# Patient Record
Sex: Male | Born: 1965 | Race: White | Hispanic: No | Marital: Married | Smoking: Former smoker
Health system: Southern US, Community
[De-identification: ages and names within clinical notes are randomized; demographics above are authoritative.]

## PROBLEM LIST (undated history)

## (undated) DIAGNOSIS — E291 Testicular hypofunction: Secondary | ICD-10-CM

## (undated) DIAGNOSIS — Q638 Other specified congenital malformations of kidney: Secondary | ICD-10-CM

## (undated) DIAGNOSIS — N529 Male erectile dysfunction, unspecified: Secondary | ICD-10-CM

## (undated) DIAGNOSIS — N400 Enlarged prostate without lower urinary tract symptoms: Secondary | ICD-10-CM

## (undated) DIAGNOSIS — Q632 Ectopic kidney: Secondary | ICD-10-CM

## (undated) DIAGNOSIS — E669 Obesity, unspecified: Secondary | ICD-10-CM

## (undated) DIAGNOSIS — K219 Gastro-esophageal reflux disease without esophagitis: Secondary | ICD-10-CM

## (undated) DIAGNOSIS — Z940 Kidney transplant status: Secondary | ICD-10-CM

## (undated) DIAGNOSIS — G4730 Sleep apnea, unspecified: Secondary | ICD-10-CM

## (undated) DIAGNOSIS — IMO0001 Reserved for inherently not codable concepts without codable children: Secondary | ICD-10-CM

## (undated) DIAGNOSIS — D472 Monoclonal gammopathy: Secondary | ICD-10-CM

## (undated) DIAGNOSIS — I10 Essential (primary) hypertension: Secondary | ICD-10-CM

## (undated) DIAGNOSIS — F524 Premature ejaculation: Secondary | ICD-10-CM

## (undated) DIAGNOSIS — N3289 Other specified disorders of bladder: Secondary | ICD-10-CM

## (undated) DIAGNOSIS — K439 Ventral hernia without obstruction or gangrene: Secondary | ICD-10-CM

## (undated) HISTORY — DX: Male erectile dysfunction, unspecified: N52.9

## (undated) HISTORY — DX: Testicular hypofunction: E29.1

## (undated) HISTORY — DX: Benign prostatic hyperplasia without lower urinary tract symptoms: N40.0

## (undated) HISTORY — DX: Other specified congenital malformations of kidney: Q63.8

## (undated) HISTORY — DX: Sleep apnea, unspecified: G47.30

## (undated) HISTORY — DX: Monoclonal gammopathy: D47.2

## (undated) HISTORY — PX: COLONOSCOPY: SHX174

## (undated) HISTORY — DX: Kidney transplant status: Z94.0

## (undated) HISTORY — DX: Obesity, unspecified: E66.9

## (undated) HISTORY — DX: Premature ejaculation: F52.4

## (undated) HISTORY — DX: Ectopic kidney: Q63.2

## (undated) HISTORY — PX: OTHER SURGICAL HISTORY: SHX169

## (undated) HISTORY — DX: Reserved for inherently not codable concepts without codable children: IMO0001

## (undated) HISTORY — DX: Other specified disorders of bladder: N32.89

## (undated) HISTORY — DX: Ventral hernia without obstruction or gangrene: K43.9

## (undated) HISTORY — DX: Essential (primary) hypertension: I10

## (undated) HISTORY — PX: BACK SURGERY: SHX140

## (undated) HISTORY — PX: SPINE SURGERY: SHX786

---

## 2005-12-04 ENCOUNTER — Ambulatory Visit: Payer: Self-pay | Admitting: Unknown Physician Specialty

## 2005-12-18 ENCOUNTER — Ambulatory Visit: Payer: Self-pay | Admitting: Unknown Physician Specialty

## 2006-01-01 ENCOUNTER — Ambulatory Visit: Payer: Self-pay | Admitting: Unknown Physician Specialty

## 2006-01-10 ENCOUNTER — Inpatient Hospital Stay: Payer: Self-pay | Admitting: Unknown Physician Specialty

## 2006-10-02 ENCOUNTER — Ambulatory Visit: Payer: Self-pay | Admitting: Physician Assistant

## 2007-01-13 ENCOUNTER — Emergency Department: Payer: Self-pay | Admitting: Emergency Medicine

## 2007-01-19 ENCOUNTER — Ambulatory Visit: Payer: Self-pay

## 2007-01-23 ENCOUNTER — Ambulatory Visit (HOSPITAL_COMMUNITY): Admission: RE | Admit: 2007-01-23 | Discharge: 2007-01-23 | Payer: Self-pay | Admitting: Neurological Surgery

## 2007-02-10 ENCOUNTER — Inpatient Hospital Stay (HOSPITAL_COMMUNITY): Admission: RE | Admit: 2007-02-10 | Discharge: 2007-02-11 | Payer: Self-pay | Admitting: Neurological Surgery

## 2007-06-24 ENCOUNTER — Ambulatory Visit: Payer: Self-pay | Admitting: Urology

## 2009-04-05 ENCOUNTER — Ambulatory Visit: Payer: Self-pay | Admitting: Unknown Physician Specialty

## 2010-06-22 ENCOUNTER — Ambulatory Visit: Payer: Self-pay | Admitting: Oncology

## 2010-06-26 ENCOUNTER — Ambulatory Visit: Payer: Self-pay | Admitting: Nephrology

## 2010-07-06 ENCOUNTER — Ambulatory Visit: Payer: Self-pay | Admitting: Oncology

## 2010-07-23 ENCOUNTER — Ambulatory Visit: Payer: Self-pay | Admitting: Oncology

## 2010-07-27 ENCOUNTER — Ambulatory Visit: Payer: Self-pay | Admitting: Nephrology

## 2010-08-23 ENCOUNTER — Ambulatory Visit: Payer: Self-pay | Admitting: Oncology

## 2010-08-24 ENCOUNTER — Ambulatory Visit: Payer: Self-pay | Admitting: Oncology

## 2010-09-05 DIAGNOSIS — I10 Essential (primary) hypertension: Secondary | ICD-10-CM | POA: Insufficient documentation

## 2010-09-22 ENCOUNTER — Ambulatory Visit: Payer: Self-pay | Admitting: Oncology

## 2010-10-23 ENCOUNTER — Ambulatory Visit: Payer: Self-pay | Admitting: Oncology

## 2010-12-29 ENCOUNTER — Ambulatory Visit: Payer: Self-pay | Admitting: Family Medicine

## 2011-02-12 DIAGNOSIS — G4733 Obstructive sleep apnea (adult) (pediatric): Secondary | ICD-10-CM | POA: Insufficient documentation

## 2011-02-12 DIAGNOSIS — E66811 Obesity, class 1: Secondary | ICD-10-CM | POA: Insufficient documentation

## 2011-02-12 DIAGNOSIS — E669 Obesity, unspecified: Secondary | ICD-10-CM | POA: Insufficient documentation

## 2011-02-12 DIAGNOSIS — E662 Morbid (severe) obesity with alveolar hypoventilation: Secondary | ICD-10-CM | POA: Insufficient documentation

## 2011-02-27 ENCOUNTER — Ambulatory Visit: Payer: Self-pay | Admitting: Family Medicine

## 2011-03-04 ENCOUNTER — Ambulatory Visit: Payer: Self-pay | Admitting: Oncology

## 2011-03-24 ENCOUNTER — Ambulatory Visit: Payer: Self-pay | Admitting: Family Medicine

## 2011-03-24 ENCOUNTER — Ambulatory Visit: Payer: Self-pay | Admitting: Oncology

## 2011-04-09 ENCOUNTER — Ambulatory Visit: Payer: Self-pay | Admitting: Vascular Surgery

## 2011-04-12 ENCOUNTER — Ambulatory Visit: Payer: Self-pay | Admitting: Vascular Surgery

## 2011-05-21 ENCOUNTER — Ambulatory Visit: Payer: Self-pay | Admitting: Vascular Surgery

## 2011-08-23 ENCOUNTER — Other Ambulatory Visit: Payer: Self-pay | Admitting: Nephrology

## 2011-12-25 DIAGNOSIS — Z7682 Awaiting organ transplant status: Secondary | ICD-10-CM | POA: Diagnosis not present

## 2011-12-25 DIAGNOSIS — Z01818 Encounter for other preprocedural examination: Secondary | ICD-10-CM | POA: Diagnosis not present

## 2011-12-25 DIAGNOSIS — N186 End stage renal disease: Secondary | ICD-10-CM | POA: Diagnosis not present

## 2011-12-30 ENCOUNTER — Ambulatory Visit: Payer: Self-pay | Admitting: Urology

## 2011-12-30 DIAGNOSIS — R109 Unspecified abdominal pain: Secondary | ICD-10-CM | POA: Diagnosis not present

## 2012-01-07 ENCOUNTER — Other Ambulatory Visit: Payer: Self-pay | Admitting: Nephrology

## 2012-01-07 DIAGNOSIS — R109 Unspecified abdominal pain: Secondary | ICD-10-CM | POA: Diagnosis not present

## 2012-01-07 DIAGNOSIS — Z0189 Encounter for other specified special examinations: Secondary | ICD-10-CM | POA: Diagnosis not present

## 2012-01-07 DIAGNOSIS — I120 Hypertensive chronic kidney disease with stage 5 chronic kidney disease or end stage renal disease: Secondary | ICD-10-CM | POA: Diagnosis not present

## 2012-01-07 DIAGNOSIS — N186 End stage renal disease: Secondary | ICD-10-CM | POA: Diagnosis not present

## 2012-01-07 DIAGNOSIS — K5730 Diverticulosis of large intestine without perforation or abscess without bleeding: Secondary | ICD-10-CM | POA: Diagnosis not present

## 2012-01-07 DIAGNOSIS — R161 Splenomegaly, not elsewhere classified: Secondary | ICD-10-CM | POA: Diagnosis not present

## 2012-01-11 LAB — BODY FLUID CULTURE

## 2012-01-13 ENCOUNTER — Other Ambulatory Visit: Payer: Self-pay

## 2012-01-17 ENCOUNTER — Ambulatory Visit: Payer: Self-pay

## 2012-01-20 DIAGNOSIS — N032 Chronic nephritic syndrome with diffuse membranous glomerulonephritis: Secondary | ICD-10-CM | POA: Diagnosis not present

## 2012-01-20 DIAGNOSIS — N186 End stage renal disease: Secondary | ICD-10-CM | POA: Diagnosis not present

## 2012-01-20 DIAGNOSIS — N189 Chronic kidney disease, unspecified: Secondary | ICD-10-CM | POA: Diagnosis not present

## 2012-01-20 DIAGNOSIS — Z01818 Encounter for other preprocedural examination: Secondary | ICD-10-CM | POA: Diagnosis not present

## 2012-01-20 DIAGNOSIS — I10 Essential (primary) hypertension: Secondary | ICD-10-CM | POA: Diagnosis not present

## 2012-01-20 DIAGNOSIS — Z01812 Encounter for preprocedural laboratory examination: Secondary | ICD-10-CM | POA: Diagnosis not present

## 2012-01-20 DIAGNOSIS — K219 Gastro-esophageal reflux disease without esophagitis: Secondary | ICD-10-CM | POA: Diagnosis not present

## 2012-01-20 DIAGNOSIS — I120 Hypertensive chronic kidney disease with stage 5 chronic kidney disease or end stage renal disease: Secondary | ICD-10-CM | POA: Diagnosis not present

## 2012-01-20 DIAGNOSIS — Z01810 Encounter for preprocedural cardiovascular examination: Secondary | ICD-10-CM | POA: Diagnosis not present

## 2012-01-20 DIAGNOSIS — Z992 Dependence on renal dialysis: Secondary | ICD-10-CM | POA: Diagnosis not present

## 2012-01-23 ENCOUNTER — Other Ambulatory Visit: Payer: Self-pay

## 2012-01-23 DIAGNOSIS — I120 Hypertensive chronic kidney disease with stage 5 chronic kidney disease or end stage renal disease: Secondary | ICD-10-CM | POA: Diagnosis not present

## 2012-01-23 DIAGNOSIS — Z992 Dependence on renal dialysis: Secondary | ICD-10-CM | POA: Diagnosis not present

## 2012-01-23 DIAGNOSIS — Z01818 Encounter for other preprocedural examination: Secondary | ICD-10-CM | POA: Diagnosis not present

## 2012-01-23 DIAGNOSIS — Z01810 Encounter for preprocedural cardiovascular examination: Secondary | ICD-10-CM | POA: Diagnosis not present

## 2012-01-23 DIAGNOSIS — Z01812 Encounter for preprocedural laboratory examination: Secondary | ICD-10-CM | POA: Diagnosis not present

## 2012-01-23 DIAGNOSIS — N186 End stage renal disease: Secondary | ICD-10-CM | POA: Diagnosis not present

## 2012-01-23 LAB — BODY FLUID CELL COUNT WITH DIFFERENTIAL: Nucleated Cell Count: 27 /mm3

## 2012-01-24 DIAGNOSIS — N186 End stage renal disease: Secondary | ICD-10-CM | POA: Diagnosis not present

## 2012-01-24 HISTORY — PX: KIDNEY TRANSPLANT: SHX239

## 2012-01-27 LAB — BODY FLUID CULTURE

## 2012-01-28 DIAGNOSIS — I120 Hypertensive chronic kidney disease with stage 5 chronic kidney disease or end stage renal disease: Secondary | ICD-10-CM | POA: Diagnosis present

## 2012-01-28 DIAGNOSIS — N186 End stage renal disease: Secondary | ICD-10-CM | POA: Diagnosis present

## 2012-01-28 DIAGNOSIS — N042 Nephrotic syndrome with diffuse membranous glomerulonephritis: Secondary | ICD-10-CM | POA: Diagnosis present

## 2012-01-28 DIAGNOSIS — Z992 Dependence on renal dialysis: Secondary | ICD-10-CM | POA: Diagnosis not present

## 2012-01-28 DIAGNOSIS — E669 Obesity, unspecified: Secondary | ICD-10-CM | POA: Diagnosis present

## 2012-01-28 DIAGNOSIS — E785 Hyperlipidemia, unspecified: Secondary | ICD-10-CM | POA: Diagnosis present

## 2012-01-28 DIAGNOSIS — G4733 Obstructive sleep apnea (adult) (pediatric): Secondary | ICD-10-CM | POA: Diagnosis present

## 2012-01-28 DIAGNOSIS — Z940 Kidney transplant status: Secondary | ICD-10-CM | POA: Insufficient documentation

## 2012-02-03 ENCOUNTER — Other Ambulatory Visit: Payer: Self-pay

## 2012-02-03 DIAGNOSIS — Z940 Kidney transplant status: Secondary | ICD-10-CM | POA: Diagnosis not present

## 2012-02-03 LAB — CBC WITH DIFFERENTIAL/PLATELET
Basophil %: 0 %
Eosinophil %: 0.3 %
HCT: 28.5 % — ABNORMAL LOW (ref 40.0–52.0)
Lymphocyte #: 0 10*3/uL — ABNORMAL LOW (ref 1.0–3.6)
MCV: 94 fL (ref 80–100)
Monocyte %: 2 %
Platelet: 195 10*3/uL (ref 150–440)
WBC: 5.4 10*3/uL (ref 3.8–10.6)

## 2012-02-03 LAB — PHOSPHORUS: Phosphorus: 1.6 mg/dL — ABNORMAL LOW (ref 2.5–4.9)

## 2012-02-03 LAB — BASIC METABOLIC PANEL
Anion Gap: 5 — ABNORMAL LOW (ref 7–16)
Chloride: 110 mmol/L — ABNORMAL HIGH (ref 98–107)
Co2: 23 mmol/L (ref 21–32)
Creatinine: 1.41 mg/dL — ABNORMAL HIGH (ref 0.60–1.30)
Osmolality: 277 (ref 275–301)

## 2012-02-03 LAB — MAGNESIUM: Magnesium: 1.7 mg/dL — ABNORMAL LOW

## 2012-02-05 DIAGNOSIS — Z940 Kidney transplant status: Secondary | ICD-10-CM | POA: Diagnosis not present

## 2012-02-05 LAB — BASIC METABOLIC PANEL
Creatinine: 1.25 mg/dL (ref 0.60–1.30)
EGFR (Non-African Amer.): >60
Glucose: 86 mg/dL (ref 65–99)
Osmolality: 277 (ref 275–301)

## 2012-02-05 LAB — CBC WITH DIFFERENTIAL/PLATELET
Basophil %: 0 %
Eosinophil #: 0 10*3/uL (ref 0.0–0.7)
Eosinophil %: 0.4 %
HCT: 29.1 % — ABNORMAL LOW (ref 40.0–52.0)
HGB: 9.8 g/dL — ABNORMAL LOW (ref 13.0–18.0)
MCV: 95 fL (ref 80–100)
Monocyte #: 0.2 10*3/uL (ref 0.0–0.7)
Monocyte %: 3.5 %
Platelet: 203 10*3/uL (ref 150–440)
RBC: 3.05 10*6/uL — ABNORMAL LOW (ref 4.40–5.90)
WBC: 5 10*3/uL (ref 3.8–10.6)

## 2012-02-05 LAB — MAGNESIUM: Magnesium: 1.4 mg/dL — ABNORMAL LOW

## 2012-02-06 DIAGNOSIS — T8640 Unspecified complication of liver transplant: Secondary | ICD-10-CM | POA: Diagnosis not present

## 2012-02-06 DIAGNOSIS — Z79899 Other long term (current) drug therapy: Secondary | ICD-10-CM | POA: Diagnosis not present

## 2012-02-06 DIAGNOSIS — Z298 Encounter for other specified prophylactic measures: Secondary | ICD-10-CM | POA: Diagnosis not present

## 2012-02-06 DIAGNOSIS — Z5181 Encounter for therapeutic drug level monitoring: Secondary | ICD-10-CM | POA: Diagnosis not present

## 2012-02-06 DIAGNOSIS — E875 Hyperkalemia: Secondary | ICD-10-CM | POA: Diagnosis not present

## 2012-02-06 DIAGNOSIS — Z940 Kidney transplant status: Secondary | ICD-10-CM | POA: Diagnosis not present

## 2012-02-06 DIAGNOSIS — Z48298 Encounter for aftercare following other organ transplant: Secondary | ICD-10-CM | POA: Diagnosis not present

## 2012-02-06 DIAGNOSIS — I10 Essential (primary) hypertension: Secondary | ICD-10-CM | POA: Diagnosis not present

## 2012-02-07 DIAGNOSIS — Z940 Kidney transplant status: Secondary | ICD-10-CM | POA: Diagnosis not present

## 2012-02-07 LAB — CBC WITH DIFFERENTIAL/PLATELET
Basophil %: 0 %
Eosinophil %: 0.4 %
HCT: 29 % — ABNORMAL LOW (ref 40.0–52.0)
Lymphocyte #: 0.1 10*3/uL — ABNORMAL LOW (ref 1.0–3.6)
MCH: 31.9 pg (ref 26.0–34.0)
MCV: 96 fL (ref 80–100)
Monocyte #: 0.2 10*3/uL (ref 0.0–0.7)
Neutrophil #: 3.9 10*3/uL (ref 1.4–6.5)
Platelet: 217 10*3/uL (ref 150–440)

## 2012-02-07 LAB — BASIC METABOLIC PANEL
Anion Gap: 7 (ref 7–16)
Calcium, Total: 8.8 mg/dL (ref 8.5–10.1)
Co2: 25 mmol/L (ref 21–32)
EGFR (African American): >60
Osmolality: 282 (ref 275–301)

## 2012-02-07 LAB — PHOSPHORUS: Phosphorus: 1.8 mg/dL — ABNORMAL LOW (ref 2.5–4.9)

## 2012-02-07 LAB — MAGNESIUM: Magnesium: 1.4 mg/dL — ABNORMAL LOW

## 2012-02-10 DIAGNOSIS — Z5181 Encounter for therapeutic drug level monitoring: Secondary | ICD-10-CM | POA: Diagnosis not present

## 2012-02-10 DIAGNOSIS — Z79899 Other long term (current) drug therapy: Secondary | ICD-10-CM | POA: Diagnosis not present

## 2012-02-10 DIAGNOSIS — Z940 Kidney transplant status: Secondary | ICD-10-CM | POA: Diagnosis not present

## 2012-02-10 LAB — CBC WITH DIFFERENTIAL/PLATELET
Basophil %: 0.1 %
Eosinophil %: 0.6 %
HGB: 9.6 g/dL — ABNORMAL LOW (ref 13.0–18.0)
Lymphocyte #: 0.1 10*3/uL — ABNORMAL LOW (ref 1.0–3.6)
MCH: 32.3 pg (ref 26.0–34.0)
MCV: 94 fL (ref 80–100)
Monocyte #: 0.2 10*3/uL (ref 0.0–0.7)
Neutrophil #: 4.4 10*3/uL (ref 1.4–6.5)
RBC: 2.99 10*6/uL — ABNORMAL LOW (ref 4.40–5.90)

## 2012-02-10 LAB — BASIC METABOLIC PANEL
BUN: 21 mg/dL — ABNORMAL HIGH (ref 7–18)
Chloride: 106 mmol/L (ref 98–107)
EGFR (Non-African Amer.): 46 — ABNORMAL LOW
Glucose: 101 mg/dL — ABNORMAL HIGH (ref 65–99)
Osmolality: 279 (ref 275–301)
Potassium: 5 mmol/L (ref 3.5–5.1)
Sodium: 138 mmol/L (ref 136–145)

## 2012-02-12 DIAGNOSIS — Z940 Kidney transplant status: Secondary | ICD-10-CM | POA: Diagnosis not present

## 2012-02-12 LAB — PHOSPHORUS: Phosphorus: 2.1 mg/dL — ABNORMAL LOW (ref 2.5–4.9)

## 2012-02-12 LAB — BASIC METABOLIC PANEL
BUN: 19 mg/dL — ABNORMAL HIGH (ref 7–18)
EGFR (African American): 43 — ABNORMAL LOW
EGFR (Non-African Amer.): 35 — ABNORMAL LOW
Glucose: 90 mg/dL (ref 65–99)
Osmolality: 276 (ref 275–301)
Potassium: 5.6 mmol/L — ABNORMAL HIGH (ref 3.5–5.1)
Sodium: 137 mmol/L (ref 136–145)

## 2012-02-12 LAB — CBC WITH DIFFERENTIAL/PLATELET
Basophil #: 0 10*3/uL (ref 0.0–0.1)
Eosinophil %: 1.3 %
HCT: 27.2 % — ABNORMAL LOW (ref 40.0–52.0)
Lymphocyte %: 2.3 %
MCH: 31.8 pg (ref 26.0–34.0)
MCV: 93 fL (ref 80–100)
Monocyte %: 6.8 %
Neutrophil #: 2.9 10*3/uL (ref 1.4–6.5)
Neutrophil %: 88.9 %
Platelet: 222 10*3/uL (ref 150–440)
RBC: 2.91 10*6/uL — ABNORMAL LOW (ref 4.40–5.90)
RDW: 13.6 % (ref 11.5–14.5)

## 2012-02-12 LAB — MAGNESIUM: Magnesium: 1.5 mg/dL — ABNORMAL LOW

## 2012-02-14 DIAGNOSIS — Z940 Kidney transplant status: Secondary | ICD-10-CM | POA: Diagnosis not present

## 2012-02-14 LAB — BASIC METABOLIC PANEL
Anion Gap: 11 (ref 7–16)
BUN: 25 mg/dL — ABNORMAL HIGH (ref 7–18)
Creatinine: 2.17 mg/dL — ABNORMAL HIGH (ref 0.60–1.30)
EGFR (African American): 42 — ABNORMAL LOW
EGFR (Non-African Amer.): 35 — ABNORMAL LOW
Glucose: 102 mg/dL — ABNORMAL HIGH (ref 65–99)
Sodium: 140 mmol/L (ref 136–145)

## 2012-02-14 LAB — CBC WITH DIFFERENTIAL/PLATELET
Basophil #: 0 10*3/uL (ref 0.0–0.1)
Basophil %: 1.1 %
Eosinophil #: 0.1 10*3/uL (ref 0.0–0.7)
Eosinophil %: 2.1 %
HCT: 27.5 % — ABNORMAL LOW (ref 40.0–52.0)
HGB: 9.3 g/dL — ABNORMAL LOW (ref 13.0–18.0)
Lymphocyte %: 4.7 %
MCH: 31.8 pg (ref 26.0–34.0)
MCHC: 33.9 g/dL (ref 32.0–36.0)
Neutrophil #: 2.3 10*3/uL (ref 1.4–6.5)
Neutrophil %: 84.4 %
RBC: 2.93 10*6/uL — ABNORMAL LOW (ref 4.40–5.90)

## 2012-02-17 DIAGNOSIS — Z940 Kidney transplant status: Secondary | ICD-10-CM | POA: Diagnosis not present

## 2012-02-17 LAB — CBC WITH DIFFERENTIAL/PLATELET
Basophil #: 0 10*3/uL (ref 0.0–0.1)
Eosinophil #: 0.1 10*3/uL (ref 0.0–0.7)
Lymphocyte #: 0.1 10*3/uL — ABNORMAL LOW (ref 1.0–3.6)
Lymphocyte %: 4.2 %
MCHC: 34.1 g/dL (ref 32.0–36.0)
MCV: 93 fL (ref 80–100)
Monocyte %: 5.4 %
Neutrophil %: 87 %
Platelet: 310 10*3/uL (ref 150–440)
RBC: 3.25 10*6/uL — ABNORMAL LOW (ref 4.40–5.90)
RDW: 13.2 % (ref 11.5–14.5)
WBC: 2.8 10*3/uL — ABNORMAL LOW (ref 3.8–10.6)

## 2012-02-17 LAB — BASIC METABOLIC PANEL
Calcium, Total: 9.6 mg/dL (ref 8.5–10.1)
Chloride: 108 mmol/L — ABNORMAL HIGH (ref 98–107)
Co2: 24 mmol/L (ref 21–32)
Creatinine: 1.8 mg/dL — ABNORMAL HIGH (ref 0.60–1.30)
EGFR (African American): 53 — ABNORMAL LOW
Potassium: 6 mmol/L — ABNORMAL HIGH (ref 3.5–5.1)
Sodium: 138 mmol/L (ref 136–145)

## 2012-02-17 LAB — PHOSPHORUS: Phosphorus: 2.1 mg/dL — ABNORMAL LOW (ref 2.5–4.9)

## 2012-02-18 DIAGNOSIS — R109 Unspecified abdominal pain: Secondary | ICD-10-CM | POA: Diagnosis not present

## 2012-02-18 DIAGNOSIS — E291 Testicular hypofunction: Secondary | ICD-10-CM | POA: Diagnosis not present

## 2012-02-18 DIAGNOSIS — G8918 Other acute postprocedural pain: Secondary | ICD-10-CM | POA: Diagnosis not present

## 2012-02-19 DIAGNOSIS — Z940 Kidney transplant status: Secondary | ICD-10-CM | POA: Diagnosis not present

## 2012-02-19 LAB — COMPREHENSIVE METABOLIC PANEL
Albumin: 3.6 g/dL (ref 3.4–5.0)
Alkaline Phosphatase: 79 U/L (ref 50–136)
Bilirubin,Total: 0.3 mg/dL (ref 0.2–1.0)
Calcium, Total: 9 mg/dL (ref 8.5–10.1)
Chloride: 105 mmol/L (ref 98–107)
Co2: 24 mmol/L (ref 21–32)
EGFR (Non-African Amer.): 39 — ABNORMAL LOW
Glucose: 84 mg/dL (ref 65–99)
Osmolality: 281 (ref 275–301)
Potassium: 5.2 mmol/L — ABNORMAL HIGH (ref 3.5–5.1)
SGOT(AST): 14 U/L — ABNORMAL LOW (ref 15–37)
Total Protein: 7 g/dL (ref 6.4–8.2)

## 2012-02-19 LAB — CBC WITH DIFFERENTIAL/PLATELET
Basophil #: 0 10*3/uL (ref 0.0–0.1)
Basophil %: 0.3 %
Eosinophil #: 0.1 10*3/uL (ref 0.0–0.7)
HCT: 28.4 % — ABNORMAL LOW (ref 40.0–52.0)
HGB: 9.4 g/dL — ABNORMAL LOW (ref 13.0–18.0)
Lymphocyte #: 0.1 10*3/uL — ABNORMAL LOW (ref 1.0–3.6)
Lymphocyte %: 3.7 %
MCV: 93 fL (ref 80–100)
Monocyte #: 0.2 10*3/uL (ref 0.0–0.7)
Monocyte %: 7 %
Neutrophil #: 2.2 10*3/uL (ref 1.4–6.5)
Neutrophil %: 84.4 %
WBC: 2.6 10*3/uL — ABNORMAL LOW (ref 3.8–10.6)

## 2012-02-20 DIAGNOSIS — Z298 Encounter for other specified prophylactic measures: Secondary | ICD-10-CM | POA: Diagnosis not present

## 2012-02-20 DIAGNOSIS — Z48298 Encounter for aftercare following other organ transplant: Secondary | ICD-10-CM | POA: Diagnosis not present

## 2012-02-20 DIAGNOSIS — Z940 Kidney transplant status: Secondary | ICD-10-CM | POA: Diagnosis not present

## 2012-02-20 DIAGNOSIS — Z79899 Other long term (current) drug therapy: Secondary | ICD-10-CM | POA: Diagnosis not present

## 2012-02-20 DIAGNOSIS — Z5181 Encounter for therapeutic drug level monitoring: Secondary | ICD-10-CM | POA: Diagnosis not present

## 2012-02-21 ENCOUNTER — Other Ambulatory Visit: Payer: Self-pay

## 2012-02-21 DIAGNOSIS — Z940 Kidney transplant status: Secondary | ICD-10-CM | POA: Diagnosis not present

## 2012-02-21 LAB — CBC WITH DIFFERENTIAL/PLATELET
Eosinophil #: 0.1 10*3/uL (ref 0.0–0.7)
Eosinophil %: 3.4 %
HCT: 30 % — ABNORMAL LOW (ref 40.0–52.0)
Lymphocyte #: 0.1 10*3/uL — ABNORMAL LOW (ref 1.0–3.6)
Lymphocyte %: 3.9 %
MCV: 93 fL (ref 80–100)
Monocyte %: 5.6 %
Platelet: 321 10*3/uL (ref 150–440)
RBC: 3.25 10*6/uL — ABNORMAL LOW (ref 4.40–5.90)
RDW: 12.7 % (ref 11.5–14.5)

## 2012-02-21 LAB — BASIC METABOLIC PANEL
Anion Gap: 10 (ref 7–16)
BUN: 18 mg/dL (ref 7–18)
Calcium, Total: 9 mg/dL (ref 8.5–10.1)
Chloride: 105 mmol/L (ref 98–107)
Co2: 25 mmol/L (ref 21–32)
Creatinine: 2 mg/dL — ABNORMAL HIGH (ref 0.60–1.30)
EGFR (African American): 47 — ABNORMAL LOW
Glucose: 87 mg/dL (ref 65–99)
Osmolality: 281 (ref 275–301)

## 2012-02-21 LAB — PHOSPHORUS: Phosphorus: 1.7 mg/dL — ABNORMAL LOW (ref 2.5–4.9)

## 2012-02-21 LAB — MAGNESIUM: Magnesium: 1.6 mg/dL — ABNORMAL LOW

## 2012-02-24 DIAGNOSIS — Z940 Kidney transplant status: Secondary | ICD-10-CM | POA: Diagnosis not present

## 2012-02-24 LAB — CBC WITH DIFFERENTIAL/PLATELET
Basophil #: 0 10*3/uL (ref 0.0–0.1)
Basophil %: 0.6 %
Eosinophil #: 0.1 10*3/uL (ref 0.0–0.7)
Eosinophil %: 2.3 %
HCT: 31.4 % — ABNORMAL LOW (ref 40.0–52.0)
HGB: 10.5 g/dL — ABNORMAL LOW (ref 13.0–18.0)
Lymphocyte #: 0.1 10*3/uL — ABNORMAL LOW (ref 1.0–3.6)
Lymphocyte %: 2.5 %
MCHC: 33.4 g/dL (ref 32.0–36.0)
MCV: 94 fL (ref 80–100)
Monocyte %: 4.9 %
Neutrophil #: 4.4 10*3/uL (ref 1.4–6.5)
RDW: 13.9 % (ref 11.5–14.5)
WBC: 4.9 10*3/uL (ref 3.8–10.6)

## 2012-02-24 LAB — BASIC METABOLIC PANEL
Chloride: 108 mmol/L — ABNORMAL HIGH (ref 98–107)
Co2: 23 mmol/L (ref 21–32)
Creatinine: 1.57 mg/dL — ABNORMAL HIGH (ref 0.60–1.30)
EGFR (African American): >60
Osmolality: 276 (ref 275–301)
Sodium: 138 mmol/L (ref 136–145)

## 2012-02-24 LAB — PHOSPHORUS: Phosphorus: 1.6 mg/dL — ABNORMAL LOW (ref 2.5–4.9)

## 2012-02-24 LAB — MAGNESIUM: Magnesium: 1.7 mg/dL — ABNORMAL LOW

## 2012-02-26 DIAGNOSIS — Z940 Kidney transplant status: Secondary | ICD-10-CM | POA: Diagnosis not present

## 2012-02-26 LAB — BASIC METABOLIC PANEL
BUN: 17 mg/dL (ref 7–18)
Calcium, Total: 9.2 mg/dL (ref 8.5–10.1)
Chloride: 109 mmol/L — ABNORMAL HIGH (ref 98–107)
Glucose: 94 mg/dL (ref 65–99)
Osmolality: 281 (ref 275–301)
Potassium: 5.4 mmol/L — ABNORMAL HIGH (ref 3.5–5.1)
Sodium: 140 mmol/L (ref 136–145)

## 2012-02-26 LAB — CBC WITH DIFFERENTIAL/PLATELET
Basophil %: 0.4 %
Eosinophil #: 0.1 10*3/uL (ref 0.0–0.7)
HCT: 31.8 % — ABNORMAL LOW (ref 40.0–52.0)
Lymphocyte #: 0.2 10*3/uL — ABNORMAL LOW (ref 1.0–3.6)
MCH: 31.5 pg (ref 26.0–34.0)
MCHC: 33.7 g/dL (ref 32.0–36.0)
MCV: 93 fL (ref 80–100)
Monocyte #: 0.2 10*3/uL (ref 0.0–0.7)
Monocyte %: 6.6 %
Neutrophil #: 2.9 10*3/uL (ref 1.4–6.5)
Neutrophil %: 84.3 %
Platelet: 276 10*3/uL (ref 150–440)
RDW: 14.1 % (ref 11.5–14.5)

## 2012-02-26 LAB — MAGNESIUM: Magnesium: 1.5 mg/dL — ABNORMAL LOW

## 2012-02-26 LAB — PHOSPHORUS: Phosphorus: 1.4 mg/dL — ABNORMAL LOW (ref 2.5–4.9)

## 2012-02-28 LAB — CBC WITH DIFFERENTIAL/PLATELET
Basophil #: 0 10*3/uL (ref 0.0–0.1)
Basophil %: 0.3 %
Eosinophil #: 0.1 10*3/uL (ref 0.0–0.7)
HCT: 33.2 % — ABNORMAL LOW (ref 40.0–52.0)
HGB: 11.3 g/dL — ABNORMAL LOW (ref 13.0–18.0)
Lymphocyte #: 0.2 10*3/uL — ABNORMAL LOW (ref 1.0–3.6)
Lymphocyte %: 4.4 %
MCHC: 33.9 g/dL (ref 32.0–36.0)
MCV: 93 fL (ref 80–100)
Monocyte #: 0.2 10*3/uL (ref 0.0–0.7)
Monocyte %: 4.9 %
Neutrophil #: 3.6 10*3/uL (ref 1.4–6.5)
Neutrophil %: 88.7 %
RBC: 3.58 10*6/uL — ABNORMAL LOW (ref 4.40–5.90)
WBC: 4.1 10*3/uL (ref 3.8–10.6)

## 2012-02-28 LAB — BASIC METABOLIC PANEL
Anion Gap: 8 (ref 7–16)
BUN: 19 mg/dL — ABNORMAL HIGH (ref 7–18)
Calcium, Total: 9.2 mg/dL (ref 8.5–10.1)
Chloride: 108 mmol/L — ABNORMAL HIGH (ref 98–107)
Co2: 25 mmol/L (ref 21–32)
Osmolality: 283 (ref 275–301)
Potassium: 5.9 mmol/L — ABNORMAL HIGH (ref 3.5–5.1)

## 2012-02-28 LAB — PHOSPHORUS: Phosphorus: 1.6 mg/dL — ABNORMAL LOW (ref 2.5–4.9)

## 2012-03-02 DIAGNOSIS — Z940 Kidney transplant status: Secondary | ICD-10-CM | POA: Diagnosis not present

## 2012-03-02 LAB — BASIC METABOLIC PANEL
Anion Gap: 10 (ref 7–16)
Calcium, Total: 9 mg/dL (ref 8.5–10.1)
Chloride: 107 mmol/L (ref 98–107)
Co2: 23 mmol/L (ref 21–32)
Creatinine: 1.69 mg/dL — ABNORMAL HIGH (ref 0.60–1.30)
Glucose: 100 mg/dL — ABNORMAL HIGH (ref 65–99)
Osmolality: 282 (ref 275–301)
Sodium: 140 mmol/L (ref 136–145)

## 2012-03-02 LAB — CBC WITH DIFFERENTIAL/PLATELET
Basophil #: 0 10*3/uL (ref 0.0–0.1)
Basophil %: 0.2 %
Eosinophil #: 0.1 10*3/uL (ref 0.0–0.7)
Eosinophil %: 1.7 %
HCT: 33 % — ABNORMAL LOW (ref 40.0–52.0)
Lymphocyte #: 0.2 10*3/uL — ABNORMAL LOW (ref 1.0–3.6)
Lymphocyte %: 4.6 %
MCHC: 33.5 g/dL (ref 32.0–36.0)
MCV: 94 fL (ref 80–100)
Neutrophil #: 3.5 10*3/uL (ref 1.4–6.5)
RDW: 14 % (ref 11.5–14.5)
WBC: 4 10*3/uL (ref 3.8–10.6)

## 2012-03-02 LAB — MAGNESIUM: Magnesium: 1.8 mg/dL

## 2012-03-04 DIAGNOSIS — Z940 Kidney transplant status: Secondary | ICD-10-CM | POA: Diagnosis not present

## 2012-03-04 LAB — CBC WITH DIFFERENTIAL/PLATELET
Eosinophil %: 1.7 %
HCT: 35.7 % — ABNORMAL LOW (ref 40.0–52.0)
Monocyte %: 6.4 %
Neutrophil #: 4.3 10*3/uL (ref 1.4–6.5)
Neutrophil %: 87.9 %
Platelet: 240 10*3/uL (ref 150–440)
RBC: 3.81 10*6/uL — ABNORMAL LOW (ref 4.40–5.90)
WBC: 4.9 10*3/uL (ref 3.8–10.6)

## 2012-03-04 LAB — BASIC METABOLIC PANEL
Anion Gap: 6 — ABNORMAL LOW (ref 7–16)
BUN: 20 mg/dL — ABNORMAL HIGH (ref 7–18)
Calcium, Total: 9.3 mg/dL (ref 8.5–10.1)
Co2: 24 mmol/L (ref 21–32)
Creatinine: 1.73 mg/dL — ABNORMAL HIGH (ref 0.60–1.30)
EGFR (African American): 55 — ABNORMAL LOW
EGFR (Non-African Amer.): 45 — ABNORMAL LOW
Glucose: 95 mg/dL (ref 65–99)
Osmolality: 276 (ref 275–301)

## 2012-03-04 LAB — MAGNESIUM: Magnesium: 1.8 mg/dL

## 2012-03-06 DIAGNOSIS — Z4902 Encounter for fitting and adjustment of peritoneal dialysis catheter: Secondary | ICD-10-CM | POA: Diagnosis not present

## 2012-03-06 DIAGNOSIS — Z940 Kidney transplant status: Secondary | ICD-10-CM | POA: Diagnosis not present

## 2012-03-06 DIAGNOSIS — T190XXA Foreign body in urethra, initial encounter: Secondary | ICD-10-CM | POA: Diagnosis not present

## 2012-03-06 DIAGNOSIS — Z48298 Encounter for aftercare following other organ transplant: Secondary | ICD-10-CM | POA: Diagnosis not present

## 2012-03-06 DIAGNOSIS — Z466 Encounter for fitting and adjustment of urinary device: Secondary | ICD-10-CM | POA: Diagnosis not present

## 2012-03-09 DIAGNOSIS — Z940 Kidney transplant status: Secondary | ICD-10-CM | POA: Diagnosis not present

## 2012-03-09 LAB — BASIC METABOLIC PANEL
Calcium, Total: 9 mg/dL (ref 8.5–10.1)
Chloride: 105 mmol/L (ref 98–107)
Co2: 23 mmol/L (ref 21–32)
EGFR (African American): >60
EGFR (Non-African Amer.): 52 — ABNORMAL LOW
Osmolality: 283 (ref 275–301)
Potassium: 5.3 mmol/L — ABNORMAL HIGH (ref 3.5–5.1)
Sodium: 139 mmol/L (ref 136–145)

## 2012-03-09 LAB — CBC WITH DIFFERENTIAL/PLATELET
Basophil #: 0 10*3/uL (ref 0.0–0.1)
Eosinophil #: 0.1 10*3/uL (ref 0.0–0.7)
Eosinophil %: 3.7 %
HCT: 35.3 % — ABNORMAL LOW (ref 40.0–52.0)
HGB: 11.8 g/dL — ABNORMAL LOW (ref 13.0–18.0)
MCH: 31 pg (ref 26.0–34.0)
MCHC: 33.4 g/dL (ref 32.0–36.0)
Monocyte #: 0.2 10*3/uL (ref 0.0–0.7)
Neutrophil %: 83.2 %
Platelet: 219 10*3/uL (ref 150–440)
RDW: 13.3 % (ref 11.5–14.5)

## 2012-03-09 LAB — PHOSPHORUS: Phosphorus: 1.7 mg/dL — ABNORMAL LOW (ref 2.5–4.9)

## 2012-03-09 LAB — MAGNESIUM: Magnesium: 1.6 mg/dL — ABNORMAL LOW

## 2012-03-11 DIAGNOSIS — Z5181 Encounter for therapeutic drug level monitoring: Secondary | ICD-10-CM | POA: Diagnosis not present

## 2012-03-11 DIAGNOSIS — N042 Nephrotic syndrome with diffuse membranous glomerulonephritis: Secondary | ICD-10-CM | POA: Diagnosis not present

## 2012-03-11 DIAGNOSIS — I10 Essential (primary) hypertension: Secondary | ICD-10-CM | POA: Diagnosis not present

## 2012-03-11 DIAGNOSIS — Z48298 Encounter for aftercare following other organ transplant: Secondary | ICD-10-CM | POA: Diagnosis not present

## 2012-03-11 DIAGNOSIS — E875 Hyperkalemia: Secondary | ICD-10-CM | POA: Diagnosis not present

## 2012-03-11 DIAGNOSIS — N039 Chronic nephritic syndrome with unspecified morphologic changes: Secondary | ICD-10-CM | POA: Diagnosis not present

## 2012-03-11 DIAGNOSIS — Z940 Kidney transplant status: Secondary | ICD-10-CM | POA: Diagnosis not present

## 2012-03-11 DIAGNOSIS — D509 Iron deficiency anemia, unspecified: Secondary | ICD-10-CM | POA: Diagnosis not present

## 2012-03-11 DIAGNOSIS — Z79899 Other long term (current) drug therapy: Secondary | ICD-10-CM | POA: Diagnosis not present

## 2012-03-11 DIAGNOSIS — E785 Hyperlipidemia, unspecified: Secondary | ICD-10-CM | POA: Diagnosis not present

## 2012-03-16 DIAGNOSIS — N186 End stage renal disease: Secondary | ICD-10-CM | POA: Diagnosis not present

## 2012-03-16 DIAGNOSIS — Z7682 Awaiting organ transplant status: Secondary | ICD-10-CM | POA: Diagnosis not present

## 2012-03-16 DIAGNOSIS — Z01818 Encounter for other preprocedural examination: Secondary | ICD-10-CM | POA: Diagnosis not present

## 2012-03-19 DIAGNOSIS — Z940 Kidney transplant status: Secondary | ICD-10-CM | POA: Diagnosis not present

## 2012-03-19 DIAGNOSIS — R109 Unspecified abdominal pain: Secondary | ICD-10-CM | POA: Diagnosis not present

## 2012-03-19 DIAGNOSIS — E291 Testicular hypofunction: Secondary | ICD-10-CM | POA: Diagnosis not present

## 2012-03-19 DIAGNOSIS — G8918 Other acute postprocedural pain: Secondary | ICD-10-CM | POA: Diagnosis not present

## 2012-03-23 ENCOUNTER — Other Ambulatory Visit: Payer: Self-pay

## 2012-03-23 DIAGNOSIS — Z48298 Encounter for aftercare following other organ transplant: Secondary | ICD-10-CM | POA: Diagnosis not present

## 2012-03-23 DIAGNOSIS — Z940 Kidney transplant status: Secondary | ICD-10-CM | POA: Diagnosis not present

## 2012-03-30 DIAGNOSIS — Z940 Kidney transplant status: Secondary | ICD-10-CM | POA: Diagnosis not present

## 2012-04-01 DIAGNOSIS — M542 Cervicalgia: Secondary | ICD-10-CM | POA: Diagnosis not present

## 2012-04-02 DIAGNOSIS — Z48298 Encounter for aftercare following other organ transplant: Secondary | ICD-10-CM | POA: Diagnosis not present

## 2012-04-02 DIAGNOSIS — Z940 Kidney transplant status: Secondary | ICD-10-CM | POA: Diagnosis not present

## 2012-04-06 DIAGNOSIS — Z940 Kidney transplant status: Secondary | ICD-10-CM | POA: Diagnosis not present

## 2012-04-08 DIAGNOSIS — N186 End stage renal disease: Secondary | ICD-10-CM | POA: Diagnosis not present

## 2012-04-08 DIAGNOSIS — D472 Monoclonal gammopathy: Secondary | ICD-10-CM | POA: Diagnosis not present

## 2012-04-08 DIAGNOSIS — Z7982 Long term (current) use of aspirin: Secondary | ICD-10-CM | POA: Diagnosis not present

## 2012-04-08 DIAGNOSIS — G4733 Obstructive sleep apnea (adult) (pediatric): Secondary | ICD-10-CM | POA: Diagnosis not present

## 2012-04-08 DIAGNOSIS — Z79899 Other long term (current) drug therapy: Secondary | ICD-10-CM | POA: Diagnosis not present

## 2012-04-08 DIAGNOSIS — I120 Hypertensive chronic kidney disease with stage 5 chronic kidney disease or end stage renal disease: Secondary | ICD-10-CM | POA: Diagnosis not present

## 2012-04-14 DIAGNOSIS — I10 Essential (primary) hypertension: Secondary | ICD-10-CM | POA: Diagnosis not present

## 2012-04-14 DIAGNOSIS — N182 Chronic kidney disease, stage 2 (mild): Secondary | ICD-10-CM | POA: Diagnosis not present

## 2012-04-14 DIAGNOSIS — Z940 Kidney transplant status: Secondary | ICD-10-CM | POA: Diagnosis not present

## 2012-04-16 DIAGNOSIS — E291 Testicular hypofunction: Secondary | ICD-10-CM | POA: Diagnosis not present

## 2012-04-16 DIAGNOSIS — R109 Unspecified abdominal pain: Secondary | ICD-10-CM | POA: Diagnosis not present

## 2012-04-16 DIAGNOSIS — N4889 Other specified disorders of penis: Secondary | ICD-10-CM | POA: Diagnosis not present

## 2012-04-16 DIAGNOSIS — N329 Bladder disorder, unspecified: Secondary | ICD-10-CM | POA: Diagnosis not present

## 2012-04-21 DIAGNOSIS — Z940 Kidney transplant status: Secondary | ICD-10-CM | POA: Diagnosis not present

## 2012-04-21 DIAGNOSIS — Z79899 Other long term (current) drug therapy: Secondary | ICD-10-CM | POA: Diagnosis not present

## 2012-04-28 DIAGNOSIS — Z940 Kidney transplant status: Secondary | ICD-10-CM | POA: Diagnosis not present

## 2012-04-28 DIAGNOSIS — Z79899 Other long term (current) drug therapy: Secondary | ICD-10-CM | POA: Diagnosis not present

## 2012-05-06 DIAGNOSIS — Z79899 Other long term (current) drug therapy: Secondary | ICD-10-CM | POA: Diagnosis not present

## 2012-05-06 DIAGNOSIS — Z5181 Encounter for therapeutic drug level monitoring: Secondary | ICD-10-CM | POA: Diagnosis not present

## 2012-05-06 DIAGNOSIS — Z940 Kidney transplant status: Secondary | ICD-10-CM | POA: Diagnosis not present

## 2012-05-12 DIAGNOSIS — Z940 Kidney transplant status: Secondary | ICD-10-CM | POA: Diagnosis not present

## 2012-05-12 DIAGNOSIS — Z79899 Other long term (current) drug therapy: Secondary | ICD-10-CM | POA: Diagnosis not present

## 2012-05-19 DIAGNOSIS — I10 Essential (primary) hypertension: Secondary | ICD-10-CM | POA: Diagnosis not present

## 2012-05-19 DIAGNOSIS — N182 Chronic kidney disease, stage 2 (mild): Secondary | ICD-10-CM | POA: Diagnosis not present

## 2012-05-19 DIAGNOSIS — Z79899 Other long term (current) drug therapy: Secondary | ICD-10-CM | POA: Diagnosis not present

## 2012-05-19 DIAGNOSIS — Z940 Kidney transplant status: Secondary | ICD-10-CM | POA: Diagnosis not present

## 2012-05-26 DIAGNOSIS — Z5181 Encounter for therapeutic drug level monitoring: Secondary | ICD-10-CM | POA: Diagnosis not present

## 2012-05-26 DIAGNOSIS — Z940 Kidney transplant status: Secondary | ICD-10-CM | POA: Diagnosis not present

## 2012-05-26 DIAGNOSIS — Z79899 Other long term (current) drug therapy: Secondary | ICD-10-CM | POA: Diagnosis not present

## 2012-06-11 DIAGNOSIS — Z940 Kidney transplant status: Secondary | ICD-10-CM | POA: Diagnosis not present

## 2012-06-11 DIAGNOSIS — Z79899 Other long term (current) drug therapy: Secondary | ICD-10-CM | POA: Diagnosis not present

## 2012-06-16 DIAGNOSIS — E291 Testicular hypofunction: Secondary | ICD-10-CM | POA: Diagnosis not present

## 2012-06-16 DIAGNOSIS — Z125 Encounter for screening for malignant neoplasm of prostate: Secondary | ICD-10-CM | POA: Diagnosis not present

## 2012-06-16 DIAGNOSIS — N329 Bladder disorder, unspecified: Secondary | ICD-10-CM | POA: Diagnosis not present

## 2012-06-16 DIAGNOSIS — N4889 Other specified disorders of penis: Secondary | ICD-10-CM | POA: Diagnosis not present

## 2012-06-30 DIAGNOSIS — Z940 Kidney transplant status: Secondary | ICD-10-CM | POA: Diagnosis not present

## 2012-06-30 DIAGNOSIS — Z79899 Other long term (current) drug therapy: Secondary | ICD-10-CM | POA: Diagnosis not present

## 2012-07-15 DIAGNOSIS — N4889 Other specified disorders of penis: Secondary | ICD-10-CM | POA: Diagnosis not present

## 2012-07-15 DIAGNOSIS — N329 Bladder disorder, unspecified: Secondary | ICD-10-CM | POA: Diagnosis not present

## 2012-07-15 DIAGNOSIS — Z125 Encounter for screening for malignant neoplasm of prostate: Secondary | ICD-10-CM | POA: Diagnosis not present

## 2012-07-15 DIAGNOSIS — E291 Testicular hypofunction: Secondary | ICD-10-CM | POA: Diagnosis not present

## 2012-07-21 DIAGNOSIS — Z940 Kidney transplant status: Secondary | ICD-10-CM | POA: Diagnosis not present

## 2012-07-21 DIAGNOSIS — Z79899 Other long term (current) drug therapy: Secondary | ICD-10-CM | POA: Diagnosis not present

## 2012-08-04 DIAGNOSIS — Z79899 Other long term (current) drug therapy: Secondary | ICD-10-CM | POA: Diagnosis not present

## 2012-08-04 DIAGNOSIS — Z940 Kidney transplant status: Secondary | ICD-10-CM | POA: Diagnosis not present

## 2012-08-10 DIAGNOSIS — E291 Testicular hypofunction: Secondary | ICD-10-CM | POA: Diagnosis not present

## 2012-08-26 DIAGNOSIS — M6740 Ganglion, unspecified site: Secondary | ICD-10-CM | POA: Diagnosis not present

## 2012-08-31 DIAGNOSIS — E291 Testicular hypofunction: Secondary | ICD-10-CM | POA: Diagnosis not present

## 2012-09-03 ENCOUNTER — Ambulatory Visit: Payer: Self-pay | Admitting: Orthopedic Surgery

## 2012-09-09 DIAGNOSIS — M23319 Other meniscus derangements, anterior horn of medial meniscus, unspecified knee: Secondary | ICD-10-CM | POA: Diagnosis not present

## 2012-09-09 DIAGNOSIS — D492 Neoplasm of unspecified behavior of bone, soft tissue, and skin: Secondary | ICD-10-CM | POA: Diagnosis not present

## 2012-09-14 ENCOUNTER — Ambulatory Visit: Payer: Self-pay | Admitting: Orthopedic Surgery

## 2012-09-14 DIAGNOSIS — R229 Localized swelling, mass and lump, unspecified: Secondary | ICD-10-CM | POA: Diagnosis not present

## 2012-09-16 DIAGNOSIS — M25569 Pain in unspecified knee: Secondary | ICD-10-CM | POA: Diagnosis not present

## 2012-09-17 DIAGNOSIS — E291 Testicular hypofunction: Secondary | ICD-10-CM | POA: Diagnosis not present

## 2012-09-17 DIAGNOSIS — N329 Bladder disorder, unspecified: Secondary | ICD-10-CM | POA: Diagnosis not present

## 2012-09-17 DIAGNOSIS — Z125 Encounter for screening for malignant neoplasm of prostate: Secondary | ICD-10-CM | POA: Diagnosis not present

## 2012-09-17 DIAGNOSIS — N4889 Other specified disorders of penis: Secondary | ICD-10-CM | POA: Diagnosis not present

## 2012-10-05 DIAGNOSIS — E291 Testicular hypofunction: Secondary | ICD-10-CM | POA: Diagnosis not present

## 2012-10-12 DIAGNOSIS — Z940 Kidney transplant status: Secondary | ICD-10-CM | POA: Diagnosis not present

## 2012-10-12 DIAGNOSIS — Z79899 Other long term (current) drug therapy: Secondary | ICD-10-CM | POA: Diagnosis not present

## 2012-10-29 DIAGNOSIS — E291 Testicular hypofunction: Secondary | ICD-10-CM | POA: Diagnosis not present

## 2012-11-30 DIAGNOSIS — E291 Testicular hypofunction: Secondary | ICD-10-CM | POA: Diagnosis not present

## 2012-11-30 DIAGNOSIS — Z125 Encounter for screening for malignant neoplasm of prostate: Secondary | ICD-10-CM | POA: Diagnosis not present

## 2012-11-30 DIAGNOSIS — N329 Bladder disorder, unspecified: Secondary | ICD-10-CM | POA: Diagnosis not present

## 2012-11-30 DIAGNOSIS — N4889 Other specified disorders of penis: Secondary | ICD-10-CM | POA: Diagnosis not present

## 2012-12-18 DIAGNOSIS — E291 Testicular hypofunction: Secondary | ICD-10-CM | POA: Diagnosis not present

## 2012-12-18 DIAGNOSIS — N329 Bladder disorder, unspecified: Secondary | ICD-10-CM | POA: Diagnosis not present

## 2012-12-18 DIAGNOSIS — Z125 Encounter for screening for malignant neoplasm of prostate: Secondary | ICD-10-CM | POA: Diagnosis not present

## 2012-12-18 DIAGNOSIS — N4889 Other specified disorders of penis: Secondary | ICD-10-CM | POA: Diagnosis not present

## 2012-12-29 DIAGNOSIS — Z79899 Other long term (current) drug therapy: Secondary | ICD-10-CM | POA: Diagnosis not present

## 2012-12-29 DIAGNOSIS — Z940 Kidney transplant status: Secondary | ICD-10-CM | POA: Diagnosis not present

## 2013-01-21 DIAGNOSIS — Z940 Kidney transplant status: Secondary | ICD-10-CM | POA: Diagnosis not present

## 2013-01-21 DIAGNOSIS — Z79899 Other long term (current) drug therapy: Secondary | ICD-10-CM | POA: Diagnosis not present

## 2013-03-05 DIAGNOSIS — Z79899 Other long term (current) drug therapy: Secondary | ICD-10-CM | POA: Diagnosis not present

## 2013-03-05 DIAGNOSIS — Z940 Kidney transplant status: Secondary | ICD-10-CM | POA: Diagnosis not present

## 2013-04-19 DIAGNOSIS — Z79899 Other long term (current) drug therapy: Secondary | ICD-10-CM | POA: Diagnosis not present

## 2013-04-19 DIAGNOSIS — Z940 Kidney transplant status: Secondary | ICD-10-CM | POA: Diagnosis not present

## 2013-04-21 DIAGNOSIS — N051 Unspecified nephritic syndrome with focal and segmental glomerular lesions: Secondary | ICD-10-CM | POA: Insufficient documentation

## 2013-06-23 DIAGNOSIS — Z79899 Other long term (current) drug therapy: Secondary | ICD-10-CM | POA: Diagnosis not present

## 2013-06-23 DIAGNOSIS — Z940 Kidney transplant status: Secondary | ICD-10-CM | POA: Diagnosis not present

## 2013-06-28 DIAGNOSIS — D472 Monoclonal gammopathy: Secondary | ICD-10-CM | POA: Diagnosis not present

## 2013-06-28 DIAGNOSIS — Z940 Kidney transplant status: Secondary | ICD-10-CM | POA: Diagnosis not present

## 2013-07-13 DIAGNOSIS — Z79899 Other long term (current) drug therapy: Secondary | ICD-10-CM | POA: Diagnosis not present

## 2013-07-13 DIAGNOSIS — Z940 Kidney transplant status: Secondary | ICD-10-CM | POA: Diagnosis not present

## 2013-10-18 ENCOUNTER — Ambulatory Visit: Payer: Self-pay | Admitting: Unknown Physician Specialty

## 2013-10-18 DIAGNOSIS — Z800 Family history of malignant neoplasm of digestive organs: Secondary | ICD-10-CM | POA: Diagnosis not present

## 2013-10-18 DIAGNOSIS — Z86010 Personal history of colonic polyps: Secondary | ICD-10-CM | POA: Diagnosis not present

## 2013-10-20 LAB — PATHOLOGY REPORT

## 2013-10-28 DIAGNOSIS — Z79899 Other long term (current) drug therapy: Secondary | ICD-10-CM | POA: Diagnosis not present

## 2013-10-28 DIAGNOSIS — Z940 Kidney transplant status: Secondary | ICD-10-CM | POA: Diagnosis not present

## 2013-11-05 ENCOUNTER — Encounter: Payer: Self-pay | Admitting: *Deleted

## 2013-12-02 ENCOUNTER — Encounter: Payer: Self-pay | Admitting: General Surgery

## 2013-12-02 ENCOUNTER — Ambulatory Visit (INDEPENDENT_AMBULATORY_CARE_PROVIDER_SITE_OTHER): Payer: BC Managed Care – PPO | Admitting: General Surgery

## 2013-12-02 VITALS — BP 150/84 | HR 74 | Resp 14 | Ht 70.0 in | Wt 261.0 lb

## 2013-12-02 DIAGNOSIS — K439 Ventral hernia without obstruction or gangrene: Secondary | ICD-10-CM

## 2013-12-02 DIAGNOSIS — Z940 Kidney transplant status: Secondary | ICD-10-CM

## 2013-12-02 NOTE — Patient Instructions (Addendum)
Ventral Hernia A ventral hernia (also called an incisional hernia) is a hernia that occurs at the site of a previous surgical cut (incision) in the abdomen. The abdominal wall spans from your lower chest down to your pelvis. If the abdominal wall is weakened from a surgical incision, a hernia can occur. A hernia is a bulge of bowel or muscle tissue pushing out on the weakened part of the abdominal wall. Ventral hernias can get bigger from straining or lifting. Obese and older people are at higher risk for a ventral hernia. People who develop infections after surgery or require repeat incisions at the same site on the abdomen are also at increased risk. CAUSES  A ventral hernia occurs because of weakness in the abdominal wall at an incision site.  SYMPTOMS  Common symptoms include:  A visible bulge or lump on the abdominal wall.  Pain or tenderness around the lump.  Increased discomfort if you cough or make a sudden movement. If the hernia has blocked part of the intestine, a serious complication can occur (incarcerated or strangulated hernia). This can become a problem that requires emergency surgery because the blood flow to the blocked intestine may be cut off. Symptoms may include:  Feeling sick to your stomach (nauseous).  Throwing up (vomiting).  Stomach swelling (distention) or bloating.  Fever.  Rapid heartbeat. DIAGNOSIS  Your caregiver will take a medical history and perform a physical exam. Various tests may be ordered, such as:  Blood tests.  Urine tests.  Ultrasonography.  X-rays.  Computed tomography (CT). TREATMENT  Watchful waiting may be all that is needed for a smaller hernia that does not cause symptoms. Your caregiver may recommend the use of a supportive belt (truss) that helps to keep the abdominal wall intact. For larger hernias or those that cause pain, surgery to repair the hernia is usually recommended. If a hernia becomes strangulated, emergency surgery  needs to be done right away. HOME CARE INSTRUCTIONS  Avoid putting pressure or strain on the abdominal area.  Avoid heavy lifting.  Use good body positioning for physical tasks. Ask your caregiver about proper body positioning.  Use a supportive belt as directed by your caregiver.  Maintain a healthy weight.  Eat foods that are high in fiber, such as whole grains, fruits, and vegetables. Fiber helps prevent difficult bowel movements (constipation).  Drink enough fluids to keep your urine clear or pale yellow.  Follow up with your caregiver as directed. SEEK MEDICAL CARE IF:   Your hernia seems to be getting larger or more painful. SEEK IMMEDIATE MEDICAL CARE IF:   You have abdominal pain that is sudden and sharp.  Your pain becomes severe.  You have repeated vomiting.  You are sweating a lot.  You notice a rapid heartbeat.  You develop a fever. MAKE SURE YOU:   Understand these instructions.  Will watch your condition.  Will get help right away if you are not doing well or get worse. Document Released: 11/25/2012 Document Reviewed: 11/25/2012 [facility] Patient Information 2014 [place], [place].    The patient has been scheduled for ventral hernia repair at [facility] on 12/29/2013. He is aware of date and instructions. He has been scheduled for Pre Admit testing on 12/21/13 at 8:15 am at [facility].

## 2013-12-02 NOTE — Progress Notes (Signed)
Patient ID: Chad Mccann, male   DOB: [birth year], 47 y.o.   MRN: [id number]  Chief Complaint  Patient presents with  . Other    ventral hernia    HPI Chad Mccann is a 47 y.o. male here today for a evaluation of ventral hernia. Patient states he developed this when his dialysis port was removed after his kidney transplant surgery about a year ago. He states that it has since been enlarging and is uncomfortable to sleep on. HPI  Past Medical History  Diagnosis Date  . Other specified congenital anomaly of kidney   . Other testicular hypofunction   . Hypertension   . Sleep apnea   . Other testicular hypofunction   . Premature ejaculation   . Monoclonal paraproteinemia   . Obesity, unspecified   . Other specified disorders of bladder   . Benign prostatic hypertrophy     Past Surgical History  Procedure Laterality Date  . Colonoscopy    . Kidney transplant  01/2012  . Spine surgery      cervical fusion  . Dialysis port placement      Family History  Problem Relation Age of Onset  . Colon cancer Father 12    Social History History  Substance Use Topics  . Smoking status: Never Smoker   . Smokeless tobacco: Never Used  . Alcohol Use: No    Allergies  Allergen Reactions  . Prednisone     hallucinations  . Shellfish Allergy Nausea And Vomiting    Current Outpatient Prescriptions  Medication Sig Dispense Refill  . amLODipine-benazepril (LOTREL) 5-10 MG per capsule Take 1 capsule by mouth daily.      Marland Kitchen aspirin 81 MG tablet Take 81 mg by mouth daily.      Marland Kitchen atorvastatin (LIPITOR) 10 MG tablet Take 10 mg by mouth daily.      . clonazePAM (KLONOPIN) 0.5 MG tablet Take 0.5 mg by mouth 2 (two) times daily as needed for anxiety.      . metoprolol succinate (TOPROL-XL) 50 MG 24 hr tablet Take 50 mg by mouth 2 (two) times daily. Take with or immediately following a meal.      . mycophenolate (CELLCEPT) 500 MG tablet Take 750 mg by mouth 2 (two) times daily.      Marland Kitchen  omega-3 acid ethyl esters (LOVAZA) 1 G capsule Take 1 g by mouth 2 (two) times daily.      Marland Kitchen omeprazole (PRILOSEC) 20 MG capsule Take 20 mg by mouth daily.      . sildenafil (VIAGRA) 100 MG tablet Take 100 mg by mouth daily as needed for erectile dysfunction.      . tacrolimus (PROGRAF) 1 MG capsule Take 4 mg by mouth 2 (two) times daily.      . tadalafil (CIALIS) 20 MG tablet Take 20 mg by mouth daily as needed for erectile dysfunction.      Marland Kitchen testosterone cypionate (DEPOTESTOTERONE CYPIONATE) 200 MG/ML injection Inject 250 mg into the muscle every 28 (twenty-eight) days.      . traMADol (ULTRAM) 50 MG tablet Take 50 mg by mouth every 6 (six) hours as needed.       No current facility-administered medications for this visit.    Review of Systems Review of Systems  Constitutional: Negative.   Respiratory: Negative.   Cardiovascular: Negative.     Blood pressure 150/84, pulse 74, resp. rate 14, height 5\' 10"  (1.778 m), weight 261 lb (118.389 kg).  Physical Exam Physical Exam  Constitutional: He is oriented to person, place, and time. He appears well-developed and well-nourished.  Eyes: No scleral icterus.  Cardiovascular: Normal rate, regular rhythm and normal heart sounds.   Pulmonary/Chest: Breath sounds normal.  Abdominal: Soft. Normal appearance and bowel sounds are normal. There is no hepatomegaly. There is no tenderness. A hernia is present. Hernia confirmed positive in the ventral area.  Lymphadenopathy:    He has no cervical adenopathy.  Neurological: He is alert and oriented to person, place, and time.  Skin: Skin is warm and dry.    Data Reviewed 12/29/2010 CT of the abdomen and pelvis.  Assessment    Ventral hernia at site of previous peritoneal dialysis catheter.    Plan    It's difficult to ascertain the fascial defect size as the protruding omentum/preperitoneal fat is nonreducible. This is likely a small defect, and will likely be managed without the use of  prosthetic mesh. The possibility of mesh placement was reviewed in the event that the defect is larger than clinically suggested. The risks associated with mesh placement in the face of immunosuppression were reviewed.  A copy of the note will be forwarded to his nephrologist at [facility], Valentina Gu, MD who by patient report was aware he was undergoing evaluation for repair of his hernia.      The patient has been scheduled for ventral hernia repair at [facility] on 12/29/2013. He is aware of date and instructions. He has been scheduled for Pre Admit testing on 12/21/13 at 8:15 am at [facility].  Earline Mayotte 12/05/2013, 9:15 AM

## 2013-12-05 ENCOUNTER — Other Ambulatory Visit: Payer: Self-pay | Admitting: General Surgery

## 2013-12-05 DIAGNOSIS — K439 Ventral hernia without obstruction or gangrene: Secondary | ICD-10-CM

## 2013-12-05 DIAGNOSIS — Z940 Kidney transplant status: Secondary | ICD-10-CM

## 2013-12-09 ENCOUNTER — Telehealth: Payer: Self-pay | Admitting: *Deleted

## 2013-12-09 NOTE — Telephone Encounter (Signed)
Message copied by Levada Schilling on Thu Dec 09, 2013  1:33 PM ------      Message from: [place], [place] W      Created: Thu Dec 09, 2013  7:40 AM       Contact the patient and find out where his peritoneal catheter was placed and removed. Not done w/ Dr. Gilda Crease locally. Thanks.       ----- Message -----         From: Sinda Du, LPN         Sent: 12/08/2013  10:26 AM           To: Earline Mayotte, MD            Dr. Lynford Citizen did not put in his peritoneal catheter. He was last seen in his office in May 2012 for his radial fistula for dialysis, he was scheduled for surgery for revision of this but it was cancelled and he has not been seen since. I placed his last records on your desk.      ----- Message -----         From: Earline Mayotte, MD         Sent: 12/02/2013   9:01 PM           To: Sinda Du, LPN            The patient reported that Dr. Gilda Crease removed his peritoneal dialysis catheter. I cannot find any record this [facility]. Please see that's what the procedure was completed. Obtain records if possible.             ------

## 2013-12-22 ENCOUNTER — Telehealth: Payer: Self-pay | Admitting: *Deleted

## 2013-12-22 NOTE — Telephone Encounter (Signed)
Per Yetta Flock in pre-admit called and said patient missed his pre-admission appointment yesterday, 12-21-13 at 8:15 am.   Message left for patient on cell and work numbers to call the office.  He is currently scheduled for surgery on 12-29-13. We need to make sure he is still planning on having this done. Also, patient will need to call [phone] to get appointment re-scheduled.

## 2013-12-22 NOTE — Telephone Encounter (Signed)
Patient's wife called back to report that husband was unaware of the appointment. [place] have just [place]. Patient's wife was given the number to call and get pre-admission appointment rescheduled. She verbalizes understanding.

## 2013-12-27 ENCOUNTER — Ambulatory Visit: Payer: Self-pay | Admitting: General Surgery

## 2013-12-27 ENCOUNTER — Encounter: Payer: Self-pay | Admitting: General Surgery

## 2013-12-27 DIAGNOSIS — I10 Essential (primary) hypertension: Secondary | ICD-10-CM | POA: Diagnosis not present

## 2013-12-27 DIAGNOSIS — Z91013 Allergy to seafood: Secondary | ICD-10-CM | POA: Diagnosis not present

## 2013-12-27 DIAGNOSIS — Z79899 Other long term (current) drug therapy: Secondary | ICD-10-CM | POA: Diagnosis not present

## 2013-12-27 DIAGNOSIS — D472 Monoclonal gammopathy: Secondary | ICD-10-CM | POA: Diagnosis not present

## 2013-12-27 DIAGNOSIS — Z940 Kidney transplant status: Secondary | ICD-10-CM | POA: Diagnosis not present

## 2013-12-27 DIAGNOSIS — Z9889 Other specified postprocedural states: Secondary | ICD-10-CM | POA: Diagnosis not present

## 2013-12-27 DIAGNOSIS — Z800 Family history of malignant neoplasm of digestive organs: Secondary | ICD-10-CM | POA: Diagnosis not present

## 2013-12-27 DIAGNOSIS — Z01812 Encounter for preprocedural laboratory examination: Secondary | ICD-10-CM | POA: Diagnosis not present

## 2013-12-27 DIAGNOSIS — N329 Bladder disorder, unspecified: Secondary | ICD-10-CM | POA: Diagnosis not present

## 2013-12-27 DIAGNOSIS — G4730 Sleep apnea, unspecified: Secondary | ICD-10-CM | POA: Diagnosis not present

## 2013-12-27 DIAGNOSIS — F524 Premature ejaculation: Secondary | ICD-10-CM | POA: Diagnosis not present

## 2013-12-27 DIAGNOSIS — N400 Enlarged prostate without lower urinary tract symptoms: Secondary | ICD-10-CM | POA: Diagnosis not present

## 2013-12-27 DIAGNOSIS — E291 Testicular hypofunction: Secondary | ICD-10-CM | POA: Diagnosis not present

## 2013-12-27 DIAGNOSIS — E669 Obesity, unspecified: Secondary | ICD-10-CM | POA: Diagnosis not present

## 2013-12-27 DIAGNOSIS — K439 Ventral hernia without obstruction or gangrene: Secondary | ICD-10-CM | POA: Diagnosis not present

## 2013-12-27 DIAGNOSIS — Z888 Allergy status to other drugs, medicaments and biological substances status: Secondary | ICD-10-CM | POA: Diagnosis not present

## 2013-12-27 LAB — BASIC METABOLIC PANEL
Anion Gap: 4 — ABNORMAL LOW (ref 7–16)
BUN: 13 mg/dL (ref 7–18)
CHLORIDE: 103 mmol/L (ref 98–107)
Calcium, Total: 9.4 mg/dL (ref 8.5–10.1)
Co2: 28 mmol/L (ref 21–32)
Creatinine: 1.41 mg/dL — ABNORMAL HIGH (ref 0.60–1.30)
EGFR (Non-African Amer.): 58 — ABNORMAL LOW
GLUCOSE: 93 mg/dL (ref 65–99)
Osmolality: 270 (ref 275–301)
POTASSIUM: 4.5 mmol/L (ref 3.5–5.1)
Sodium: 135 mmol/L — ABNORMAL LOW (ref 136–145)

## 2013-12-27 LAB — CBC WITH DIFFERENTIAL/PLATELET
BASOS ABS: 0 10*3/uL (ref 0.0–0.1)
BASOS PCT: 0.5 %
Eosinophil #: 0.1 10*3/uL (ref 0.0–0.7)
Eosinophil %: 2.2 %
HCT: 50 % (ref 40.0–52.0)
HGB: 16.9 g/dL (ref 13.0–18.0)
LYMPHS ABS: 0.5 10*3/uL — AB (ref 1.0–3.6)
LYMPHS PCT: 10.3 %
MCH: 30 pg (ref 26.0–34.0)
MCHC: 33.9 g/dL (ref 32.0–36.0)
MCV: 89 fL (ref 80–100)
MONO ABS: 0.5 x10 3/mm (ref 0.2–1.0)
Monocyte %: 10.3 %
Neutrophil #: 4 10*3/uL (ref 1.4–6.5)
Neutrophil %: 76.7 %
PLATELETS: 208 10*3/uL (ref 150–440)
RBC: 5.64 10*6/uL (ref 4.40–5.90)
RDW: 14.2 % (ref 11.5–14.5)
WBC: 5.2 10*3/uL (ref 3.8–10.6)

## 2013-12-27 LAB — PROTIME-INR
INR: 1
PROTHROMBIN TIME: 12.7 s (ref 11.5–14.7)

## 2013-12-27 LAB — APTT: ACTIVATED PTT: 24 s (ref 23.6–35.9)

## 2013-12-29 ENCOUNTER — Ambulatory Visit: Payer: Self-pay | Admitting: General Surgery

## 2013-12-29 DIAGNOSIS — K439 Ventral hernia without obstruction or gangrene: Secondary | ICD-10-CM

## 2013-12-29 HISTORY — PX: HERNIA REPAIR: SHX51

## 2013-12-30 ENCOUNTER — Encounter: Payer: Self-pay | Admitting: General Surgery

## 2014-01-06 ENCOUNTER — Ambulatory Visit (INDEPENDENT_AMBULATORY_CARE_PROVIDER_SITE_OTHER): Payer: BC Managed Care – PPO | Admitting: General Surgery

## 2014-01-06 ENCOUNTER — Encounter: Payer: Self-pay | Admitting: General Surgery

## 2014-01-06 VITALS — BP 146/78 | HR 76 | Resp 14 | Ht 70.0 in | Wt 259.0 lb

## 2014-01-06 DIAGNOSIS — E291 Testicular hypofunction: Secondary | ICD-10-CM | POA: Diagnosis not present

## 2014-01-06 DIAGNOSIS — K439 Ventral hernia without obstruction or gangrene: Secondary | ICD-10-CM

## 2014-01-06 NOTE — Patient Instructions (Addendum)
Proper lifting techniques reviewed. Patient to return as needed.  

## 2014-01-06 NOTE — Progress Notes (Signed)
Patient ID: Chad Mccann, male   DOB: 04-12-1966, 48 y.o.   MRN: [id number]  Chief Complaint  Patient presents with  . Routine Post Op    ventral hernia    HPI Chad Mccann is a 48 y.o. male here today for his post op ventral hernia repair done on 12/29/13. Patient states he is doing well. A primary repair of a less than 1 cm hernia at the site of his previous peritoneal dialysis catheter was completed. The patient reports no difficulty with bowel or bladder function. He has been careful with lifting. He has been able to return to work earlier this week. HPI  Past Medical History  Diagnosis Date  . Other specified congenital anomaly of kidney   . Other testicular hypofunction   . Hypertension   . Sleep apnea   . Other testicular hypofunction   . Premature ejaculation   . Monoclonal paraproteinemia   . Obesity, unspecified   . Other specified disorders of bladder   . Benign prostatic hypertrophy     Past Surgical History  Procedure Laterality Date  . Colonoscopy    . Kidney transplant  01/2012  . Spine surgery      cervical fusion  . Dialysis port placement    . Hernia repair  12/29/13    ventral    Family History  Problem Relation Age of Onset  . Colon cancer Father 48    Social History History  Substance Use Topics  . Smoking status: Never Smoker   . Smokeless tobacco: Never Used  . Alcohol Use: No    Allergies  Allergen Reactions  . Prednisone     hallucinations  . Shellfish Allergy Nausea And Vomiting    Current Outpatient Prescriptions  Medication Sig Dispense Refill  . amLODipine-benazepril (LOTREL) 5-10 MG per capsule Take 1 capsule by mouth daily.      Marland Kitchen aspirin 81 MG tablet Take 81 mg by mouth daily.      Marland Kitchen atorvastatin (LIPITOR) 10 MG tablet Take 10 mg by mouth daily.      . clonazePAM (KLONOPIN) 0.5 MG tablet Take 0.5 mg by mouth 2 (two) times daily as needed for anxiety.      . metoprolol succinate (TOPROL-XL) 50 MG 24 hr tablet Take 50 mg  by mouth 2 (two) times daily. Take with or immediately following a meal.      . mycophenolate (CELLCEPT) 500 MG tablet Take 750 mg by mouth 2 (two) times daily.      Marland Kitchen omega-3 acid ethyl esters (LOVAZA) 1 G capsule Take 1 g by mouth 2 (two) times daily.      Marland Kitchen omeprazole (PRILOSEC) 20 MG capsule Take 20 mg by mouth daily.      . sildenafil (VIAGRA) 100 MG tablet Take 100 mg by mouth daily as needed for erectile dysfunction.      . tacrolimus (PROGRAF) 1 MG capsule Take 4 mg by mouth 2 (two) times daily.      . tadalafil (CIALIS) 20 MG tablet Take 20 mg by mouth daily as needed for erectile dysfunction.      Marland Kitchen testosterone cypionate (DEPOTESTOTERONE CYPIONATE) 200 MG/ML injection Inject 250 mg into the muscle every 28 (twenty-eight) days.      . traMADol (ULTRAM) 50 MG tablet Take 50 mg by mouth every 6 (six) hours as needed.       No current facility-administered medications for this visit.    Review of Systems Review of Systems  Constitutional:  Negative.   Respiratory: Negative.   Cardiovascular: Negative.     Blood pressure 146/78, pulse 76, resp. rate 14, height 5\' 10"  (1.778 m), weight 259 lb (117.482 kg).  Physical Exam Physical Exam  Constitutional: He is oriented to person, place, and time. He appears well-nourished.  Abdominal:  Ventral hernia site is healing well .  Neurological: He is alert and oriented to person, place, and time.  Skin: Skin is warm and dry.       Assessment    Doing well status post ventral hernia repair.     Plan    Proper lifting technique was demonstrated. Care was strenuous activity was discussed. Should he be interested in returning to the gym, he has been asked to limit weightlifting to one extremity at the time until the first of the month.  He's been encouraged to call if he has any concerns, follow up otherwise will be on an as-needed basis.        Robert Bellow 01/06/2014, 9:55 PM

## 2014-01-07 NOTE — Telephone Encounter (Signed)
Patient had PD catheter removed by the transplant team at [facility]. Records have been requested.

## 2014-01-11 DIAGNOSIS — Z940 Kidney transplant status: Secondary | ICD-10-CM | POA: Diagnosis not present

## 2014-01-11 DIAGNOSIS — Z79899 Other long term (current) drug therapy: Secondary | ICD-10-CM | POA: Diagnosis not present

## 2014-01-20 DIAGNOSIS — E291 Testicular hypofunction: Secondary | ICD-10-CM | POA: Diagnosis not present

## 2014-01-25 DIAGNOSIS — Z79899 Other long term (current) drug therapy: Secondary | ICD-10-CM | POA: Diagnosis not present

## 2014-01-25 DIAGNOSIS — G4700 Insomnia, unspecified: Secondary | ICD-10-CM | POA: Diagnosis not present

## 2014-01-25 DIAGNOSIS — N281 Cyst of kidney, acquired: Secondary | ICD-10-CM | POA: Diagnosis not present

## 2014-01-25 DIAGNOSIS — IMO0002 Reserved for concepts with insufficient information to code with codable children: Secondary | ICD-10-CM | POA: Diagnosis not present

## 2014-01-25 DIAGNOSIS — Z885 Allergy status to narcotic agent status: Secondary | ICD-10-CM | POA: Diagnosis not present

## 2014-01-25 DIAGNOSIS — Z13820 Encounter for screening for osteoporosis: Secondary | ICD-10-CM | POA: Diagnosis not present

## 2014-01-25 DIAGNOSIS — E785 Hyperlipidemia, unspecified: Secondary | ICD-10-CM | POA: Diagnosis not present

## 2014-01-25 DIAGNOSIS — N270 Small kidney, unilateral: Secondary | ICD-10-CM | POA: Diagnosis not present

## 2014-01-25 DIAGNOSIS — D899 Disorder involving the immune mechanism, unspecified: Secondary | ICD-10-CM | POA: Diagnosis not present

## 2014-01-25 DIAGNOSIS — K219 Gastro-esophageal reflux disease without esophagitis: Secondary | ICD-10-CM | POA: Diagnosis not present

## 2014-01-25 DIAGNOSIS — Z1159 Encounter for screening for other viral diseases: Secondary | ICD-10-CM | POA: Diagnosis not present

## 2014-01-25 DIAGNOSIS — Z48298 Encounter for aftercare following other organ transplant: Secondary | ICD-10-CM | POA: Diagnosis not present

## 2014-01-25 DIAGNOSIS — N032 Chronic nephritic syndrome with diffuse membranous glomerulonephritis: Secondary | ICD-10-CM | POA: Diagnosis not present

## 2014-01-25 DIAGNOSIS — Z940 Kidney transplant status: Secondary | ICD-10-CM | POA: Diagnosis not present

## 2014-01-25 DIAGNOSIS — I10 Essential (primary) hypertension: Secondary | ICD-10-CM | POA: Diagnosis not present

## 2014-02-02 DIAGNOSIS — E291 Testicular hypofunction: Secondary | ICD-10-CM | POA: Diagnosis not present

## 2014-02-21 DIAGNOSIS — E291 Testicular hypofunction: Secondary | ICD-10-CM | POA: Diagnosis not present

## 2014-03-08 DIAGNOSIS — E291 Testicular hypofunction: Secondary | ICD-10-CM | POA: Diagnosis not present

## 2014-03-21 DIAGNOSIS — E291 Testicular hypofunction: Secondary | ICD-10-CM | POA: Diagnosis not present

## 2014-04-04 DIAGNOSIS — E291 Testicular hypofunction: Secondary | ICD-10-CM | POA: Diagnosis not present

## 2014-04-12 DIAGNOSIS — Z940 Kidney transplant status: Secondary | ICD-10-CM | POA: Diagnosis not present

## 2014-04-12 DIAGNOSIS — Z79899 Other long term (current) drug therapy: Secondary | ICD-10-CM | POA: Diagnosis not present

## 2014-04-19 DIAGNOSIS — E291 Testicular hypofunction: Secondary | ICD-10-CM | POA: Diagnosis not present

## 2014-05-03 DIAGNOSIS — N400 Enlarged prostate without lower urinary tract symptoms: Secondary | ICD-10-CM | POA: Diagnosis not present

## 2014-05-03 DIAGNOSIS — E291 Testicular hypofunction: Secondary | ICD-10-CM | POA: Diagnosis not present

## 2014-05-17 DIAGNOSIS — E291 Testicular hypofunction: Secondary | ICD-10-CM | POA: Diagnosis not present

## 2014-05-31 DIAGNOSIS — E291 Testicular hypofunction: Secondary | ICD-10-CM | POA: Diagnosis not present

## 2014-05-31 DIAGNOSIS — N400 Enlarged prostate without lower urinary tract symptoms: Secondary | ICD-10-CM | POA: Diagnosis not present

## 2014-06-16 DIAGNOSIS — E291 Testicular hypofunction: Secondary | ICD-10-CM | POA: Diagnosis not present

## 2014-06-17 DIAGNOSIS — Z940 Kidney transplant status: Secondary | ICD-10-CM | POA: Diagnosis not present

## 2014-06-17 DIAGNOSIS — Z79899 Other long term (current) drug therapy: Secondary | ICD-10-CM | POA: Diagnosis not present

## 2014-06-27 DIAGNOSIS — B079 Viral wart, unspecified: Secondary | ICD-10-CM | POA: Diagnosis not present

## 2014-06-27 DIAGNOSIS — L851 Acquired keratosis [keratoderma] palmaris et plantaris: Secondary | ICD-10-CM | POA: Diagnosis not present

## 2014-06-27 DIAGNOSIS — L708 Other acne: Secondary | ICD-10-CM | POA: Diagnosis not present

## 2014-06-30 DIAGNOSIS — E291 Testicular hypofunction: Secondary | ICD-10-CM | POA: Diagnosis not present

## 2014-07-12 DIAGNOSIS — E291 Testicular hypofunction: Secondary | ICD-10-CM | POA: Diagnosis not present

## 2014-07-14 DIAGNOSIS — Z940 Kidney transplant status: Secondary | ICD-10-CM | POA: Diagnosis not present

## 2014-07-14 DIAGNOSIS — Z79899 Other long term (current) drug therapy: Secondary | ICD-10-CM | POA: Diagnosis not present

## 2014-07-18 DIAGNOSIS — I10 Essential (primary) hypertension: Secondary | ICD-10-CM | POA: Diagnosis not present

## 2014-07-18 DIAGNOSIS — E669 Obesity, unspecified: Secondary | ICD-10-CM | POA: Diagnosis not present

## 2014-07-18 DIAGNOSIS — Z940 Kidney transplant status: Secondary | ICD-10-CM | POA: Diagnosis not present

## 2014-07-18 DIAGNOSIS — N032 Chronic nephritic syndrome with diffuse membranous glomerulonephritis: Secondary | ICD-10-CM | POA: Diagnosis not present

## 2014-07-18 DIAGNOSIS — G4733 Obstructive sleep apnea (adult) (pediatric): Secondary | ICD-10-CM | POA: Diagnosis not present

## 2014-07-18 DIAGNOSIS — L708 Other acne: Secondary | ICD-10-CM | POA: Diagnosis not present

## 2014-07-26 DIAGNOSIS — E291 Testicular hypofunction: Secondary | ICD-10-CM | POA: Diagnosis not present

## 2014-08-09 DIAGNOSIS — E291 Testicular hypofunction: Secondary | ICD-10-CM | POA: Diagnosis not present

## 2014-08-23 DIAGNOSIS — E291 Testicular hypofunction: Secondary | ICD-10-CM | POA: Diagnosis not present

## 2014-09-06 DIAGNOSIS — E291 Testicular hypofunction: Secondary | ICD-10-CM | POA: Diagnosis not present

## 2014-09-20 DIAGNOSIS — E291 Testicular hypofunction: Secondary | ICD-10-CM | POA: Diagnosis not present

## 2014-09-27 DIAGNOSIS — Z940 Kidney transplant status: Secondary | ICD-10-CM | POA: Diagnosis not present

## 2014-09-27 DIAGNOSIS — Z79899 Other long term (current) drug therapy: Secondary | ICD-10-CM | POA: Diagnosis not present

## 2014-10-04 DIAGNOSIS — E291 Testicular hypofunction: Secondary | ICD-10-CM | POA: Diagnosis not present

## 2014-11-01 DIAGNOSIS — E291 Testicular hypofunction: Secondary | ICD-10-CM | POA: Diagnosis not present

## 2014-11-10 DIAGNOSIS — Z79899 Other long term (current) drug therapy: Secondary | ICD-10-CM | POA: Diagnosis not present

## 2014-11-10 DIAGNOSIS — Z940 Kidney transplant status: Secondary | ICD-10-CM | POA: Diagnosis not present

## 2014-11-29 DIAGNOSIS — Z940 Kidney transplant status: Secondary | ICD-10-CM | POA: Diagnosis not present

## 2014-11-29 DIAGNOSIS — Z79899 Other long term (current) drug therapy: Secondary | ICD-10-CM | POA: Diagnosis not present

## 2014-11-30 DIAGNOSIS — E291 Testicular hypofunction: Secondary | ICD-10-CM | POA: Diagnosis not present

## 2015-01-05 DIAGNOSIS — I10 Essential (primary) hypertension: Secondary | ICD-10-CM | POA: Diagnosis not present

## 2015-01-05 DIAGNOSIS — E663 Overweight: Secondary | ICD-10-CM | POA: Diagnosis not present

## 2015-01-05 DIAGNOSIS — E291 Testicular hypofunction: Secondary | ICD-10-CM | POA: Diagnosis not present

## 2015-01-05 DIAGNOSIS — F5221 Male erectile disorder: Secondary | ICD-10-CM | POA: Diagnosis not present

## 2015-01-05 DIAGNOSIS — N400 Enlarged prostate without lower urinary tract symptoms: Secondary | ICD-10-CM | POA: Diagnosis not present

## 2015-01-17 DIAGNOSIS — Z940 Kidney transplant status: Secondary | ICD-10-CM | POA: Diagnosis not present

## 2015-01-17 DIAGNOSIS — Z79899 Other long term (current) drug therapy: Secondary | ICD-10-CM | POA: Diagnosis not present

## 2015-02-02 DIAGNOSIS — E663 Overweight: Secondary | ICD-10-CM | POA: Diagnosis not present

## 2015-02-02 DIAGNOSIS — N400 Enlarged prostate without lower urinary tract symptoms: Secondary | ICD-10-CM | POA: Diagnosis not present

## 2015-02-02 DIAGNOSIS — I10 Essential (primary) hypertension: Secondary | ICD-10-CM | POA: Diagnosis not present

## 2015-02-02 DIAGNOSIS — E291 Testicular hypofunction: Secondary | ICD-10-CM | POA: Diagnosis not present

## 2015-02-02 DIAGNOSIS — F5221 Male erectile disorder: Secondary | ICD-10-CM | POA: Diagnosis not present

## 2015-04-06 DIAGNOSIS — E291 Testicular hypofunction: Secondary | ICD-10-CM | POA: Diagnosis not present

## 2015-04-06 DIAGNOSIS — R972 Elevated prostate specific antigen [PSA]: Secondary | ICD-10-CM | POA: Diagnosis not present

## 2015-04-10 DIAGNOSIS — E291 Testicular hypofunction: Secondary | ICD-10-CM | POA: Diagnosis not present

## 2015-04-15 NOTE — Op Note (Signed)
PATIENT NAME:  Chad Mccann, Chad Mccann MR#:  [id number] DATE OF BIRTH:  1966-07-11  DATE OF PROCEDURE:  12/29/2013  PREOPERATIVE DIAGNOSIS: Ventral hernia with incarcerated omentum.   POSTOPERATIVE DIAGNOSIS: Ventral hernia with incarcerated omentum.   OPERATIVE PROCEDURE: Repair of ventral hernia.   SURGEON: Hervey Ard, MD.   ANESTHESIA: General endotracheal under Dr. Benjamine Mola, Marcaine 0.5% plain, 30 mL local infiltration, Toradol 30 mg local infiltration.   ESTIMATED BLOOD LOSS: Minimal.   CLINICAL NOTE: This 49 year old male has developed a ventral hernia at the site of a previously placed peritoneal dialysis catheter. He has noted discomfort from incarcerated omentum and was admitted for elective repair. He received Kefzol intravenously prior to the procedure.   OPERATIVE NOTE: With the patient under adequate general endotracheal anesthesia and hair previously removed with clippers, the abdomen was prepped with ChloraPrep and draped. An infraumbilical incision was made and carried down through the skin and subcutaneous tissue with hemostasis achieved by electrocautery. The umbilical skin was elevated off the underlying hernia sac. The hernia sac was freed circumferentially from the adipose tissue and then from the fascia. The peritoneum was thickened consistent with his previous peritoneal dialysis. The sac was intact. This was returned to the preperitoneal space. The fascial defect was less than 2 cm in diameter and a primary repair with interrupted 0 Surgilon sutures was completed. The umbilical skin was tacked to the fascia with 2-0 Vicryl figure-of-eight suture. The adipose layer was closed with a running 2-0 Vicryl. Marcaine was infiltrated in this area for postoperative analgesia. Toradol was instilled as well. The skin was closed with a running 4-0 Vicryl subcuticular suture. Benzoin, Steri-Strips, Telfa, and Tegaderm dressing was then applied. The patient tolerated the procedure well and was  taken to the recovery room in stable condition.   ____________________________ Robert Bellow, MD jwb:aw D: 12/29/2013 13:09:37 ET T: 12/29/2013 13:37:11 ET JOB#: [id number]  cc: Robert Bellow, MD, <Dictator> Nelda Severe. Burt Ek, MD Viraat Vanpatten Amedeo Kinsman MD ELECTRONICALLY SIGNED 12/29/2013 21:08

## 2015-04-19 DIAGNOSIS — I10 Essential (primary) hypertension: Secondary | ICD-10-CM | POA: Diagnosis not present

## 2015-04-19 DIAGNOSIS — B070 Plantar wart: Secondary | ICD-10-CM | POA: Diagnosis not present

## 2015-04-19 DIAGNOSIS — G4733 Obstructive sleep apnea (adult) (pediatric): Secondary | ICD-10-CM | POA: Diagnosis not present

## 2015-04-19 DIAGNOSIS — Z940 Kidney transplant status: Secondary | ICD-10-CM | POA: Diagnosis not present

## 2015-04-26 DIAGNOSIS — E291 Testicular hypofunction: Secondary | ICD-10-CM | POA: Diagnosis not present

## 2015-05-01 DIAGNOSIS — B070 Plantar wart: Secondary | ICD-10-CM | POA: Diagnosis not present

## 2015-05-09 DIAGNOSIS — E291 Testicular hypofunction: Secondary | ICD-10-CM | POA: Diagnosis not present

## 2015-05-18 DIAGNOSIS — Z940 Kidney transplant status: Secondary | ICD-10-CM | POA: Diagnosis not present

## 2015-05-30 ENCOUNTER — Ambulatory Visit (INDEPENDENT_AMBULATORY_CARE_PROVIDER_SITE_OTHER): Payer: BLUE CROSS/BLUE SHIELD

## 2015-05-30 DIAGNOSIS — E291 Testicular hypofunction: Secondary | ICD-10-CM

## 2015-05-30 MED ORDER — TESTOSTERONE CYPIONATE 200 MG/ML IM SOLN
200.0000 mg | Freq: Once | INTRAMUSCULAR | Status: AC
  Administered 2015-05-30: 200 mg via INTRAMUSCULAR

## 2015-06-13 ENCOUNTER — Ambulatory Visit (INDEPENDENT_AMBULATORY_CARE_PROVIDER_SITE_OTHER): Payer: BLUE CROSS/BLUE SHIELD | Admitting: Urology

## 2015-06-13 ENCOUNTER — Other Ambulatory Visit: Payer: Self-pay | Admitting: Urology

## 2015-06-13 DIAGNOSIS — E291 Testicular hypofunction: Secondary | ICD-10-CM

## 2015-06-13 MED ORDER — TESTOSTERONE CYPIONATE 200 MG/ML IM SOLN
200.0000 mg | Freq: Once | INTRAMUSCULAR | Status: AC
  Administered 2015-06-13: 200 mg via INTRAMUSCULAR

## 2015-06-13 NOTE — Progress Notes (Signed)
Testosterone IM Injection  Due to Hypogonadism patient is present today for a Testosterone Injection.  Medication: Testosterone Cypionate Dose: 500mg  Location: right upper outer buttocks Lot: [id number] Exp:03/2016  Patient tolerated well, no complications were noted  Preformed by: Toniann Fail, LPN

## 2015-06-13 NOTE — Telephone Encounter (Signed)
Can pt have refills?  

## 2015-06-27 ENCOUNTER — Ambulatory Visit: Payer: BLUE CROSS/BLUE SHIELD

## 2015-06-29 ENCOUNTER — Ambulatory Visit (INDEPENDENT_AMBULATORY_CARE_PROVIDER_SITE_OTHER): Payer: BLUE CROSS/BLUE SHIELD

## 2015-06-29 DIAGNOSIS — E291 Testicular hypofunction: Secondary | ICD-10-CM | POA: Diagnosis not present

## 2015-06-29 MED ORDER — TESTOSTERONE CYPIONATE 200 MG/ML IM SOLN
200.0000 mg | Freq: Once | INTRAMUSCULAR | Status: AC
  Administered 2015-06-29: 200 mg via INTRAMUSCULAR

## 2015-06-29 NOTE — Progress Notes (Signed)
Testosterone IM Injection  Due to Hypogonadism patient is present today for a Testosterone Injection.  Medication: Testosterone Cypionate Dose: 500mg  Location: left upper outer buttocks Lot: [id number] Exp:03/2016  Patient tolerated well, no complications were noted  Preformed by: Toniann Fail, LPN

## 2015-07-06 ENCOUNTER — Encounter: Payer: Self-pay | Admitting: *Deleted

## 2015-07-06 ENCOUNTER — Ambulatory Visit: Payer: Self-pay | Admitting: Urology

## 2015-07-06 ENCOUNTER — Other Ambulatory Visit: Payer: Self-pay | Admitting: *Deleted

## 2015-07-10 ENCOUNTER — Encounter: Payer: Self-pay | Admitting: Urology

## 2015-07-10 ENCOUNTER — Telehealth: Payer: Self-pay | Admitting: Urology

## 2015-07-10 ENCOUNTER — Ambulatory Visit (INDEPENDENT_AMBULATORY_CARE_PROVIDER_SITE_OTHER): Payer: BLUE CROSS/BLUE SHIELD | Admitting: Urology

## 2015-07-10 VITALS — BP 127/83 | HR 66 | Resp 18 | Ht 70.0 in | Wt 266.9 lb

## 2015-07-10 DIAGNOSIS — E291 Testicular hypofunction: Secondary | ICD-10-CM

## 2015-07-10 DIAGNOSIS — N138 Other obstructive and reflux uropathy: Secondary | ICD-10-CM | POA: Insufficient documentation

## 2015-07-10 DIAGNOSIS — N528 Other male erectile dysfunction: Secondary | ICD-10-CM | POA: Diagnosis not present

## 2015-07-10 DIAGNOSIS — N401 Enlarged prostate with lower urinary tract symptoms: Secondary | ICD-10-CM

## 2015-07-10 DIAGNOSIS — N529 Male erectile dysfunction, unspecified: Secondary | ICD-10-CM | POA: Insufficient documentation

## 2015-07-10 DIAGNOSIS — Q792 Exomphalos: Secondary | ICD-10-CM | POA: Insufficient documentation

## 2015-07-10 DIAGNOSIS — K219 Gastro-esophageal reflux disease without esophagitis: Secondary | ICD-10-CM | POA: Insufficient documentation

## 2015-07-10 LAB — BLADDER SCAN AMB NON-IMAGING

## 2015-07-10 MED ORDER — TESTOSTERONE CYPIONATE 200 MG/ML IM SOLN
200.0000 mg | Freq: Once | INTRAMUSCULAR | Status: AC
  Administered 2015-07-10: 200 mg via INTRAMUSCULAR

## 2015-07-10 NOTE — Progress Notes (Addendum)
07/10/2015 12:31 PM   Chad Mccann April 11, 1966 [id number]  Referring provider: Shepard General, MD [facility] [address] [place], [place] [address]  Chief Complaint  Patient presents with  . Hypogonadism    6 month recheck  . Benign Prostatic Hypertrophy    HPI: [name] is a 49 year old white male with hypogonadism, erectile dysfunction and BPH with LUTS who presents today for his 6 month visit.  His IPSS score today is 3, which is mild symptomatology. He is delighted with his quality of life due to his urinary symptoms. His PVR is 70 milliliters.  He denies any recent gross hematuria, dysuria or suprapubic pain. Also denies any fevers chills nausea or vomiting.      IPSS      07/10/15 1100       International Prostate Symptom Score   How often have you had the sensation of not emptying your bladder? Not at All     How often have you had to urinate less than every two hours? Less than 1 in 5 times     How often have you found you stopped and started again several times when you urinated? Not at All     How often have you found it difficult to postpone urination? Less than 1 in 5 times     How often have you had a weak urinary stream? Not at All     How often have you had to strain to start urination? Not at All     How many times did you typically get up at night to urinate? 1 Time     Total IPSS Score 3     Quality of Life due to urinary symptoms   If you were to spend the rest of your life with your urinary condition just the way it is now how would you feel about that? Delighted        Score:  1-7 Mild 8-19 Moderate 20-35 Severe  His SHIM is 20, which is a mild erectile dysfunction. He is managing it with Cialis 20 mg. He does prefer the Viagra, but his insurance would not cover that medication. He denies any painful erections or penile curvature.      SHIM      07/10/15 1139       SHIM: Over the last 6 months:   How do  you rate your confidence that you could get and keep an erection? Moderate     When you had erections with sexual stimulation, how often were your erections hard enough for penetration (entering your partner)? Most Times (much more than half the time)     During sexual intercourse, how often were you able to maintain your erection after you had penetrated (entered) your partner? Slightly Difficult     During sexual intercourse, how difficult was it to maintain your erection to completion of intercourse? Slightly Difficult     When you attempted sexual intercourse, how often was it satisfactory for you? Not Difficult     SHIM Total Score   SHIM 20        Score: 1-7 Severe ED 8-11 Moderate ED 12-16 Mild-Moderate ED 17-21 Mild ED 22-25 No ED   His is receiving depot testosterone 500 mg IM every month.  He is having good results.     Androgen Deficiency in the Aging Male      07/10/15 1100       Androgen Deficiency in the Aging  Male   Do you have a decrease in libido (sex drive) No     Do you have lack of energy No     Do you have a decrease in strength and/or endurance Yes     Have you lost height No     Have you noticed a decreased "enjoyment of life" No     Are you sad and/or grumpy No     Are your erections less strong Yes     Have you noticed a recent deterioration in your ability to play sports No     Are you falling asleep after dinner No     Has there been a recent deterioration in your work performance No           PMH: Past Medical History  Diagnosis Date  . Other specified congenital anomaly of kidney   . Other testicular hypofunction   . Hypertension   . Sleep apnea   . Hypogonadism in male   . Premature ejaculation   . Monoclonal paraproteinemia   . Obesity, unspecified   . Other specified disorders of bladder   . Benign prostatic hypertrophy   . Bladder spasm   . Kidney 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   . MGUS (monoclonal gammopathy of unknown significance)     . Kidney, malrotation   . Erectile dysfunction   . Ventral hernia   . Frequency     Surgical History: Past Surgical History  Procedure Laterality Date  . Colonoscopy      polyp removal  . Kidney transplant  01/2012  . Spine surgery      cervical fusion  . Dialysis port placement    . Hernia repair  12/29/13    ventral    Home Medications:    Medication List       This list is accurate as of: 07/10/15 12:31 PM.  Always use your most recent med list.               amLODipine 5 MG tablet  Commonly known as:  NORVASC  Take 10 mg by mouth.     amLODipine-benazepril 5-10 MG per capsule  Commonly known as:  LOTREL  Take 1 capsule by mouth daily.     aspirin 81 MG tablet  Take 81 mg by mouth daily.     atorvastatin 10 MG tablet  Commonly known as:  LIPITOR  Take 10 mg by mouth daily.     CIALIS 20 MG tablet  Generic drug:  tadalafil  TAKE ONE TABLET ONE HOUR PRIOR TO SEXUALACTIVITY     clonazePAM 0.5 MG tablet  Commonly known as:  KLONOPIN  Take 0.5 mg by mouth 2 (two) times daily as needed for anxiety.     coal tar-salicylic acid 2 % shampoo     fluorouracil 5 % cream  Commonly known as:  EFUDEX  Apply to warts in the morning.     metoprolol 50 MG tablet  Commonly known as:  LOPRESSOR  Take 50 mg by mouth.     mycophenolate 500 MG tablet  Commonly known as:  CELLCEPT  Take 750 mg by mouth 2 (two) times daily.     omega-3 acid ethyl esters 1 G capsule  Commonly known as:  LOVAZA  Take 1 g by mouth 2 (two) times daily.     omeprazole 20 MG capsule  Commonly known as:  PRILOSEC  Take 20 mg by mouth daily.     tacrolimus 1 MG capsule  Commonly known as:  PROGRAF  Take 4 mg by mouth 2 (two) times daily.     testosterone cypionate 200 MG/ML injection  Commonly known as:  DEPOTESTOSTERONE CYPIONATE  Inject 250 mg into the muscle every 28 (twenty-eight) days.     zinc sulfate 220 MG capsule  Take 220 mg by mouth.        Allergies:  Allergies   Allergen Reactions  . Hydralazine   . Morphine And Related   . Prednisone     hallucinations  . Shellfish Allergy Nausea And Vomiting    Family History: Family History  Problem Relation Age of Onset  . Colon cancer Father 71  . Heart attack Father   . Stroke Brother     Social History:  reports that he has never smoked. He has never used smokeless tobacco. He reports that he does not drink alcohol or use illicit drugs.  ROS: UROLOGY Frequent Urination?: No Hard to postpone urination?: No Burning/pain with urination?: No Get up at night to urinate?: No Leakage of urine?: No Urine stream starts and stops?: No Trouble starting stream?: No Do you have to strain to urinate?: No Blood in urine?: No Urinary tract infection?: No Sexually transmitted disease?: No Injury to kidneys or bladder?: No Painful intercourse?: No Weak stream?: No Erection problems?: No Penile pain?: No  Gastrointestinal Nausea?: No Vomiting?: No Indigestion/heartburn?: No Diarrhea?: No Constipation?: No  Constitutional Fever: No Night sweats?: No Weight loss?: No Fatigue?: No  Skin Skin rash/lesions?: No Itching?: No  Eyes Blurred vision?: No Double vision?: No  Ears/Nose/Throat Sore throat?: No Sinus problems?: No  Hematologic/Lymphatic Swollen glands?: No Easy bruising?: No  Cardiovascular Leg swelling?: No Chest pain?: No  Respiratory Cough?: No Shortness of breath?: No  Endocrine Excessive thirst?: No  Musculoskeletal Back pain?: Yes Joint pain?: No  Neurological Headaches?: No Dizziness?: No  Psychologic Depression?: No Anxiety?: No  Physical Exam: BP 127/83 mmHg  Pulse 66  Resp 18  Ht 5\' 10"  (1.778 m)  Wt 266 lb 14.4 oz (121.065 kg)  BMI 38.30 kg/m2  GU: Patient with circumcised phallus.  Urethral meatus is patent.  No penile discharge. No penile lesions or rashes. Scrotum without lesions, cysts, rashes and/or edema.  Testicles are located  scrotally bilaterally. No masses are appreciated in the testicles. Left and right epididymis are normal. Rectal: Patient with  normal sphincter tone. Perineum without scarring or rashes. No rectal masses are appreciated. Prostate is approximately 45 grams, no nodules are appreciated. Seminal vesicles are normal.   Laboratory Data: Results for orders placed or performed in visit on 07/10/15  BLADDER SCAN AMB NON-IMAGING  Result Value Ref Range   Scan Result 57ml    Lab Results  Component Value Date   WBC 5.2 12/27/2013   HGB 16.9 12/27/2013   HCT 50.0 12/27/2013   MCV 89 12/27/2013   PLT 208 12/27/2013    Lab Results  Component Value Date   CREATININE 1.41* 12/27/2013    No results found for: PSA  No results found for: TESTOSTERONE  No results found for: HGBA1C  Urinalysis No results found for: COLORURINE, APPEARANCEUR, LABSPEC, PHURINE, GLUCOSEU, HGBUR, BILIRUBINUR, KETONESUR, PROTEINUR, UROBILINOGEN, NITRITE, LEUKOCYTESUR  Pertinent Imaging:  Assessment & Plan:   1. BPH (benign prostatic hyperplasia) with LUTS:   His IPSS score today is 3, which is mild symptomatology. He is delighted with his quality of life due to his urinary symptoms. His PVR is 70 milliliters. We will continue to monitor with  IPSS score, DRE, PVR and PSA every 6 months.  - PSA - BLADDER SCAN AMB NON-IMAGING  PSA history:     0.6 ng/mL on 11/01/2013     1.0 ng/mL on 05/03/2014     0.6 ng/mL on 04/06/2015  2. Erectile dysfunction:   His SHIM score is 20 today which is marking a mild erectile dysfunction. He is responding well to Cialis 20 mg on-demand dosing. He does prefer Viagra, but it is cost prohibitive. We will recheck his SHIM score in 6 months.  3. Hypogonadism:   Patient is receiving 500 mg IM of Depot testosterone on a monthly basis. He has received his injection today.    - Hematocrit  No Follow-up on file.  Zara Council, [facility] [address] [place], [place] [address] [phone]

## 2015-07-10 NOTE — Telephone Encounter (Signed)
Is patient returning at another time for a morning testosterone?

## 2015-07-10 NOTE — Progress Notes (Signed)
Testosterone IM Injection  Due to Hypogonadism patient is present today for a Testosterone Injection.  Medication: Testosterone Cypionate Dose: 2.23ml Location: right upper outer buttocks Lot: [id number]  Exp:)03/2016  Patient tolerated well, no complications were noted  Preformed by: KR

## 2015-07-11 ENCOUNTER — Ambulatory Visit: Payer: Self-pay

## 2015-07-11 LAB — HEMATOCRIT: Hematocrit: 48.1 % (ref 37.5–51.0)

## 2015-07-11 LAB — PSA: PROSTATE SPECIFIC AG, SERUM: 0.9 ng/mL (ref 0.0–4.0)

## 2015-07-11 NOTE — Telephone Encounter (Signed)
Spoke with pt in reference to testosterone. Pt will return 07/14/15 for labs. Orders have been placed.

## 2015-07-14 ENCOUNTER — Other Ambulatory Visit: Payer: Self-pay

## 2015-07-25 ENCOUNTER — Ambulatory Visit: Payer: BLUE CROSS/BLUE SHIELD

## 2015-08-14 ENCOUNTER — Ambulatory Visit: Payer: BLUE CROSS/BLUE SHIELD

## 2015-08-15 ENCOUNTER — Ambulatory Visit: Payer: BLUE CROSS/BLUE SHIELD

## 2015-08-15 ENCOUNTER — Ambulatory Visit (INDEPENDENT_AMBULATORY_CARE_PROVIDER_SITE_OTHER): Payer: BLUE CROSS/BLUE SHIELD

## 2015-08-15 DIAGNOSIS — E291 Testicular hypofunction: Secondary | ICD-10-CM | POA: Diagnosis not present

## 2015-08-15 MED ORDER — TESTOSTERONE CYPIONATE 200 MG/ML IM SOLN
200.0000 mg | Freq: Once | INTRAMUSCULAR | Status: AC
  Administered 2015-08-15: 200 mg via INTRAMUSCULAR

## 2015-08-15 NOTE — Progress Notes (Signed)
Testosterone IM Injection  Due to Hypogonadism patient is present today for a Testosterone Injection.  Medication: Testosterone Cypionate Dose: 500mg  Location: left upper outer buttocks Lot: [id number] Exp:03/2016  Patient tolerated well, no complications were noted  Preformed by: Toniann Fail, LPN

## 2015-08-16 LAB — TESTOSTERONE: Testosterone: 79 ng/dL — ABNORMAL LOW (ref 348–1197)

## 2015-08-16 LAB — HEMATOCRIT: HEMATOCRIT: 47.9 % (ref 37.5–51.0)

## 2015-08-17 ENCOUNTER — Telehealth: Payer: Self-pay

## 2015-08-17 NOTE — Telephone Encounter (Signed)
Spoke with pt in reference to lab results. Pt stated he missed his injection 2 weeks ago. Therefore he feels his results are lower than normal. Pt asked if he needs to come next week for a lab draw. Please advise.

## 2015-08-17 NOTE — Telephone Encounter (Signed)
That will be fine. 

## 2015-08-17 NOTE — Telephone Encounter (Signed)
LMOM for pt to call back and make lab appt for next week

## 2015-08-17 NOTE — Telephone Encounter (Signed)
-----   Message from Nori Riis, PA-C sent at 08/17/2015  8:11 AM EDT ----- Patient's serum testosterone is low, but we caught him during the trough. If he feels well clinically with can continue current regimen of 500 mg IM monthly.

## 2015-08-29 ENCOUNTER — Ambulatory Visit (INDEPENDENT_AMBULATORY_CARE_PROVIDER_SITE_OTHER): Payer: BLUE CROSS/BLUE SHIELD | Admitting: *Deleted

## 2015-08-29 DIAGNOSIS — E291 Testicular hypofunction: Secondary | ICD-10-CM

## 2015-08-29 MED ORDER — TESTOSTERONE CYPIONATE 200 MG/ML IM SOLN
200.0000 mg | Freq: Once | INTRAMUSCULAR | Status: AC
  Administered 2015-08-29: 200 mg via INTRAMUSCULAR

## 2015-08-29 NOTE — Progress Notes (Signed)
Testosterone IM Injection  Due to Hypogonadism patient is present today for a Testosterone Injection.  Medication: Testosterone Cypionate Dose: 500 mg (2.5ML) Location: right upper outer buttocks Lot: [id number]    Exp:03/2016  Patient tolerated well, no complications were noted  Preformed by: Lyndee Hensen CMA Follow up: 28 days

## 2015-09-12 ENCOUNTER — Ambulatory Visit (INDEPENDENT_AMBULATORY_CARE_PROVIDER_SITE_OTHER): Payer: BLUE CROSS/BLUE SHIELD

## 2015-09-12 DIAGNOSIS — E291 Testicular hypofunction: Secondary | ICD-10-CM

## 2015-09-12 MED ORDER — TESTOSTERONE CYPIONATE 200 MG/ML IM SOLN
200.0000 mg | Freq: Once | INTRAMUSCULAR | Status: AC
  Administered 2015-09-12: 200 mg via INTRAMUSCULAR

## 2015-09-12 NOTE — Progress Notes (Signed)
Testosterone IM Injection  Due to Hypogonadism patient is present today for a Testosterone Injection.  Medication: Testosterone Cypionate Dose: 2.52mL Location: left upper outer buttocks Lot: [id number] Exp:03/2016  Patient tolerated well, no complications were noted  Preformed by: Toniann Fail, LPN

## 2015-09-25 ENCOUNTER — Ambulatory Visit: Payer: BLUE CROSS/BLUE SHIELD

## 2015-09-26 ENCOUNTER — Ambulatory Visit (INDEPENDENT_AMBULATORY_CARE_PROVIDER_SITE_OTHER): Payer: BLUE CROSS/BLUE SHIELD

## 2015-09-26 DIAGNOSIS — E291 Testicular hypofunction: Secondary | ICD-10-CM

## 2015-09-26 MED ORDER — TESTOSTERONE CYPIONATE 200 MG/ML IM SOLN
500.0000 mg | Freq: Once | INTRAMUSCULAR | Status: AC
  Administered 2015-09-26: 500 mg via INTRAMUSCULAR

## 2015-09-26 NOTE — Progress Notes (Signed)
Testosterone IM Injection  Due to Hypogonadism patient is present today for a Testosterone Injection.  Medication: Testosterone Cypionate Dose: 500mg  Location: right upper outer buttocks Lot: [id number] Exp:03/2016  Patient tolerated well, no complications were noted  Preformed by: Fonnie Jarvis , CMA  Follow up: 2wk for injection, Patient was told that he would need a refill on medication for his next apt, the last of his medication was used at this appointment  ** There was some confusion as to the dosing and time of patient's injection therapy, noted in the chart previously his dose is 500mg  IM monthly. Per the patient and was given verbal ok by Zara Council, PAC the dose is to be 500mg  IM every 2weeks this will be changed in the chart for notation and for next injection appointment

## 2015-10-10 ENCOUNTER — Ambulatory Visit (INDEPENDENT_AMBULATORY_CARE_PROVIDER_SITE_OTHER): Payer: BLUE CROSS/BLUE SHIELD

## 2015-10-10 ENCOUNTER — Other Ambulatory Visit: Payer: Self-pay

## 2015-10-10 DIAGNOSIS — E291 Testicular hypofunction: Secondary | ICD-10-CM

## 2015-10-10 MED ORDER — TESTOSTERONE CYPIONATE 200 MG/ML IM SOLN: 500.0000 mg | INTRAMUSCULAR | Status: DC

## 2015-10-10 MED ORDER — TESTOSTERONE CYPIONATE 200 MG/ML IM SOLN
200.0000 mg | Freq: Once | INTRAMUSCULAR | Status: AC
  Administered 2015-10-10: 200 mg via INTRAMUSCULAR

## 2015-10-10 NOTE — Progress Notes (Signed)
Testosterone IM Injection  Due to Hypogonadism patient is present today for a Testosterone Injection.  Medication: Testosterone Cypionate Dose: 531mL Location: left upper outer buttocks Lot: [id number] Exp:06/2017  Patient tolerated well, no complications were noted  Preformed by: Toniann Fail, LPN   Follow up: 2 weeks

## 2015-10-24 ENCOUNTER — Ambulatory Visit (INDEPENDENT_AMBULATORY_CARE_PROVIDER_SITE_OTHER): Payer: BLUE CROSS/BLUE SHIELD

## 2015-10-24 DIAGNOSIS — E291 Testicular hypofunction: Secondary | ICD-10-CM

## 2015-10-24 MED ORDER — TESTOSTERONE CYPIONATE 200 MG/ML IM SOLN
200.0000 mg | Freq: Once | INTRAMUSCULAR | Status: AC
  Administered 2015-10-24: 200 mg via INTRAMUSCULAR

## 2015-10-24 NOTE — Progress Notes (Signed)
Testosterone IM Injection  Due to Hypogonadism patient is present today for a Testosterone Injection.  Medication: Testosterone Cypionate Dose: 2.52mL Location: left upper outer buttocks Lot: [id number] Exp:06/2017  Patient tolerated well, no complications were noted  Preformed by: Toniann Fail, LPN

## 2015-11-03 ENCOUNTER — Other Ambulatory Visit: Payer: Self-pay

## 2015-11-07 ENCOUNTER — Ambulatory Visit (INDEPENDENT_AMBULATORY_CARE_PROVIDER_SITE_OTHER): Payer: BLUE CROSS/BLUE SHIELD

## 2015-11-07 DIAGNOSIS — E291 Testicular hypofunction: Secondary | ICD-10-CM

## 2015-11-07 MED ORDER — TESTOSTERONE CYPIONATE 200 MG/ML IM SOLN
500.0000 mg | Freq: Once | INTRAMUSCULAR | Status: AC
  Administered 2015-11-07: 500 mg via INTRAMUSCULAR

## 2015-11-07 NOTE — Progress Notes (Signed)
Testosterone IM Injection  Due to Hypogonadism patient is present today for a Testosterone Injection.  Medication: Testosterone Cypionate Dose: 500mg  Location: right upper outer buttocks Lot [id number]  Exp.06/2017  Patient tolerated well, no complications were noted  Preformed by: Vickki Hearing  Follow up: 2 weeks. Pt instructed that he needs to bring in more Testosterone for his next visit and that he will get his testosterone levels checked as well.

## 2015-11-21 ENCOUNTER — Ambulatory Visit (INDEPENDENT_AMBULATORY_CARE_PROVIDER_SITE_OTHER): Payer: BLUE CROSS/BLUE SHIELD

## 2015-11-21 DIAGNOSIS — E291 Testicular hypofunction: Secondary | ICD-10-CM | POA: Diagnosis not present

## 2015-11-21 MED ORDER — TESTOSTERONE CYPIONATE 200 MG/ML IM SOLN
200.0000 mg | Freq: Once | INTRAMUSCULAR | Status: AC
  Administered 2015-11-21: 200 mg via INTRAMUSCULAR

## 2015-11-21 NOTE — Progress Notes (Signed)
Testosterone IM Injection  Due to Hypogonadism patient is present today for a Testosterone Injection.  Medication: Testosterone Cypionate Dose: 500cc Location: left upper outer buttocks Lot: [id number] Exp:06/2017  Patient tolerated well, no complications were noted  Preformed by: Toniann Fail, LPN   Follow up: pt was given last bit of medication. Pt will have labs drawn prior to next injection so he can get a refill on medication.

## 2015-11-28 ENCOUNTER — Other Ambulatory Visit: Payer: Self-pay | Admitting: Urology

## 2015-11-28 ENCOUNTER — Other Ambulatory Visit: Payer: BLUE CROSS/BLUE SHIELD

## 2015-11-28 ENCOUNTER — Telehealth: Payer: Self-pay | Admitting: Urology

## 2015-11-28 DIAGNOSIS — IMO0002 Reserved for concepts with insufficient information to code with codable children: Secondary | ICD-10-CM

## 2015-11-28 DIAGNOSIS — Q600 Renal agenesis, unilateral: Secondary | ICD-10-CM

## 2015-11-28 DIAGNOSIS — R109 Unspecified abdominal pain: Secondary | ICD-10-CM

## 2015-11-28 DIAGNOSIS — N400 Enlarged prostate without lower urinary tract symptoms: Secondary | ICD-10-CM

## 2015-11-28 DIAGNOSIS — E291 Testicular hypofunction: Secondary | ICD-10-CM

## 2015-11-28 NOTE — Addendum Note (Signed)
Addended by: Orlene Erm on: 11/28/2015 01:49 PM   Modules accepted: Orders

## 2015-11-28 NOTE — Telephone Encounter (Signed)
Harrie Jeans wanted to know if you would give him a call when you had a chance. He is  Having some pain in his Kidney and is concerned since he only has the one.  [phone]  Thanks,  Sharyn Lull

## 2015-11-28 NOTE — Telephone Encounter (Signed)
Please schedule a RUS on this patient.

## 2015-11-29 ENCOUNTER — Ambulatory Visit: Payer: Self-pay

## 2015-11-29 ENCOUNTER — Telehealth: Payer: Self-pay

## 2015-11-29 LAB — TESTOSTERONE: Testosterone: >1500 ng/dL — ABNORMAL HIGH (ref 348–1197)

## 2015-11-29 LAB — CREATININE, SERUM
Creatinine, Ser: 1.29 mg/dL — ABNORMAL HIGH (ref 0.76–1.27)
GFR calc Af Amer: 74 mL/min/{1.73_m2} (ref >59)
GFR, EST NON AFRICAN AMERICAN: 64 mL/min/{1.73_m2} (ref >59)

## 2015-11-29 LAB — HEPATIC FUNCTION PANEL
ALBUMIN: 4.6 g/dL (ref 3.5–5.5)
ALK PHOS: 72 IU/L (ref 39–117)
ALT: 22 IU/L (ref 0–44)
AST: 11 IU/L (ref 0–40)
Bilirubin Total: 0.6 mg/dL (ref 0.0–1.2)
Bilirubin, Direct: 0.15 mg/dL (ref 0.00–0.40)
TOTAL PROTEIN: 6.9 g/dL (ref 6.0–8.5)

## 2015-11-29 LAB — LIPID PANEL
CHOL/HDL RATIO: 5.2 ratio — AB (ref 0.0–5.0)
CHOLESTEROL TOTAL: 130 mg/dL (ref 100–199)
HDL: 25 mg/dL — AB (ref >39)
LDL Calculated: 74 mg/dL (ref 0–99)
TRIGLYCERIDES: 157 mg/dL — AB (ref 0–149)
VLDL CHOLESTEROL CAL: 31 mg/dL (ref 5–40)

## 2015-11-29 LAB — PSA: Prostate Specific Ag, Serum: 0.9 ng/mL (ref 0.0–4.0)

## 2015-11-29 LAB — TSH: TSH: 2.29 u[IU]/mL (ref 0.450–4.500)

## 2015-11-29 NOTE — Telephone Encounter (Signed)
-----   Message from Nori Riis, PA-C sent at 11/29/2015  9:42 AM EST ----- Patient's serum testosterone level is elevated.  He needs to discontinue the injections at this time.  We will discuss this further when he has his appointment on the 13th.

## 2015-11-29 NOTE — Telephone Encounter (Signed)
Spoke with pt in reference to testosterone levels. Pt voiced understanding.

## 2015-12-04 ENCOUNTER — Ambulatory Visit: Payer: BLUE CROSS/BLUE SHIELD

## 2015-12-05 ENCOUNTER — Ambulatory Visit (INDEPENDENT_AMBULATORY_CARE_PROVIDER_SITE_OTHER): Payer: BLUE CROSS/BLUE SHIELD | Admitting: Urology

## 2015-12-05 ENCOUNTER — Ambulatory Visit
Admission: RE | Admit: 2015-12-05 | Discharge: 2015-12-05 | Disposition: A | Discharge status: Home / Self Care | Payer: BLUE CROSS/BLUE SHIELD | Source: Ambulatory Visit | Attending: Urology | Admitting: Urology

## 2015-12-05 ENCOUNTER — Encounter: Payer: Self-pay | Admitting: Urology

## 2015-12-05 VITALS — BP 159/96 | HR 91 | Ht 70.0 in | Wt 264.0 lb

## 2015-12-05 DIAGNOSIS — M549 Dorsalgia, unspecified: Secondary | ICD-10-CM

## 2015-12-05 DIAGNOSIS — Z940 Kidney transplant status: Secondary | ICD-10-CM | POA: Diagnosis not present

## 2015-12-05 DIAGNOSIS — E291 Testicular hypofunction: Secondary | ICD-10-CM

## 2015-12-05 DIAGNOSIS — R109 Unspecified abdominal pain: Secondary | ICD-10-CM | POA: Insufficient documentation

## 2015-12-05 DIAGNOSIS — Z79899 Other long term (current) drug therapy: Secondary | ICD-10-CM | POA: Diagnosis not present

## 2015-12-05 NOTE — Progress Notes (Signed)
12/05/2015 11:40 PM   Chad Mccann 1966-02-16 [id number]  Referring provider: Shepard General, MD [facility] [address] [place], [place] [address]  Chief Complaint  Patient presents with  . Results    HPI: Patient is a 49 year old Caucasian male who presents today for his RUS report.   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  He was concerned that he was experiencing another kidney stone and was worried it may be effecting his transplanted kidney.  He was having gross hematuria, suprapubic pain or urinary symptoms.  He was not having associated fevers, chills, nausea or vomiting.  His recent serum creatinine is 1.29.  Renal ultrasound performed on 12/05/2015 noted native kidneys atrophic with cortical thinning an increased echotexture.  No hydronephrosis.  No hydronephrosis within the transplanted kidney.  I reviewed the films with the patient.  Today, he is still experiencing the back pain.  He is not having any dysuria, gross hematuria or suprapubic pain.  He has not had any fevers, chills, nausea or vomiting.   PMH: Past Medical History  Diagnosis Date  . Other specified congenital anomaly of kidney   . Other testicular hypofunction   . Hypertension   . Sleep apnea   . Hypogonadism in male   . Premature ejaculation   . Monoclonal paraproteinemia   . Obesity, unspecified   . Other specified disorders of bladder   . Benign prostatic hypertrophy   . Bladder spasm   . Kidney 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   . MGUS (monoclonal gammopathy of unknown significance)   . Kidney, malrotation   . Erectile dysfunction   . Ventral hernia   . Frequency     Surgical History: Past Surgical History  Procedure Laterality Date  . Colonoscopy      polyp removal  . Kidney transplant  01/2012  . Spine surgery      cervical  fusion  . Dialysis port placement    . Hernia repair  12/29/13    ventral    Home Medications:    Medication List       This list is accurate as of: 12/05/15 11:59 PM.  Always use your most recent med list.               amLODipine 5 MG tablet  Commonly known as:  NORVASC  Take 10 mg by mouth.     amLODipine-benazepril 5-10 MG capsule  Commonly known as:  LOTREL  Take 1 capsule by mouth daily.     aspirin 81 MG tablet  Take 81 mg by mouth daily.     atorvastatin 10 MG tablet  Commonly known as:  LIPITOR  Take 10 mg by mouth daily.     CIALIS 20 MG tablet  Generic drug:  tadalafil  TAKE ONE TABLET ONE HOUR PRIOR TO SEXUALACTIVITY     clonazePAM 0.5 MG tablet  Commonly known as:  KLONOPIN  Take 0.5 mg by mouth 2 (two) times daily as needed for anxiety.     coal tar-salicylic acid 2 % shampoo     fluorouracil 5 % cream  Commonly known as:  EFUDEX  Apply to warts in the morning.     metoprolol 50 MG tablet  Commonly known as:  LOPRESSOR  Take 50 mg by mouth.     mycophenolate 500 MG tablet  Commonly known as:  CELLCEPT  Take 750 mg by mouth 2 (two) times daily.     omega-3 acid ethyl esters 1 G capsule  Commonly known as:  LOVAZA  Take 1 g by mouth 2 (two) times daily.     omeprazole 20 MG capsule  Commonly known as:  PRILOSEC  Take 20 mg by mouth daily.     tacrolimus 1 MG capsule  Commonly known as:  PROGRAF  Take 4 mg by mouth 2 (two) times daily.     testosterone cypionate 200 MG/ML injection  Commonly known as:  DEPOTESTOSTERONE CYPIONATE  Inject 2.5 mLs (500 mg total) into the muscle every 14 (fourteen) days.     zinc sulfate 220 MG capsule  Take 220 mg by mouth.        Allergies:  Allergies  Allergen Reactions  . Hydralazine   . Morphine And Related   . Prednisone     hallucinations  . Shellfish Allergy Nausea And Vomiting    Family History: Family History  Problem Relation Age of Onset  . Colon cancer Father 22  . Heart  attack Father   . Stroke Brother     Social History:  reports that he has never smoked. He has never used smokeless tobacco. He reports that he does not drink alcohol or use illicit drugs.  ROS: UROLOGY Frequent Urination?: No Hard to postpone urination?: No Burning/pain with urination?: No Get up at night to urinate?: No Leakage of urine?: No Urine stream starts and stops?: No Trouble starting stream?: No Do you have to strain to urinate?: No Blood in urine?: No Urinary tract infection?: No Sexually transmitted disease?: No Injury to kidneys or bladder?: No Painful intercourse?: No Weak stream?: No Erection problems?: No Penile pain?: No  Gastrointestinal Nausea?: No Vomiting?: No Indigestion/heartburn?: No Diarrhea?: No Constipation?: No  Constitutional Fever: No Night sweats?: No Weight loss?: No Fatigue?: No  Skin Skin rash/lesions?: No Itching?: No  Eyes Blurred vision?: No Double vision?: No  Ears/Nose/Throat Sore throat?: No Sinus problems?: No  Hematologic/Lymphatic Swollen glands?: No Easy bruising?: No  Cardiovascular Leg swelling?: No Chest pain?: No  Respiratory Cough?: No Shortness of breath?: No  Endocrine Excessive thirst?: No  Musculoskeletal Back pain?: Yes Joint pain?: No  Neurological Headaches?: No Dizziness?: No  Psychologic Depression?: No Anxiety?: No  Physical Exam: BP 159/96 mmHg  Pulse 91  Ht 5\' 10"  (1.778 m)  Wt 264 lb (119.75 kg)  BMI 37.88 kg/m2  Constitutional: Well nourished. Alert and oriented, No acute distress. HEENT: [place] AT, moist mucus membranes. Trachea midline, no masses. Cardiovascular: No clubbing, cyanosis, or edema. Respiratory: Normal respiratory effort, no increased work of breathing. GI: Abdomen is soft, non tender, non distended, no abdominal masses. Liver and spleen not palpable.  No hernias appreciated.  Stool sample for occult testing is not indicated.   GU: No CVA tenderness.  No  bladder fullness or masses.   Skin: No rashes, bruises or suspicious lesions. Lymph: No cervical or inguinal adenopathy. Neurologic: Grossly intact, no focal deficits, moving all 4 extremities. Psychiatric: Normal mood and affect.  Laboratory Data:  Lab Results  Component Value Date   CREATININE 1.29* 11/28/2015    Lab Results  Component Value Date   PSA 0.9 11/28/2015   PSA 0.9 07/10/2015    Lab Results  Component Value Date   TESTOSTERONE 442 12/05/2015    Pertinent Imaging: CLINICAL DATA: Flank pain  EXAM: RENAL / URINARY TRACT ULTRASOUND COMPLETE  COMPARISON: CT 12/29/2010  FINDINGS:  Right Kidney:  Length: Open 8.5 cm. Diffusely increased echotexture with cortical thinning. No hydronephrosis.  Left Kidney:  Length: 7.9 cm. Diffusely increased echotexture with cortical thinning. No hydronephrosis.  Left lower quadrant transplant kidney:  12.6 cm. Normal echotexture. No mass or hydronephrosis.  Bladder:  Appears normal for degree of bladder distention.  IMPRESSION: Native kidneys are atrophic with cortical thinning and increased echotexture. No hydronephrosis.  No hydronephrosis within the left lower quadrant transplant kidney.   Electronically Signed  By: Rolm Baptise M.D.  On: 12/05/2015 11:46  Assessment & Plan:    1. Back pain:   Patient is encouraged to seek further evaluation with his PCP or nephrologist for his back pain.  2. History of a transplanted kidney:   Creatinine is stable and RUS did not demonstrate hydronephrosis in the transplanted kidney.  3. Hypogonadism:   Patient's testosterone was found to be >1500.  He is forgo his injection at this time.  We will repeat the serum testosterone next week.    Return for pending labs.  Zara Council, [facility] [address], [place], [place] [address] [phone]

## 2015-12-06 LAB — TESTOSTERONE: TESTOSTERONE: 442 ng/dL (ref 348–1197)

## 2015-12-06 LAB — HEMATOCRIT: Hematocrit: 48.7 % (ref 37.5–51.0)

## 2015-12-08 ENCOUNTER — Telehealth: Payer: Self-pay

## 2015-12-08 DIAGNOSIS — E291 Testicular hypofunction: Secondary | ICD-10-CM

## 2015-12-08 MED ORDER — TESTOSTERONE CYPIONATE 200 MG/ML IM SOLN: 500.0000 mg | INTRAMUSCULAR | Status: DC

## 2015-12-08 NOTE — Telephone Encounter (Signed)
Spoke with pt in reference to medication. Pt voiced understanding. Pt will RTC 12/19/15 for an injection.

## 2015-12-08 NOTE — Telephone Encounter (Signed)
-----   Message from Nori Riis, PA-C sent at 12/06/2015  8:53 AM EST ----- Patient's testosterone has returned to normal.  We discussed increasing the time between injections.  I suggest restarting his injections the week after Christmas.  His dose will be 500 mg IM every three weeks.

## 2015-12-10 DIAGNOSIS — M549 Dorsalgia, unspecified: Secondary | ICD-10-CM | POA: Insufficient documentation

## 2015-12-10 DIAGNOSIS — Z940 Kidney transplant status: Secondary | ICD-10-CM | POA: Insufficient documentation

## 2015-12-11 ENCOUNTER — Other Ambulatory Visit: Payer: BLUE CROSS/BLUE SHIELD

## 2015-12-19 ENCOUNTER — Ambulatory Visit (INDEPENDENT_AMBULATORY_CARE_PROVIDER_SITE_OTHER): Payer: BLUE CROSS/BLUE SHIELD

## 2015-12-19 DIAGNOSIS — E291 Testicular hypofunction: Secondary | ICD-10-CM

## 2015-12-19 MED ORDER — TESTOSTERONE CYPIONATE 200 MG/ML IM SOLN
200.0000 mg | Freq: Once | INTRAMUSCULAR | Status: AC
  Administered 2015-12-19: 200 mg via INTRAMUSCULAR

## 2015-12-19 NOTE — Progress Notes (Deleted)
Firmagon Sub Q Injection  Due to Prostate Cancer patient is present today for a Firmagon Injection.   Medication: Mills Koller (Degarelix)  Dose: 500mg mg Location: left upper abdomen Lot: [id number] Exp: 06/2017  Patient tolerated well, no complications were noted  Performed by: Toniann Fail, LPN   Follow up: 3 weeks

## 2016-01-04 ENCOUNTER — Ambulatory Visit: Payer: BLUE CROSS/BLUE SHIELD | Admitting: Nurse Practitioner

## 2016-01-10 ENCOUNTER — Ambulatory Visit: Payer: BLUE CROSS/BLUE SHIELD | Admitting: Urology

## 2016-01-10 ENCOUNTER — Other Ambulatory Visit: Payer: Self-pay

## 2016-01-11 ENCOUNTER — Ambulatory Visit (INDEPENDENT_AMBULATORY_CARE_PROVIDER_SITE_OTHER): Payer: BLUE CROSS/BLUE SHIELD | Admitting: Urology

## 2016-01-11 ENCOUNTER — Encounter: Payer: Self-pay | Admitting: Urology

## 2016-01-11 VITALS — BP 160/92 | HR 69 | Ht 70.0 in | Wt 264.7 lb

## 2016-01-11 DIAGNOSIS — N529 Male erectile dysfunction, unspecified: Secondary | ICD-10-CM

## 2016-01-11 DIAGNOSIS — N400 Enlarged prostate without lower urinary tract symptoms: Secondary | ICD-10-CM | POA: Diagnosis not present

## 2016-01-11 DIAGNOSIS — E291 Testicular hypofunction: Secondary | ICD-10-CM

## 2016-01-11 DIAGNOSIS — N528 Other male erectile dysfunction: Secondary | ICD-10-CM | POA: Diagnosis not present

## 2016-01-11 DIAGNOSIS — N138 Other obstructive and reflux uropathy: Secondary | ICD-10-CM

## 2016-01-11 DIAGNOSIS — N401 Enlarged prostate with lower urinary tract symptoms: Secondary | ICD-10-CM | POA: Diagnosis not present

## 2016-01-11 DIAGNOSIS — Z940 Kidney transplant status: Secondary | ICD-10-CM | POA: Diagnosis not present

## 2016-01-11 LAB — BLADDER SCAN AMB NON-IMAGING: SCAN RESULT: 78

## 2016-01-11 MED ORDER — TESTOSTERONE CYPIONATE 200 MG/ML IM SOLN
200.0000 mg | Freq: Once | INTRAMUSCULAR | Status: DC
  Administered 2016-01-11: 500 mg via INTRAMUSCULAR

## 2016-01-11 MED ORDER — SILDENAFIL CITRATE 100 MG PO TABS: 100.0000 mg | ORAL_TABLET | Freq: Every day | ORAL | Status: DC | PRN

## 2016-01-11 NOTE — Progress Notes (Signed)
Testosterone IM Injection  Due to Hypogonadism patient is present today for a Testosterone Injection.  Medication: Testosterone Cypionate Dose: 500 mg/ 2.64ml Location: left upper outer buttocks Lot: [id number] Exp:06/2017  Patient tolerated well, no complications were noted  Preformed by: Lyndee Hensen CMS  Follow up: Three Weeks

## 2016-01-11 NOTE — Progress Notes (Signed)
Testosterone IM Injection  Due to Hypogonadism patient is present today for a Testosterone Injection.  Medication: Testosterone Cypionate Dose: 59mL Location: right upper outer buttocks Lot: [id number] Exp:06/2017  Patient tolerated well, no complications were noted  Preformed by: Toniann Fail, LPN   Follow up: 3 weeks

## 2016-01-11 NOTE — Progress Notes (Signed)
01/11/2016 12:01 PM   Chad Mccann [birth year] [id number]  Referring provider: Shepard General, MD [facility] [address] [place], [place] [address]  Chief Complaint  Patient presents with  . Hypogonadism    6 month rechceck  . Benign Prostatic Hypertrophy    HPI: Patient is a 50 year old Caucasian male with a history of a renal transplant, erectile dysfunction, hypogonadism and BPH with LUTS who presents today for his 6 months recheck.  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 His recent serum creatinine is 1.29. Renal ultrasound performed on 12/05/2015 noted native kidneys atrophic with cortical thinning an increased echotexture. No hydronephrosis. No hydronephrosis within the transplanted kidney. Patient's nephrologist believes the pain is musculoskeletal.    Erectile dysfunction His SHIM score is 13, which is moderate erectile dysfunction.   He has been having difficulty with erections for the last several years.   His major complaint is maintaining an erections.  His libido is preserved.   His risk factors for ED are hyperlipidemia, HTN, hypogonadism and age .  He denies any painful erections or curvatures with his erections.   He has tried PDE5-inhibitors in the past with good results.        SHIM      01/11/16 1151       SHIM: Over the last 6 months:   How do you rate your confidence that you could get and keep an erection? Very Low     When you had erections with sexual stimulation, how often were your erections hard enough for penetration (entering your partner)? Sometimes (about half the time)     During sexual intercourse, how often were you able to maintain your erection after you had penetrated (entered) your partner? Difficult     During sexual intercourse, how difficult was it to  maintain your erection to completion of intercourse? Difficult     When you attempted sexual intercourse, how often was it satisfactory for you? Difficult     SHIM Total Score   SHIM 13        Score: 1-7 Severe ED 8-11 Moderate ED 12-16 Mild-Moderate ED 17-21 Mild ED 22-25 No ED  Hypogonadism Patient presented  with the symptoms of reduced libido, erectile dysfunction,  an increase of body fat, a decrease in physical and work performance, disturbances in sleep patterns, decreased energy and motivation, a decrease in cognitive function and mood changes.  This is indicated by his responses to the ADAM questionnaire.  His pretreatment testosterone level was 116 ng/dL on 05/14/2012.  He is currently managing his hypogonadism with testosterone cypionate 200 mg IM every three weeks.         Androgen Deficiency in the Aging Male      01/11/16 1100       Androgen Deficiency in the Aging Male   Do you have a decrease in libido (sex drive) Yes     Do you have lack of energy Yes     Do you have a decrease in strength and/or endurance Yes     Have you lost height No     Have you noticed a decreased "enjoyment of life" No     Are you sad and/or grumpy No     Are your erections less strong Yes     Have you noticed a recent deterioration  in your ability to play sports Yes     Are you falling asleep after dinner Yes     Has there been a recent deterioration in your work performance Yes        BPH WITH LUTS His IPSS score today is 8, which is moderate lower urinary tract symptomatology. He is pleased with his quality life due to his urinary symptoms.  His PVR is 70 mL.  He denies any dysuria, hematuria or suprapubic pain.  He also denies any recent fevers, chills, nausea or vomiting.  He does not have a family history of PCa.      IPSS      01/11/16 1100       International Prostate Symptom Score   How often have you had the sensation of not emptying your bladder? Not at All     How  often have you had to urinate less than every two hours? Less than 1 in 5 times     How often have you found you stopped and started again several times when you urinated? Less than 1 in 5 times     How often have you found it difficult to postpone urination? About half the time     How often have you had a weak urinary stream? Less than 1 in 5 times     How often have you had to strain to start urination? Not at All     How many times did you typically get up at night to urinate? 2 Times     Total IPSS Score 8     Quality of Life due to urinary symptoms   If you were to spend the rest of your life with your urinary condition just the way it is now how would you feel about that? Pleased        Score:  1-7 Mild 8-19 Moderate 20-35 Severe       PMH: Past Medical History  Diagnosis Date  . Other specified congenital anomaly of kidney   . Other testicular hypofunction   . Hypertension   . Sleep apnea   . Hypogonadism in male   . Premature ejaculation   . Monoclonal paraproteinemia   . Obesity, unspecified   . Other specified disorders of bladder   . Benign prostatic hypertrophy   . Bladder spasm   . Kidney 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   . MGUS (monoclonal gammopathy of unknown significance)   . Kidney, malrotation   . Erectile dysfunction   . Ventral hernia   . Frequency     Surgical History: Past Surgical History  Procedure Laterality Date  . Colonoscopy      polyp removal  . Kidney transplant  01/2012  . Spine surgery      cervical fusion  . Dialysis port placement    . Hernia repair  12/29/13    ventral    Home Medications:    Medication List       This list is accurate as of: 01/11/16 11:59 PM.  Always use your most recent med list.               amLODipine 5 MG tablet  Commonly known as:  NORVASC  Take 10 mg by mouth.     amLODipine-benazepril 5-10 MG capsule  Commonly known as:  LOTREL  Take 1 capsule by mouth daily. Reported on 01/11/2016      aspirin 81 MG tablet  Take 81 mg by mouth daily.  atorvastatin 10 MG tablet  Commonly known as:  LIPITOR  Take 10 mg by mouth daily.     CIALIS 20 MG tablet  Generic drug:  tadalafil  TAKE ONE TABLET ONE HOUR PRIOR TO SEXUALACTIVITY     clonazePAM 0.5 MG tablet  Commonly known as:  KLONOPIN  Take 0.5 mg by mouth 2 (two) times daily as needed for anxiety.     coal tar-salicylic acid 2 % shampoo  Reported on 01/11/2016     fluorouracil 5 % cream  Commonly known as:  EFUDEX  Apply to warts in the morning.     metoprolol 50 MG tablet  Commonly known as:  LOPRESSOR  Take 50 mg by mouth.     mycophenolate 500 MG tablet  Commonly known as:  CELLCEPT  Take 750 mg by mouth 2 (two) times daily.     omega-3 acid ethyl esters 1 g capsule  Commonly known as:  LOVAZA  Take 1 g by mouth 2 (two) times daily.     omeprazole 20 MG capsule  Commonly known as:  PRILOSEC  Take 20 mg by mouth daily.     sildenafil 100 MG tablet  Commonly known as:  VIAGRA  Take 1 tablet (100 mg total) by mouth daily as needed for erectile dysfunction.     tacrolimus 1 MG capsule  Commonly known as:  PROGRAF  Take 4 mg by mouth 2 (two) times daily.     testosterone cypionate 200 MG/ML injection  Commonly known as:  DEPOTESTOSTERONE CYPIONATE  Inject 2.5 mLs (500 mg total) into the muscle every 14 (fourteen) days.     zinc sulfate 220 MG capsule  Take 220 mg by mouth. Reported on 01/11/2016        Allergies:  Allergies  Allergen Reactions  . Hydralazine   . Morphine And Related   . Prednisone     hallucinations  . Shellfish Allergy Nausea And Vomiting    Family History: Family History  Problem Relation Age of Onset  . Colon cancer Father 33  . Heart attack Father   . Stroke Brother   . Kidney disease Neg Hx   . Prostate cancer Neg Hx     Social History:  reports that he has never smoked. He has never used smokeless tobacco. He reports that he drinks alcohol. He reports that he  does not use illicit drugs.  ROS: UROLOGY Frequent Urination?: No Hard to postpone urination?: No Burning/pain with urination?: No Get up at night to urinate?: No Leakage of urine?: No Urine stream starts and stops?: No Trouble starting stream?: No Do you have to strain to urinate?: No Blood in urine?: No Urinary tract infection?: No Sexually transmitted disease?: No Injury to kidneys or bladder?: No Painful intercourse?: No Weak stream?: No Erection problems?: No Penile pain?: No  Gastrointestinal Nausea?: No Vomiting?: No Indigestion/heartburn?: No Diarrhea?: No Constipation?: No  Constitutional Fever: No Night sweats?: No Weight loss?: No Fatigue?: No  Skin Skin rash/lesions?: No Itching?: No  Eyes Blurred vision?: No Double vision?: No  Ears/Nose/Throat Sore throat?: No Sinus problems?: No  Hematologic/Lymphatic Swollen glands?: No Easy bruising?: No  Cardiovascular Leg swelling?: No Chest pain?: No  Respiratory Cough?: No Shortness of breath?: No  Endocrine Excessive thirst?: No  Musculoskeletal Back pain?: Yes Joint pain?: No  Neurological Headaches?: Yes Dizziness?: No  Psychologic Depression?: No Anxiety?: No  Physical Exam: BP 160/92 mmHg  Pulse 69  Ht 5\' 10"  (1.778 m)  Wt 264 lb  11.2 oz (120.067 kg)  BMI 37.98 kg/m2  Constitutional: Well nourished. Alert and oriented, No acute distress. HEENT: [place] AT, moist mucus membranes. Trachea midline, no masses. Cardiovascular: No clubbing, cyanosis, or edema. Respiratory: Normal respiratory effort, no increased work of breathing. GI: Abdomen is soft, non tender, non distended, no abdominal masses. Liver and spleen not palpable.  No hernias appreciated.  Stool sample for occult testing is not indicated.   GU: No CVA tenderness.  No bladder fullness or masses.  Patient with circumcised phallus.   Urethral meatus is patent.  No penile discharge. No penile lesions or rashes. Scrotum  without lesions, cysts, rashes and/or edema.  Testicles are located scrotally bilaterally. No masses are appreciated in the testicles. Left and right epididymis are normal. Rectal: Patient with  normal sphincter tone. Anus and perineum without scarring or rashes. No rectal masses are appreciated. Prostate is approximately 35 grams, no nodules are appreciated. Seminal vesicles are normal. Skin: No rashes, bruises or suspicious lesions. Lymph: No cervical or inguinal adenopathy. Neurologic: Grossly intact, no focal deficits, moving all 4 extremities. Psychiatric: Normal mood and affect.  Laboratory Data: PSA History  0.9 ng/mL on 07/10/2015  0.9 ng/mL on 11/28/2015   Lab Results  Component Value Date   WBC 5.2 12/27/2013   HGB 16.9 12/27/2013   HCT 48.7 12/05/2015   MCV 89 12/27/2013   PLT 208 12/27/2013    Lab Results  Component Value Date   CREATININE 1.29* 11/28/2015   Lab Results  Component Value Date   TESTOSTERONE 442 12/05/2015   Lab Results  Component Value Date   TSH 2.290 11/28/2015   Lab Results  Component Value Date   AST 11 11/28/2015   Lab Results  Component Value Date   ALT 22 11/28/2015    Assessment & Plan:    1. 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 His recent serum creatinine is 1.29. Renal ultrasound performed on 12/05/2015 noted native kidneys atrophic with cortical thinning an increased echotexture. No hydronephrosis. No hydronephrosis within the transplanted kidney. Patient's nephrologist believes the pain is musculoskeletal.    2. Erectile dysfunction:   SHIM score is 13.  He has good success with PDE5-inhibitors.  He will continue the Viagra.  We will repeat the SHIM score and exam in 6 months.  3. Hypogonadism:   He is currently managing his hypogonadism with testosterone cypionate 200 mg IM  every three weeks.   He has received an injection today and will RTC in 3 months for a HCT and testosterone level.    4. BPH (benign prostatic hyperplasia) with LUTS:   IPSS score is 8/1.  His PVR is 70 mL.  He will RTC in 6 months for IPSS score, exam and PSA.    - BLADDER SCAN AMB NON-IMAGING   Return in about 3 months (around 04/10/2016) for HCT and testosterone blood drawn.  These notes generated with voice recognition software. I apologize for typographical errors.  Zara Council, [facility] [address] [place], [place] [address] [phone]

## 2016-02-01 ENCOUNTER — Ambulatory Visit (INDEPENDENT_AMBULATORY_CARE_PROVIDER_SITE_OTHER): Payer: BLUE CROSS/BLUE SHIELD

## 2016-02-01 DIAGNOSIS — E291 Testicular hypofunction: Secondary | ICD-10-CM

## 2016-02-01 MED ORDER — TESTOSTERONE CYPIONATE 200 MG/ML IM SOLN
200.0000 mg | Freq: Once | INTRAMUSCULAR | Status: AC
  Administered 2016-02-01: 200 mg via INTRAMUSCULAR

## 2016-02-01 NOTE — Progress Notes (Signed)
Testosterone IM Injection  Due to Hypogonadism patient is present today for a Testosterone Injection.  Medication: Testosterone Cypionate Dose: 21mL Location: right upper outer buttocks Lot: [id number] Exp:06/2017  Patient tolerated well, no complications were noted  Preformed by: Toniann Fail, LPN   Follow up: 3 weeks

## 2016-02-10 ENCOUNTER — Emergency Department: Payer: BLUE CROSS/BLUE SHIELD

## 2016-02-10 ENCOUNTER — Encounter: Payer: Self-pay | Admitting: Emergency Medicine

## 2016-02-10 ENCOUNTER — Observation Stay
Admission: EM | Admit: 2016-02-10 | Discharge: 2016-02-12 | Disposition: A | Discharge status: Home / Self Care | Payer: BLUE CROSS/BLUE SHIELD | Attending: Internal Medicine | Admitting: Internal Medicine

## 2016-02-10 DIAGNOSIS — Z888 Allergy status to other drugs, medicaments and biological substances status: Secondary | ICD-10-CM | POA: Insufficient documentation

## 2016-02-10 DIAGNOSIS — Z79899 Other long term (current) drug therapy: Secondary | ICD-10-CM | POA: Insufficient documentation

## 2016-02-10 DIAGNOSIS — D472 Monoclonal gammopathy: Secondary | ICD-10-CM | POA: Insufficient documentation

## 2016-02-10 DIAGNOSIS — Z91013 Allergy to seafood: Secondary | ICD-10-CM | POA: Insufficient documentation

## 2016-02-10 DIAGNOSIS — G4730 Sleep apnea, unspecified: Secondary | ICD-10-CM | POA: Diagnosis not present

## 2016-02-10 DIAGNOSIS — E785 Hyperlipidemia, unspecified: Secondary | ICD-10-CM | POA: Insufficient documentation

## 2016-02-10 DIAGNOSIS — N529 Male erectile dysfunction, unspecified: Secondary | ICD-10-CM | POA: Insufficient documentation

## 2016-02-10 DIAGNOSIS — N400 Enlarged prostate without lower urinary tract symptoms: Secondary | ICD-10-CM | POA: Diagnosis not present

## 2016-02-10 DIAGNOSIS — E669 Obesity, unspecified: Secondary | ICD-10-CM | POA: Insufficient documentation

## 2016-02-10 DIAGNOSIS — Z7982 Long term (current) use of aspirin: Secondary | ICD-10-CM | POA: Insufficient documentation

## 2016-02-10 DIAGNOSIS — Z6836 Body mass index (BMI) 36.0-36.9, adult: Secondary | ICD-10-CM | POA: Insufficient documentation

## 2016-02-10 DIAGNOSIS — K5730 Diverticulosis of large intestine without perforation or abscess without bleeding: Secondary | ICD-10-CM | POA: Insufficient documentation

## 2016-02-10 DIAGNOSIS — Z823 Family history of stroke: Secondary | ICD-10-CM | POA: Insufficient documentation

## 2016-02-10 DIAGNOSIS — R109 Unspecified abdominal pain: Secondary | ICD-10-CM | POA: Diagnosis present

## 2016-02-10 DIAGNOSIS — Z981 Arthrodesis status: Secondary | ICD-10-CM | POA: Insufficient documentation

## 2016-02-10 DIAGNOSIS — I10 Essential (primary) hypertension: Secondary | ICD-10-CM | POA: Insufficient documentation

## 2016-02-10 DIAGNOSIS — K5732 Diverticulitis of large intestine without perforation or abscess without bleeding: Secondary | ICD-10-CM | POA: Diagnosis present

## 2016-02-10 DIAGNOSIS — Z940 Kidney transplant status: Secondary | ICD-10-CM | POA: Insufficient documentation

## 2016-02-10 DIAGNOSIS — Z8249 Family history of ischemic heart disease and other diseases of the circulatory system: Secondary | ICD-10-CM | POA: Diagnosis not present

## 2016-02-10 DIAGNOSIS — Z9889 Other specified postprocedural states: Secondary | ICD-10-CM | POA: Insufficient documentation

## 2016-02-10 DIAGNOSIS — R1032 Left lower quadrant pain: Secondary | ICD-10-CM | POA: Diagnosis not present

## 2016-02-10 DIAGNOSIS — Z885 Allergy status to narcotic agent status: Secondary | ICD-10-CM | POA: Diagnosis not present

## 2016-02-10 DIAGNOSIS — Z800 Family history of malignant neoplasm of digestive organs: Secondary | ICD-10-CM | POA: Insufficient documentation

## 2016-02-10 LAB — URINALYSIS COMPLETE WITH MICROSCOPIC (ARMC ONLY)
BACTERIA UA: NONE SEEN
Bilirubin Urine: NEGATIVE
Glucose, UA: 50 mg/dL — AB
HGB URINE DIPSTICK: NEGATIVE
KETONES UR: NEGATIVE mg/dL
LEUKOCYTES UA: NEGATIVE
Nitrite: NEGATIVE
PH: 6 (ref 5.0–8.0)
PROTEIN: NEGATIVE mg/dL
SPECIFIC GRAVITY, URINE: 1.021 (ref 1.005–1.030)
SQUAMOUS EPITHELIAL / LPF: NONE SEEN

## 2016-02-10 LAB — COMPREHENSIVE METABOLIC PANEL
ALT: 21 U/L (ref 17–63)
ANION GAP: 6 (ref 5–15)
AST: 16 U/L (ref 15–41)
Albumin: 4.3 g/dL (ref 3.5–5.0)
Alkaline Phosphatase: 61 U/L (ref 38–126)
BUN: 14 mg/dL (ref 6–20)
CHLORIDE: 107 mmol/L (ref 101–111)
CO2: 25 mmol/L (ref 22–32)
Calcium: 8.9 mg/dL (ref 8.9–10.3)
Creatinine, Ser: 1.14 mg/dL (ref 0.61–1.24)
GFR calc Af Amer: >60 mL/min (ref >60)
Glucose, Bld: 95 mg/dL (ref 65–99)
POTASSIUM: 4 mmol/L (ref 3.5–5.1)
Sodium: 138 mmol/L (ref 135–145)
Total Bilirubin: 1 mg/dL (ref 0.3–1.2)
Total Protein: 7.4 g/dL (ref 6.5–8.1)

## 2016-02-10 LAB — CBC
HEMATOCRIT: 47.6 % (ref 40.0–52.0)
HEMOGLOBIN: 16 g/dL (ref 13.0–18.0)
MCH: 28.2 pg (ref 26.0–34.0)
MCHC: 33.6 g/dL (ref 32.0–36.0)
MCV: 83.9 fL (ref 80.0–100.0)
Platelets: 213 10*3/uL (ref 150–440)
RBC: 5.67 MIL/uL (ref 4.40–5.90)
RDW: 16 % — AB (ref 11.5–14.5)
WBC: 6 10*3/uL (ref 3.8–10.6)

## 2016-02-10 LAB — LIPASE, BLOOD: LIPASE: 42 U/L (ref 11–51)

## 2016-02-10 LAB — TROPONIN I

## 2016-02-10 MED ORDER — SODIUM CHLORIDE 0.9 % IV SOLN
1000.0000 mL | Freq: Once | INTRAVENOUS | Status: AC
  Administered 2016-02-10: 1000 mL via INTRAVENOUS

## 2016-02-10 MED ORDER — IOHEXOL 240 MG/ML SOLN
25.0000 mL | INTRAMUSCULAR | Status: AC
  Administered 2016-02-10: 25 mL via ORAL

## 2016-02-10 MED ORDER — HYDROMORPHONE HCL 1 MG/ML IJ SOLN
0.5000 mg | Freq: Once | INTRAMUSCULAR | Status: AC
  Administered 2016-02-10: 0.5 mg via INTRAVENOUS

## 2016-02-10 MED ORDER — ONDANSETRON HCL 4 MG/2ML IJ SOLN
INTRAMUSCULAR | Status: AC
Start: 2016-02-10 — End: 2016-02-10
  Administered 2016-02-10: 4 mg via INTRAVENOUS
  Filled 2016-02-10: qty 2

## 2016-02-10 MED ORDER — ONDANSETRON HCL 4 MG/2ML IJ SOLN
4.0000 mg | Freq: Once | INTRAMUSCULAR | Status: AC
  Administered 2016-02-10: 4 mg via INTRAVENOUS

## 2016-02-10 MED ORDER — HYDROMORPHONE HCL 1 MG/ML IJ SOLN
INTRAMUSCULAR | Status: AC
  Administered 2016-02-10: 0.5 mg via INTRAVENOUS
  Filled 2016-02-10: qty 1

## 2016-02-10 NOTE — ED Provider Notes (Signed)
[facility] Emergency Department Provider Note  ____________________________________________    I have reviewed the triage vital signs and the nursing notes.   HISTORY  Chief Complaint Abdominal Pain    HPI Chad Mccann is a 50 y.o. male who presents with left lower quadrant abdominal pain. He reports it is been progressing over the last 2 days and is severe today. He reports he is unable to stand up straight because it hurt so much. She is never had this before. He does have a history of kidney transplant and is concerned that his transplanted kidney is the cause of the problem. He reports compliance with his immunosuppressants. No fevers or chills. No dysuria. No nausea or vomiting. No history of the same.     Past Medical History  Diagnosis Date  . Other specified congenital anomaly of kidney   . Other testicular hypofunction   . Hypertension   . Sleep apnea   . Hypogonadism in male   . Premature ejaculation   . Monoclonal paraproteinemia   . Obesity, unspecified   . Other specified disorders of bladder   . Benign prostatic hypertrophy   . Bladder spasm   . Kidney 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   . MGUS (monoclonal gammopathy of unknown significance)   . Kidney, malrotation   . Erectile dysfunction   . Ventral hernia   . Frequency     Patient Active Problem List   Diagnosis Date Noted  . Notalgia 12/10/2015  . History of transplantation, renal 12/10/2015  . Acid reflux 07/10/2015  . 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 BPH with obstruction/lower urinary tract symptoms 07/10/2015  . Hypogonadism in male 07/10/2015  . Erectile dysfunction of organic origin 07/10/2015  . Ventral hernia 12/05/2013  . MGUS (monoclonal gammopathy of unknown significance) 06/28/2013  . Focal and segmental hyalinosis 04/21/2013  . H/O kidney transplant 01/28/2012  . Adiposity 02/12/2011  . Obstructive apnea 02/12/2011  . Essential (primary) hypertension 09/05/2010     Past Surgical History  Procedure Laterality Date  . Colonoscopy      polyp removal  . Kidney transplant  01/2012  . Spine surgery      cervical fusion  . Dialysis port placement    . Hernia repair  12/29/13    ventral    Current Outpatient Rx  Name  Route  Sig  Dispense  Refill  . amLODipine (NORVASC) 5 MG tablet   Oral   Take 10 mg by mouth.         Marland Kitchen amLODipine-benazepril (LOTREL) 5-10 MG per capsule   Oral   Take 1 capsule by mouth daily. Reported on 01/11/2016         . aspirin 81 MG tablet   Oral   Take 81 mg by mouth daily.         Marland Kitchen atorvastatin (LIPITOR) 10 MG tablet   Oral   Take 10 mg by mouth daily.         Marland Kitchen CIALIS 20 MG tablet      TAKE ONE TABLET ONE HOUR PRIOR TO SEXUALACTIVITY   6 tablet   12   . clonazePAM (KLONOPIN) 0.5 MG tablet   Oral   Take 0.5 mg by mouth 2 (two) times daily as needed for anxiety.         . coal tar-salicylic acid 2 % shampoo      Reported on 01/11/2016         . fluorouracil (EFUDEX) 5 % cream  Apply to warts in the morning.         . metoprolol (LOPRESSOR) 50 MG tablet   Oral   Take 50 mg by mouth.         . mycophenolate (CELLCEPT) 500 MG tablet   Oral   Take 750 mg by mouth 2 (two) times daily.         Marland Kitchen omega-3 acid ethyl esters (LOVAZA) 1 G capsule   Oral   Take 1 g by mouth 2 (two) times daily.         Marland Kitchen omeprazole (PRILOSEC) 20 MG capsule   Oral   Take 20 mg by mouth daily.         . sildenafil (VIAGRA) 100 MG tablet   Oral   Take 1 tablet (100 mg total) by mouth daily as needed for erectile dysfunction.   6 tablet   12   . tacrolimus (PROGRAF) 1 MG capsule   Oral   Take 4 mg by mouth 2 (two) times daily.         Marland Kitchen testosterone cypionate (DEPOTESTOSTERONE CYPIONATE) 200 MG/ML injection   Intramuscular   Inject 2.5 mLs (500 mg total) into the muscle every 14 (fourteen) days.   10 mL   0   . zinc sulfate 220 MG capsule   Oral   Take 220 mg by mouth. Reported on  01/11/2016           Allergies Hydralazine; Morphine and related; Prednisone; and Shellfish allergy  Family History  Problem Relation Age of Onset  . Colon cancer Father 24  . Heart attack Father   . Stroke Brother   . Kidney disease Neg Hx   . Prostate cancer Neg Hx     Social History Social History  Substance Use Topics  . Smoking status: Never Smoker   . Smokeless tobacco: Never Used  . Alcohol Use: 0.0 oz/week    0 Standard drinks or equivalent per week     Comment: occ    Review of Systems  Constitutional: Negative for fever. Eyes: Negative for visual changes. ENT: Negative for sore throat Cardiovascular: Negative for chest pain. Respiratory: Negative for shortness of breath. Gastrointestinal: Negative for abdominal pain, vomiting and diarrhea. Genitourinary: Negative for dysuria. Musculoskeletal: Negative for back pain. Skin: Negative for rash. Neurological: Negative for headaches or focal weakness Psychiatric: Positive for anxiety    ____________________________________________   PHYSICAL EXAM:  VITAL SIGNS: ED Triage Vitals  Enc Vitals Group     BP 02/10/16 1810 188/86 mmHg     Pulse Rate 02/10/16 1810 83     Resp 02/10/16 1810 20     Temp 02/10/16 1810 97.5 F (36.4 C)     Temp Source 02/10/16 1810 Oral     SpO2 02/10/16 1810 98 %     Weight 02/10/16 1810 260 lb (117.935 kg)     Height 02/10/16 1810 5\' 11"  ([id number] m)     Head Cir --      Peak Flow --      Pain Score 02/10/16 1812 10     Pain Loc --      Pain Edu? --      Excl. in [place]? --      Constitutional: Alert and oriented. Well appearing and in no distress. Eyes: Conjunctivae are normal.  ENT   Head: Normocephalic and atraumatic.   Mouth/Throat: Mucous membranes are moist. Cardiovascular: Normal rate, regular rhythm. Normal and symmetric distal pulses are present in all  extremities. Respiratory: Normal respiratory effort without tachypnea nor retractions. Gastrointestinal:  Significant tenderness in the left lower quadrant. No distention. Genitourinary: deferred Musculoskeletal: Nontender with normal range of motion in all extremities.  Neurologic:  Normal speech and language. No gross focal neurologic deficits are appreciated. Skin:  Skin is warm, dry and intact. No rash noted. Psychiatric: Mood and affect are normal. Patient exhibits appropriate insight and judgment.  ____________________________________________    LABS (pertinent positives/negatives)  Labs Reviewed  CBC - Abnormal; Notable for the following:    RDW 16.0 (*)    All other components within normal limits  URINALYSIS COMPLETEWITH MICROSCOPIC ([facility] ONLY) - Abnormal; Notable for the following:    Color, Urine YELLOW (*)    APPearance CLEAR (*)    Glucose, UA 50 (*)    All other components within normal limits  LIPASE, BLOOD  COMPREHENSIVE METABOLIC PANEL  TROPONIN I    ____________________________________________   EKG  ED ECG REPORT I, Lavonia Drafts, the attending physician, personally viewed and interpreted this ECG.  Date: 02/10/2016 EKG Time: 6:17 PM Rate: 78 Rhythm: normal sinus rhythm QRS Axis: normal Intervals: normal ST/T Wave abnormalities: normal Conduction Disturbances: none Narrative Interpretation: unremarkable   ____________________________________________    RADIOLOGY I have personally reviewed any xrays that were ordered on this patient: CT scan consistent with diverticulitis versus epiploic appendagitis  ____________________________________________   PROCEDURES  Procedure(s) performed: none  Critical Care performed: none  ____________________________________________   INITIAL IMPRESSION / ASSESSMENT AND PLAN / ED COURSE  Pertinent labs & imaging results that were available during my care of the patient were reviewed by me and considered in my medical decision making (see chart for details).  Patient presents with left lower quadrant  tender to palpation, I'm suspicious for diverticulitis. We will give IV Dilaudid for his significant pain and obtain CT abdomen pelvis with by mouth contrast only (discussed with radiologist).  ----------------------------------------- 12:06 AM on 02/11/2016 -----------------------------------------  Patient continues to require IV pain medication. CT scan is consistent with epiploic appendicitis versus diverticulitis. I'll discuss with hospitalist for admission given his significant pain  ____________________________________________   FINAL CLINICAL IMPRESSION(S) / ED DIAGNOSES  Final diagnoses:  Diverticulitis of large intestine without perforation or abscess without bleeding     Lavonia Drafts, MD 02/11/16 [phone]

## 2016-02-10 NOTE — ED Notes (Signed)
Abdominal pain x 2 days, lower L side which is where site of transplanted kidney. Has kidney transplant in 2013

## 2016-02-10 NOTE — ED Notes (Signed)
Patient transported to CT 

## 2016-02-11 ENCOUNTER — Encounter: Payer: Self-pay | Admitting: Internal Medicine

## 2016-02-11 DIAGNOSIS — R109 Unspecified abdominal pain: Secondary | ICD-10-CM | POA: Diagnosis present

## 2016-02-11 LAB — BASIC METABOLIC PANEL
ANION GAP: 5 (ref 5–15)
BUN: 11 mg/dL (ref 6–20)
CALCIUM: 8.4 mg/dL — AB (ref 8.9–10.3)
CO2: 28 mmol/L (ref 22–32)
CREATININE: 0.96 mg/dL (ref 0.61–1.24)
Chloride: 107 mmol/L (ref 101–111)
Glucose, Bld: 92 mg/dL (ref 65–99)
Potassium: 3.8 mmol/L (ref 3.5–5.1)
SODIUM: 140 mmol/L (ref 135–145)

## 2016-02-11 LAB — CBC
HCT: 42.9 % (ref 40.0–52.0)
Hemoglobin: 14.4 g/dL (ref 13.0–18.0)
MCH: 28.3 pg (ref 26.0–34.0)
MCHC: 33.4 g/dL (ref 32.0–36.0)
MCV: 84.7 fL (ref 80.0–100.0)
PLATELETS: 184 10*3/uL (ref 150–440)
RBC: 5.07 MIL/uL (ref 4.40–5.90)
RDW: 15.8 % — AB (ref 11.5–14.5)
WBC: 4.2 10*3/uL (ref 3.8–10.6)

## 2016-02-11 MED ORDER — HYDROMORPHONE HCL 1 MG/ML IJ SOLN
INTRAMUSCULAR | Status: AC
  Administered 2016-02-11: 1 mg via INTRAVENOUS
  Filled 2016-02-11: qty 1

## 2016-02-11 MED ORDER — CLONAZEPAM 0.5 MG PO TABS
0.5000 mg | ORAL_TABLET | Freq: Two times a day (BID) | ORAL | Status: DC
  Administered 2016-02-11 – 2016-02-12 (×3): 0.5 mg via ORAL
  Filled 2016-02-11 (×3): qty 1

## 2016-02-11 MED ORDER — TACROLIMUS 1 MG PO CAPS
4.0000 mg | ORAL_CAPSULE | Freq: Two times a day (BID) | ORAL | Status: DC
  Filled 2016-02-11 (×3): qty 4

## 2016-02-11 MED ORDER — PANTOPRAZOLE SODIUM 40 MG PO TBEC
40.0000 mg | DELAYED_RELEASE_TABLET | Freq: Every day | ORAL | Status: DC
  Administered 2016-02-11 – 2016-02-12 (×2): 40 mg via ORAL
  Filled 2016-02-11 (×2): qty 1

## 2016-02-11 MED ORDER — HYDROMORPHONE HCL 1 MG/ML IJ SOLN
1.0000 mg | Freq: Once | INTRAMUSCULAR | Status: AC
  Administered 2016-02-11: 1 mg via INTRAVENOUS

## 2016-02-11 MED ORDER — SENNA 8.6 MG PO TABS
1.0000 | ORAL_TABLET | Freq: Two times a day (BID) | ORAL | Status: DC
  Administered 2016-02-11 – 2016-02-12 (×4): 8.6 mg via ORAL
  Filled 2016-02-11 (×4): qty 1

## 2016-02-11 MED ORDER — HYDROMORPHONE HCL 1 MG/ML IJ SOLN
1.0000 mg | INTRAMUSCULAR | Status: DC | PRN
  Administered 2016-02-11 – 2016-02-12 (×7): 1 mg via INTRAVENOUS
  Filled 2016-02-11 (×7): qty 1

## 2016-02-11 MED ORDER — ASPIRIN EC 81 MG PO TBEC
81.0000 mg | DELAYED_RELEASE_TABLET | Freq: Every day | ORAL | Status: DC
  Administered 2016-02-11 – 2016-02-12 (×2): 81 mg via ORAL
  Filled 2016-02-11 (×2): qty 1

## 2016-02-11 MED ORDER — MYCOPHENOLATE MOFETIL 250 MG PO CAPS
750.0000 mg | ORAL_CAPSULE | Freq: Two times a day (BID) | ORAL | Status: DC
  Administered 2016-02-11 – 2016-02-12 (×3): 750 mg via ORAL
  Filled 2016-02-11 (×4): qty 3

## 2016-02-11 MED ORDER — LEVOFLOXACIN IN D5W 750 MG/150ML IV SOLN
750.0000 mg | Freq: Every day | INTRAVENOUS | Status: DC
  Administered 2016-02-11: 750 mg via INTRAVENOUS
  Filled 2016-02-11 (×2): qty 150

## 2016-02-11 MED ORDER — SODIUM CHLORIDE 0.9 % IV SOLN
INTRAVENOUS | Status: DC
Start: 2016-02-11 — End: 2016-02-12
  Administered 2016-02-11 – 2016-02-12 (×4): via INTRAVENOUS

## 2016-02-11 MED ORDER — ATORVASTATIN CALCIUM 10 MG PO TABS
10.0000 mg | ORAL_TABLET | Freq: Every day | ORAL | Status: DC
  Administered 2016-02-11 – 2016-02-12 (×2): 10 mg via ORAL
  Filled 2016-02-11 (×2): qty 1

## 2016-02-11 MED ORDER — ACETAMINOPHEN 325 MG PO TABS
650.0000 mg | ORAL_TABLET | Freq: Four times a day (QID) | ORAL | Status: DC | PRN
Start: 2016-02-11 — End: 2016-02-12

## 2016-02-11 MED ORDER — ONDANSETRON HCL 4 MG PO TABS: 4.0000 mg | ORAL_TABLET | Freq: Four times a day (QID) | ORAL | Status: DC | PRN

## 2016-02-11 MED ORDER — LEVOFLOXACIN IN D5W 750 MG/150ML IV SOLN
750.0000 mg | Freq: Once | INTRAVENOUS | Status: AC
  Administered 2016-02-11: 750 mg via INTRAVENOUS
  Filled 2016-02-11: qty 150

## 2016-02-11 MED ORDER — ACETAMINOPHEN 650 MG RE SUPP: 650.0000 mg | Freq: Four times a day (QID) | RECTAL | Status: DC | PRN

## 2016-02-11 MED ORDER — AMLODIPINE BESYLATE 10 MG PO TABS
10.0000 mg | ORAL_TABLET | Freq: Every day | ORAL | Status: DC
  Administered 2016-02-11 – 2016-02-12 (×2): 10 mg via ORAL
  Filled 2016-02-11 (×2): qty 1

## 2016-02-11 MED ORDER — TACROLIMUS 1 MG PO CAPS
2.0000 mg | ORAL_CAPSULE | Freq: Two times a day (BID) | ORAL | Status: DC
  Administered 2016-02-11 – 2016-02-12 (×3): 2 mg via ORAL
  Filled 2016-02-11 (×5): qty 2

## 2016-02-11 MED ORDER — ENOXAPARIN SODIUM 40 MG/0.4ML ~~LOC~~ SOLN
40.0000 mg | Freq: Every day | SUBCUTANEOUS | Status: DC
  Administered 2016-02-11 (×2): 40 mg via SUBCUTANEOUS
  Filled 2016-02-11 (×2): qty 0.4

## 2016-02-11 MED ORDER — OMEGA-3-ACID ETHYL ESTERS 1 G PO CAPS
1.0000 g | ORAL_CAPSULE | Freq: Two times a day (BID) | ORAL | Status: DC
  Administered 2016-02-11 – 2016-02-12 (×3): 1 g via ORAL
  Filled 2016-02-11 (×3): qty 1

## 2016-02-11 MED ORDER — ONDANSETRON HCL 4 MG/2ML IJ SOLN: 4.0000 mg | Freq: Four times a day (QID) | INTRAMUSCULAR | Status: DC | PRN

## 2016-02-11 NOTE — Progress Notes (Signed)
Pt arrived on unit. BP 168/96, pt states "he is a lot of pain at the moment " 1mg  of Dilaudid was administered. Will reassessed BP.   Angus Seller

## 2016-02-11 NOTE — H&P (Signed)
[facility] [place] NAME: Chad Mccann    MR#:  [id number]  DATE OF BIRTH:  1966-10-02  DATE OF ADMISSION:  02/10/2016  PRIMARY CARE PHYSICIAN: Everlean Alstrom, MD   REQUESTING/REFERRING PHYSICIAN:   CHIEF COMPLAINT:   Chief Complaint  Patient presents with  . Abdominal Pain    HISTORY OF PRESENT ILLNESS: Chad Mccann  is a 50 y.o. male with a known history of kidney transplant, MGUS, erectile dysfunction sleep apnea hypertension presented to the emergency room with abdominal pain of 2 day duration. Pain is located in the left lower quadrant. Pain is nonradiating. Sharp in nature 10 out of 10 on a scale of 1-10. No history of fever. No nausea or vomiting. No no history of any diarrhea or blood in stool. Patient is compliant with his immunosuppressant medication. He was evaluated in the emergency room WBC count is normal CT abdomen showed diverticulosis but no acute pathology. No history of any recent travel or sick contacts at home.  PAST MEDICAL HISTORY:   Past Medical History  Diagnosis Date  . Other specified congenital anomaly of kidney   . Other testicular hypofunction   . Hypertension   . Sleep apnea   . Hypogonadism in male   . Premature ejaculation   . Monoclonal paraproteinemia   . Obesity, unspecified   . Other specified disorders of bladder   . Benign prostatic hypertrophy   . Bladder spasm   . Kidney 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   . MGUS (monoclonal gammopathy of unknown significance)   . Kidney, malrotation   . Erectile dysfunction   . Ventral hernia   . Frequency     PAST SURGICAL HISTORY: Past Surgical History  Procedure Laterality Date  . Colonoscopy      polyp removal  . Kidney transplant  01/2012  . Spine surgery      cervical fusion  . Dialysis port placement    . Hernia repair  12/29/13    ventral    SOCIAL HISTORY:  Social History  Substance Use Topics  . Smoking status: Never Smoker   .  Smokeless tobacco: Never Used  . Alcohol Use: 0.0 oz/week    0 Standard drinks or equivalent per week     Comment: occ    FAMILY HISTORY:  Family History  Problem Relation Age of Onset  . Colon cancer Father 71  . Heart attack Father   . Stroke Brother   . Kidney disease Neg Hx   . Prostate cancer Neg Hx     DRUG ALLERGIES:  Allergies  Allergen Reactions  . Hydralazine   . Morphine And Related   . Prednisone     hallucinations  . Shellfish Allergy Nausea And Vomiting    REVIEW OF SYSTEMS:   CONSTITUTIONAL: No fever, fatigue or weakness.  EYES: No blurred or double vision.  EARS, NOSE, AND THROAT: No tinnitus or ear pain.  RESPIRATORY: No cough, shortness of breath, wheezing or hemoptysis.  CARDIOVASCULAR: No chest pain, orthopnea, edema.  GASTROINTESTINAL: No nausea, vomiting, diarrhea, has abdominal pain.  GENITOURINARY: No dysuria, hematuria.  ENDOCRINE: No polyuria, nocturia,  HEMATOLOGY: No anemia, easy bruising or bleeding SKIN: No rash or lesion. MUSCULOSKELETAL: No joint pain or arthritis.   NEUROLOGIC: No tingling, numbness, weakness.  PSYCHIATRY: No anxiety or depression.   MEDICATIONS AT HOME:  Prior to Admission medications   Medication Sig Start Date End Date Taking? Authorizing Provider  amLODipine (NORVASC) 5 MG  tablet Take 10 mg by mouth. 02/21/14   Historical Provider, MD  amLODipine-benazepril (LOTREL) 5-10 MG per capsule Take 1 capsule by mouth daily. Reported on 01/11/2016    Historical Provider, MD  aspirin 81 MG tablet Take 81 mg by mouth daily.    Historical Provider, MD  atorvastatin (LIPITOR) 10 MG tablet Take 10 mg by mouth daily.    Historical Provider, MD  CIALIS 20 MG tablet TAKE ONE TABLET ONE HOUR PRIOR TO SEXUALACTIVITY 06/14/15   Larene Beach A McGowan, PA-C  clonazePAM (KLONOPIN) 0.5 MG tablet Take 0.5 mg by mouth 2 (two) times daily as needed for anxiety.    Historical Provider, MD  coal tar-salicylic acid 2 % shampoo Reported on 01/11/2016  05/29/15   Historical Provider, MD  fluorouracil (EFUDEX) 5 % cream Apply to warts in the morning. 05/29/15   Historical Provider, MD  metoprolol (LOPRESSOR) 50 MG tablet Take 50 mg by mouth. 07/20/14   Historical Provider, MD  mycophenolate (CELLCEPT) 500 MG tablet Take 750 mg by mouth 2 (two) times daily.    Historical Provider, MD  omega-3 acid ethyl esters (LOVAZA) 1 G capsule Take 1 g by mouth 2 (two) times daily.    Historical Provider, MD  omeprazole (PRILOSEC) 20 MG capsule Take 20 mg by mouth daily.    Historical Provider, MD  sildenafil (VIAGRA) 100 MG tablet Take 1 tablet (100 mg total) by mouth daily as needed for erectile dysfunction. 01/11/16   Larene Beach A McGowan, PA-C  tacrolimus (PROGRAF) 1 MG capsule Take 4 mg by mouth 2 (two) times daily.    Historical Provider, MD  testosterone cypionate (DEPOTESTOSTERONE CYPIONATE) 200 MG/ML injection Inject 2.5 mLs (500 mg total) into the muscle every 14 (fourteen) days. 12/08/15   Nori Riis, PA-C  zinc sulfate 220 MG capsule Take 220 mg by mouth. Reported on 01/11/2016 05/29/15 05/28/16  Historical Provider, MD      PHYSICAL EXAMINATION:   VITAL SIGNS: Blood pressure 164/89, pulse 79, temperature 97.5 F (36.4 C), temperature source Oral, resp. rate 16, height 5\' 11"  (1.803 m), weight 117.935 kg (260 lb), SpO2 96 %.  GENERAL:  50 y.o.-year-old patient lying in the bed with no acute distress.  EYES: Pupils equal, round, reactive to light and accommodation. No scleral icterus. Extraocular muscles intact.  HEENT: Head atraumatic, normocephalic. Oropharynx and nasopharynx clear.  NECK:  Supple, no jugular venous distention. No thyroid enlargement, no tenderness.  LUNGS: Normal breath sounds bilaterally, no wheezing, rales,rhonchi or crepitation. No use of accessory muscles of respiration.  CARDIOVASCULAR: S1, S2 normal. No murmurs, rubs, or gallops.  ABDOMEN: Soft, tenderness in the left lower quadrant, nondistended. Bowel sounds present. No  organomegaly or mass.  EXTREMITIES: No pedal edema, cyanosis, or clubbing.  NEUROLOGIC: Cranial nerves II through XII are intact. Muscle strength 5/5 in all extremities. Sensation intact. Gait normal. PSYCHIATRIC: The patient is alert and oriented x 3.  SKIN: No obvious rash, lesion, or ulcer.   LABORATORY PANEL:   CBC  Recent Labs Lab 02/10/16 1818  WBC 6.0  HGB 16.0  HCT 47.6  PLT 213  MCV 83.9  MCH 28.2  MCHC 33.6  RDW 16.0*   ------------------------------------------------------------------------------------------------------------------  Chemistries   Recent Labs Lab 02/10/16 1818  NA 138  K 4.0  CL 107  CO2 25  GLUCOSE 95  BUN 14  CREATININE 1.14  CALCIUM 8.9  AST 16  ALT 21  ALKPHOS 61  BILITOT 1.0   ------------------------------------------------------------------------------------------------------------------ estimated creatinine clearance is 101.2  mL/min (by C-G formula based on Cr of 1.14). ------------------------------------------------------------------------------------------------------------------ No results for input(s): TSH, T4TOTAL, T3FREE, THYROIDAB in the last 72 hours.  Invalid input(s): FREET3   Coagulation profile No results for input(s): INR, PROTIME in the last 168 hours. ------------------------------------------------------------------------------------------------------------------- No results for input(s): DDIMER in the last 72 hours. -------------------------------------------------------------------------------------------------------------------  Cardiac Enzymes  Recent Labs Lab 02/10/16 1818  TROPONINI <0.03   ------------------------------------------------------------------------------------------------------------------ Invalid input(s): POCBNP  ---------------------------------------------------------------------------------------------------------------  Urinalysis    Component Value Date/Time    COLORURINE YELLOW* 02/10/2016 1818   APPEARANCEUR CLEAR* 02/10/2016 1818   LABSPEC 1.021 02/10/2016 1818   PHURINE 6.0 02/10/2016 1818   GLUCOSEU 50* 02/10/2016 1818   HGBUR NEGATIVE 02/10/2016 1818   BILIRUBINUR NEGATIVE 02/10/2016 1818   KETONESUR NEGATIVE 02/10/2016 1818   PROTEINUR NEGATIVE 02/10/2016 1818   NITRITE NEGATIVE 02/10/2016 1818   LEUKOCYTESUR NEGATIVE 02/10/2016 1818     RADIOLOGY: Ct Abdomen Pelvis Wo Contrast  02/10/2016  CLINICAL DATA:  50 year old male with left lower quadrant abdominal pain EXAM: CT ABDOMEN AND PELVIS WITHOUT CONTRAST TECHNIQUE: Multidetector CT imaging of the abdomen and pelvis was performed following the standard protocol without IV contrast. COMPARISON:  CT dated 01/08/2011 and ultrasound dated 12/05/2015 FINDINGS: Evaluation of this exam is limited in the absence of intravenous contrast. The visualized lung bases are clear. No intra-abdominal free air or free fluid. The liver, gallbladder, pancreas appear unremarkable. The spleen is mildly enlarged measuring up to 17 cm. The adrenal glands appear unremarkable. There are atrophic native kidneys. A subcentimeter exophytic hypodense lesion from the right renal cortex is not characterized on this CT but appears similar to prior study. A left lower quadrant renal transplant noted. There is no hydronephrosis or nephrolithiasis of the transplant kidney. No peritransplant fluid collection identified. The urinary bladder, prostate and seminal vesicles are grossly unremarkable. There is a focal area of stranding of the fat in the left hemipelvis along the sigmoid colon. There is a focal area fatty density in the center of the inflammatory area. There are scattered sigmoid diverticula. However, the inflammatory change does not appear to involve the sigmoid diverticula and most likely represent an epiploic appendagitis. There is no evidence of bowel obstruction. The appendix is not visualized with certainty. No  inflammatory changes identified in the right lower quadrant. Mild aortoiliac atherosclerotic disease. The abdominal aorta and IVC are otherwise unremarkable on this noncontrast study. No portal venous gas identified. There is no adenopathy. There is mild haziness of the periumbilical subcutaneous fat, possibly related to laparoscopic port placement. Clinical correlation with history of recent surgery recommended. Left anterior pelvic wall surgical scar. There is no fluid collection. Mild degenerative changes of the spine. No acute fracture. IMPRESSION: Focal area of fat stranding in the left hemipelvis abutting the sigmoid colon most compatible with epiploic appendagitis. Diverticulitis is less likely. Unremarkable Left lower quadrant transplant kidney. Electronically Signed   By: Anner Crete M.D.   On: 02/10/2016 22:58    EKG: Orders placed or performed during the hospital encounter of 02/10/16  . ED EKG  . ED EKG  . EKG 12-Lead  . EKG 12-Lead    IMPRESSION AND PLAN: 50 year old male patient with history of kidney transplant, hypertension, sleep apnea, erectile dysfunction presented to the emergency room with abdominal pain. Admitting diagnosis 1. Abdominal pain 2. Diverticulosis 3. Kidney transplant 4. Hypertension 5. Sleep apnea Treatment plan Admit patient to medical floor observation bed IV fluid hydration Pain management with IV Dilaudid Start patient on IV Levaquin antibiotic empirically  Follow-up WBC count Resume immunosuppressant medication. Supportive care  All the records are reviewed and case discussed with ED provider. Management plans discussed with the patient, family and they are in agreement.  CODE STATUS:FULL Code Status History    This patient does not have a recorded code status. Please follow your organizational policy for patients in this situation.       TOTAL TIME TAKING CARE OF THIS PATIENT: 52 minutes.    Saundra Shelling M.D on 02/11/2016 at 12:56  AM  Between 7am to 6pm - Pager - [phone]  After 6pm go to [email or web address] - password [email or web address] [facility]  Office  [phone]  CC: Primary care physician; Everlean Alstrom, MD

## 2016-02-11 NOTE — Progress Notes (Signed)
Pharmacy Antibiotic Note  EKAMJOT BONIFACE is a 50 y.o. male admitted on 02/10/2016 with IAI.  Pharmacy has been consulted for levofloxacin dosing.  Plan: Levofloxacin 750 mg IV Q24H.  Height: 5\' 11"  (180.3 cm) Weight: 260 lb (117.935 kg) IBW/kg (Calculated) : 75.3  Temp (24hrs), Avg:97.5 F (36.4 C), Min:97.5 F (36.4 C), Max:97.5 F (36.4 C)   Recent Labs Lab 02/10/16 1818  WBC 6.0  CREATININE 1.14    Estimated Creatinine Clearance: 101.2 mL/min (by C-G formula based on Cr of 1.14).    Allergies  Allergen Reactions  . Hydralazine   . Morphine And Related   . Prednisone     hallucinations  . Shellfish Allergy Nausea And Vomiting   Thank you for allowing pharmacy to be a part of this patient's care.  Laural Benes, Pharm.D., BCPS Clinical Pharmacist 02/11/2016 1:08 AM

## 2016-02-11 NOTE — Progress Notes (Signed)
A/O.  VSS.  Patient still having quite a bit of abdominal pain, which has been relieved with Dilaudid.  Tolerating his diet well.  Voiding adequately.  No BM today.  IV abx given as scheduled.  Continue to monitor.

## 2016-02-11 NOTE — ED Notes (Signed)
Pt reports his pain is increasing and rates it at 9/10 at this time. Dr Corky Downs informed and new orders received.

## 2016-02-11 NOTE — ED Notes (Signed)
Patient transported to room 208 

## 2016-02-12 MED ORDER — OXYCODONE HCL ER 10 MG PO T12A
10.0000 mg | EXTENDED_RELEASE_TABLET | Freq: Two times a day (BID) | ORAL | Status: DC
  Administered 2016-02-12: 10 mg via ORAL
  Filled 2016-02-12: qty 1

## 2016-02-12 MED ORDER — METRONIDAZOLE 500 MG PO TABS: 500.0000 mg | ORAL_TABLET | Freq: Three times a day (TID) | ORAL | Status: DC

## 2016-02-12 MED ORDER — OXYCODONE-ACETAMINOPHEN 5-325 MG PO TABS: 1.0000 | ORAL_TABLET | ORAL | Status: DC | PRN

## 2016-02-12 MED ORDER — METRONIDAZOLE IN NACL 5-0.79 MG/ML-% IV SOLN
500.0000 mg | Freq: Three times a day (TID) | INTRAVENOUS | Status: DC
  Administered 2016-02-12: 500 mg via INTRAVENOUS
  Filled 2016-02-12 (×4): qty 100

## 2016-02-12 MED ORDER — OXYCODONE-ACETAMINOPHEN 5-325 MG PO TABS
1.0000 | ORAL_TABLET | ORAL | Status: DC | PRN
  Administered 2016-02-12: 1 via ORAL
  Filled 2016-02-12: qty 1

## 2016-02-12 MED ORDER — CIPROFLOXACIN HCL 250 MG PO TABS: 500.0000 mg | ORAL_TABLET | Freq: Two times a day (BID) | ORAL | Status: DC

## 2016-02-12 MED ORDER — OXYCODONE-ACETAMINOPHEN 5-325 MG PO TABS
1.0000 | ORAL_TABLET | Freq: Four times a day (QID) | ORAL | Status: DC | PRN
  Administered 2016-02-12: 1 via ORAL
  Filled 2016-02-12: qty 1

## 2016-02-12 MED ORDER — OXYCODONE HCL ER 10 MG PO T12A: 10.0000 mg | EXTENDED_RELEASE_TABLET | Freq: Two times a day (BID) | ORAL | Status: DC

## 2016-02-12 MED ORDER — CIPROFLOXACIN HCL 500 MG PO TABS: 500.0000 mg | ORAL_TABLET | Freq: Two times a day (BID) | ORAL | Status: DC

## 2016-02-12 NOTE — Progress Notes (Signed)
Work note given

## 2016-02-12 NOTE — Progress Notes (Signed)
[facility] [facility] [place] NAME: Nolyn Gettman    MR#:  [id number]  DATE OF BIRTH:  [birth year]  SUBJECTIVE:  CHIEF COMPLAINT:   Chief Complaint  Patient presents with  . Abdominal Pain     Came with abdominal pain progressively getting worse for last 2-3 days. No nausea, vomiting, diarrhea.  Found to have epiploic appendagitis, tolerating diet.  REVIEW OF SYSTEMS:  CONSTITUTIONAL: No fever, fatigue or weakness.  EYES: No blurred or double vision.  EARS, NOSE, AND THROAT: No tinnitus or ear pain.  RESPIRATORY: No cough, shortness of breath, wheezing or hemoptysis.  CARDIOVASCULAR: No chest pain, orthopnea, edema.  GASTROINTESTINAL: No nausea, vomiting, diarrhea, have abdominal pain.  GENITOURINARY: No dysuria, hematuria.  ENDOCRINE: No polyuria, nocturia,  HEMATOLOGY: No anemia, easy bruising or bleeding SKIN: No rash or lesion. MUSCULOSKELETAL: No joint pain or arthritis.   NEUROLOGIC: No tingling, numbness, weakness.  PSYCHIATRY: No anxiety or depression.   ROS  DRUG ALLERGIES:   Allergies  Allergen Reactions  . Hydralazine   . Morphine And Related   . Prednisone     hallucinations  . Shellfish Allergy Nausea And Vomiting    VITALS:  Blood pressure 131/75, pulse 81, temperature 97.6 F (36.4 C), temperature source Oral, resp. rate 20, height 5\' 11"  (1.803 m), weight 117.935 kg (260 lb), SpO2 97 %.  PHYSICAL EXAMINATION:  GENERAL:  50 y.o.-year-old patient lying in the bed with no acute distress.  EYES: Pupils equal, round, reactive to light and accommodation. No scleral icterus. Extraocular muscles intact.  HEENT: Head atraumatic, normocephalic. Oropharynx and nasopharynx clear.  NECK:  Supple, no jugular venous distention. No thyroid enlargement, no tenderness.  LUNGS: Normal breath sounds bilaterally, no wheezing, rales,rhonchi or crepitation. No use of accessory muscles of respiration.  CARDIOVASCULAR: S1, S2  normal. No murmurs, rubs, or gallops.  ABDOMEN: Soft, left lower and paraumbilical tender, nondistended. Bowel sounds present. No organomegaly or mass.  EXTREMITIES: No pedal edema, cyanosis, or clubbing.  NEUROLOGIC: Cranial nerves II through XII are intact. Muscle strength 5/5 in all extremities. Sensation intact. Gait not checked.  PSYCHIATRIC: The patient is alert and oriented x 3.  SKIN: No obvious rash, lesion, or ulcer.   Physical Exam LABORATORY PANEL:   CBC  Recent Labs Lab 02/11/16 0506  WBC 4.2  HGB 14.4  HCT 42.9  PLT 184   ------------------------------------------------------------------------------------------------------------------  Chemistries   Recent Labs Lab 02/10/16 1818 02/11/16 0506  NA 138 140  K 4.0 3.8  CL 107 107  CO2 25 28  GLUCOSE 95 92  BUN 14 11  CREATININE 1.14 0.96  CALCIUM 8.9 8.4*  AST 16  --   ALT 21  --   ALKPHOS 61  --   BILITOT 1.0  --    ------------------------------------------------------------------------------------------------------------------  Cardiac Enzymes  Recent Labs Lab 02/10/16 1818  TROPONINI <0.03   ------------------------------------------------------------------------------------------------------------------  RADIOLOGY:  Ct Abdomen Pelvis Wo Contrast  02/10/2016  CLINICAL DATA:  50 year old male with left lower quadrant abdominal pain EXAM: CT ABDOMEN AND PELVIS WITHOUT CONTRAST TECHNIQUE: Multidetector CT imaging of the abdomen and pelvis was performed following the standard protocol without IV contrast. COMPARISON:  CT dated 01/08/2011 and ultrasound dated 12/05/2015 FINDINGS: Evaluation of this exam is limited in the absence of intravenous contrast. The visualized lung bases are clear. No intra-abdominal free air or free fluid. The liver, gallbladder, pancreas appear unremarkable. The spleen is mildly enlarged measuring up to 17 cm. The adrenal glands  appear unremarkable. There are atrophic native  kidneys. A subcentimeter exophytic hypodense lesion from the right renal cortex is not characterized on this CT but appears similar to prior study. A left lower quadrant renal transplant noted. There is no hydronephrosis or nephrolithiasis of the transplant kidney. No peritransplant fluid collection identified. The urinary bladder, prostate and seminal vesicles are grossly unremarkable. There is a focal area of stranding of the fat in the left hemipelvis along the sigmoid colon. There is a focal area fatty density in the center of the inflammatory area. There are scattered sigmoid diverticula. However, the inflammatory change does not appear to involve the sigmoid diverticula and most likely represent an epiploic appendagitis. There is no evidence of bowel obstruction. The appendix is not visualized with certainty. No inflammatory changes identified in the right lower quadrant. Mild aortoiliac atherosclerotic disease. The abdominal aorta and IVC are otherwise unremarkable on this noncontrast study. No portal venous gas identified. There is no adenopathy. There is mild haziness of the periumbilical subcutaneous fat, possibly related to laparoscopic port placement. Clinical correlation with history of recent surgery recommended. Left anterior pelvic wall surgical scar. There is no fluid collection. Mild degenerative changes of the spine. No acute fracture. IMPRESSION: Focal area of fat stranding in the left hemipelvis abutting the sigmoid colon most compatible with epiploic appendagitis. Diverticulitis is less likely. Unremarkable Left lower quadrant transplant kidney. Electronically Signed   By: Anner Crete M.D.   On: 02/10/2016 22:58    ASSESSMENT AND PLAN:   Principal Problem:   Abdominal pain  * epiploic appendagitis   ON IV levaquin, add flagyl.   Pain and nausea management as needed.  * Status post kidney transplant    Kidney function is stable, continue cellcept and prograf.  *  Hypertension  Blood pressure stable, continue amlodipine.  * Hyperlipidemia  Continue statin.     All the records are reviewed and case discussed with Care Management/Social Workerr. Management plans discussed with the patient, family and they are in agreement.  CODE STATUS: Full.  TOTAL TIME TAKING CARE OF THIS PATIENT: 35 minutes.     POSSIBLE D/C IN 1-2 DAYS, DEPENDING ON CLINICAL CONDITION.   Vaughan Basta M.D on 02/12/2016   Between 7am to 6pm - Pager - [phone]  After 6pm go to [email or web address] - password [email or web address] [facility]  Office  [phone]  CC: Primary care physician; Everlean Alstrom, MD  Note: This dictation was prepared with Dragon dictation along with smaller phrase technology. Any transcriptional errors that result from this process are unintentional.

## 2016-02-12 NOTE — Progress Notes (Signed)
[place], [place].   02/12/2016  Patient: Chad Mccann   Date of Birth:  Nov 04, 1966  Date of admission:  02/10/2016  Date of Discharge  02/12/2016    To Whom it May Concern:   Chad Mccann  may return to work on 02/18/16.  PHYSICAL ACTIVITY:  Full  If you have any questions or concerns, please don't hesitate to call.  Sincerely,   Vaughan Basta M.D Pager [phone] Office : [phone]   .

## 2016-02-12 NOTE — Discharge Summary (Signed)
[facility] [place] NAME: Chad Mccann    MR#:  [id number]  DATE OF BIRTH:  11-28-1966  DATE OF ADMISSION:  02/10/2016 ADMITTING PHYSICIAN: Saundra Shelling, MD  DATE OF DISCHARGE: 02/12/2016  PRIMARY CARE PHYSICIAN: Everlean Alstrom, MD    ADMISSION DIAGNOSIS:  Diverticulitis of large intestine without perforation or abscess without bleeding [K57.32]  DISCHARGE DIAGNOSIS:  Principal Problem:   Abdominal pain   SECONDARY DIAGNOSIS:   Past Medical History  Diagnosis Date  . Other specified congenital anomaly of kidney   . Other testicular hypofunction   . Hypertension   . Sleep apnea   . Hypogonadism in male   . Premature ejaculation   . Monoclonal paraproteinemia   . Obesity, unspecified   . Other specified disorders of bladder   . Benign prostatic hypertrophy   . Bladder spasm   . Kidney 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   . MGUS (monoclonal gammopathy of unknown significance)   . Kidney, malrotation   . Erectile dysfunction   . Ventral hernia   . Frequency     HOSPITAL COURSE:   * epiploic appendagitis  ON IV levaquin, add flagyl.  Pain and nausea management as needed.   Change to oral cipro and flagyl.  * Status post kidney transplant  Kidney function is stable, continue cellcept and prograf.  * Hypertension Blood pressure stable, continue amlodipine.  * Hyperlipidemia Continue statin.  DISCHARGE CONDITIONS:   Stable.  CONSULTS OBTAINED:     DRUG ALLERGIES:   Allergies  Allergen Reactions  . Hydralazine   . Morphine And Related   . Prednisone     hallucinations  . Shellfish Allergy Nausea And Vomiting    DISCHARGE MEDICATIONS:   Current Discharge Medication List    START taking these medications   Details  ciprofloxacin (CIPRO) 500 MG tablet Take 1 tablet (500 mg total) by mouth 2 (two) times daily. Qty: 16 tablet, Refills: 0    metroNIDAZOLE (FLAGYL) 500 MG tablet Take 1 tablet  (500 mg total) by mouth every 8 (eight) hours. Qty: 24 tablet, Refills: 0    oxyCODONE (OXYCONTIN) 10 mg 12 hr tablet Take 1 tablet (10 mg total) by mouth every 12 (twelve) hours. Qty: 16 tablet, Refills: 0    oxyCODONE-acetaminophen (PERCOCET/ROXICET) 5-325 MG tablet Take 1-2 tablets by mouth every 4 (four) hours as needed for moderate pain or severe pain. Qty: 40 tablet, Refills: 0      CONTINUE these medications which have NOT CHANGED   Details  amLODipine (NORVASC) 5 MG tablet Take 10 mg by mouth.    aspirin 81 MG tablet Take 81 mg by mouth daily.    atorvastatin (LIPITOR) 10 MG tablet Take 10 mg by mouth daily.    CIALIS 20 MG tablet TAKE ONE TABLET ONE HOUR PRIOR TO SEXUALACTIVITY Qty: 6 tablet, Refills: 12    clonazePAM (KLONOPIN) 0.5 MG tablet Take 0.5 mg by mouth 2 (two) times daily as needed for anxiety.    coal tar-salicylic acid 2 % shampoo Reported on 01/11/2016    fluorouracil (EFUDEX) 5 % cream Apply to warts in the morning.    metoprolol (LOPRESSOR) 50 MG tablet Take 50 mg by mouth.    mycophenolate (CELLCEPT) 500 MG tablet Take 750 mg by mouth 2 (two) times daily.    omega-3 acid ethyl esters (LOVAZA) 1 G capsule Take 1 g by mouth 2 (two) times daily.    omeprazole (PRILOSEC) 20 MG capsule Take  20 mg by mouth daily.    sildenafil (VIAGRA) 100 MG tablet Take 1 tablet (100 mg total) by mouth daily as needed for erectile dysfunction. Qty: 6 tablet, Refills: 12    tacrolimus (PROGRAF) 1 MG capsule Take 4 mg by mouth 2 (two) times daily.    testosterone cypionate (DEPOTESTOSTERONE CYPIONATE) 200 MG/ML injection Inject 2.5 mLs (500 mg total) into the muscle every 14 (fourteen) days. Qty: 10 mL, Refills: 0   Associated Diagnoses: Hypogonadism in male    zinc sulfate 220 MG capsule Take 220 mg by mouth. Reported on 01/11/2016      STOP taking these medications     amLODipine-benazepril (LOTREL) 5-10 MG per capsule          DISCHARGE INSTRUCTIONS:     Follow with PMD in 2 weeks.  If you experience worsening of your admission symptoms, develop shortness of breath, life threatening emergency, suicidal or homicidal thoughts you must seek medical attention immediately by calling 911 or calling your MD immediately  if symptoms less severe.  You Must read complete instructions/literature along with all the possible adverse reactions/side effects for all the Medicines you take and that have been prescribed to you. Take any new Medicines after you have completely understood and accept all the possible adverse reactions/side effects.   Please note  You were cared for by a hospitalist during your hospital stay. If you have any questions about your discharge medications or the care you received while you were in the hospital after you are discharged, you can call the unit and asked to speak with the hospitalist on call if the hospitalist that took care of you is not available. Once you are discharged, your primary care physician will handle any further medical issues. Please note that NO REFILLS for any discharge medications will be authorized once you are discharged, as it is imperative that you return to your primary care physician (or establish a relationship with a primary care physician if you do not have one) for your aftercare needs so that they can reassess your need for medications and monitor your lab values.    Today   CHIEF COMPLAINT:   Chief Complaint  Patient presents with  . Abdominal Pain    HISTORY OF PRESENT ILLNESS:  Chad Mccann  is a 50 y.o. male with a known history of kidney transplant, MGUS, erectile dysfunction sleep apnea hypertension presented to the emergency room with abdominal pain of 2 day duration. Pain is located in the left lower quadrant. Pain is nonradiating. Sharp in nature 10 out of 10 on a scale of 1-10. No history of fever. No nausea or vomiting. No no history of any diarrhea or blood in stool. Patient is  compliant with his immunosuppressant medication. He was evaluated in the emergency room WBC count is normal CT abdomen showed diverticulosis but no acute pathology. No history of any recent travel or sick contacts at home.   VITAL SIGNS:  Blood pressure 155/88, pulse 81, temperature 98.1 F (36.7 C), temperature source Oral, resp. rate 18, height 5\' 11"  (1.803 m), weight 117.935 kg (260 lb), SpO2 97 %.  I/O:   Intake/Output Summary (Last 24 hours) at 02/12/16 1501 Last data filed at 02/12/16 1300  Gross per 24 hour  Intake 3522.23 ml  Output   2050 ml  Net 1472.23 ml    PHYSICAL EXAMINATION:   GENERAL: 50 y.o.-year-old patient lying in the bed with no acute distress.  EYES: Pupils equal, round, reactive to  light and accommodation. No scleral icterus. Extraocular muscles intact.  HEENT: Head atraumatic, normocephalic. Oropharynx and nasopharynx clear.  NECK: Supple, no jugular venous distention. No thyroid enlargement, no tenderness.  LUNGS: Normal breath sounds bilaterally, no wheezing, rales,rhonchi or crepitation. No use of accessory muscles of respiration.  CARDIOVASCULAR: S1, S2 normal. No murmurs, rubs, or gallops.  ABDOMEN: Soft, left lower and paraumbilical tender, nondistended. Bowel sounds present. No organomegaly or mass.  EXTREMITIES: No pedal edema, cyanosis, or clubbing.  NEUROLOGIC: Cranial nerves II through XII are intact. Muscle strength 5/5 in all extremities. Sensation intact. Gait not checked.  PSYCHIATRIC: The patient is alert and oriented x 3.  SKIN: No obvious rash, lesion, or ulcer.   DATA REVIEW:   CBC  Recent Labs Lab 02/11/16 0506  WBC 4.2  HGB 14.4  HCT 42.9  PLT 184    Chemistries   Recent Labs Lab 02/10/16 1818 02/11/16 0506  NA 138 140  K 4.0 3.8  CL 107 107  CO2 25 28  GLUCOSE 95 92  BUN 14 11  CREATININE 1.14 0.96  CALCIUM 8.9 8.4*  AST 16  --   ALT 21  --   ALKPHOS 61  --   BILITOT 1.0  --     Cardiac  Enzymes  Recent Labs Lab 02/10/16 1818  TROPONINI <0.03    Microbiology Results  Results for orders placed or performed in visit on 01/23/12  Body fluid culture     Status: None   Collection Time: 01/23/12 11:30 AM  Result Value Ref Range Status   Micro Text Report   Final       SOURCE: PERITONEAL    COMMENT                   NO GROWTH AEROBICALLY/ANAEROBICALLY IN 4 DAYS   GRAM STAIN                FEW WHITE BLOOD CELLS   GRAM STAIN                NO ORGANISMS SEEN   ANTIBIOTIC                                                        RADIOLOGY:  Ct Abdomen Pelvis Wo Contrast  02/10/2016  CLINICAL DATA:  50 year old male with left lower quadrant abdominal pain EXAM: CT ABDOMEN AND PELVIS WITHOUT CONTRAST TECHNIQUE: Multidetector CT imaging of the abdomen and pelvis was performed following the standard protocol without IV contrast. COMPARISON:  CT dated 01/08/2011 and ultrasound dated 12/05/2015 FINDINGS: Evaluation of this exam is limited in the absence of intravenous contrast. The visualized lung bases are clear. No intra-abdominal free air or free fluid. The liver, gallbladder, pancreas appear unremarkable. The spleen is mildly enlarged measuring up to 17 cm. The adrenal glands appear unremarkable. There are atrophic native kidneys. A subcentimeter exophytic hypodense lesion from the right renal cortex is not characterized on this CT but appears similar to prior study. A left lower quadrant renal transplant noted. There is no hydronephrosis or nephrolithiasis of the transplant kidney. No peritransplant fluid collection identified. The urinary bladder, prostate and seminal vesicles are grossly unremarkable. There is a focal area of stranding of the fat in the left hemipelvis along the sigmoid colon. There is a focal area fatty  density in the center of the inflammatory area. There are scattered sigmoid diverticula. However, the inflammatory change does not appear to involve the sigmoid  diverticula and most likely represent an epiploic appendagitis. There is no evidence of bowel obstruction. The appendix is not visualized with certainty. No inflammatory changes identified in the right lower quadrant. Mild aortoiliac atherosclerotic disease. The abdominal aorta and IVC are otherwise unremarkable on this noncontrast study. No portal venous gas identified. There is no adenopathy. There is mild haziness of the periumbilical subcutaneous fat, possibly related to laparoscopic port placement. Clinical correlation with history of recent surgery recommended. Left anterior pelvic wall surgical scar. There is no fluid collection. Mild degenerative changes of the spine. No acute fracture. IMPRESSION: Focal area of fat stranding in the left hemipelvis abutting the sigmoid colon most compatible with epiploic appendagitis. Diverticulitis is less likely. Unremarkable Left lower quadrant transplant kidney. Electronically Signed   By: Anner Crete M.D.   On: 02/10/2016 22:58    EKG:   Orders placed or performed during the hospital encounter of 02/10/16  . ED EKG  . ED EKG  . EKG 12-Lead  . EKG 12-Lead      Management plans discussed with the patient, family and they are in agreement.  CODE STATUS:     Code Status Orders        Start     Ordered   02/11/16 0207  Full code   Continuous     02/11/16 0207    Code Status History    Date Active Date Inactive Code Status Order ID Comments User Context   This patient has a current code status but no historical code status.      TOTAL TIME TAKING CARE OF THIS PATIENT: 35 minutes.    Vaughan Basta M.D on 02/12/2016 at 3:01 PM  Between 7am to 6pm - Pager - [phone]  After 6pm go to [email or web address] - password [email or web address] [facility]  Office  [phone]  CC: Primary care physician; Everlean Alstrom, MD   Note: This dictation was prepared with Dragon dictation along with smaller phrase technology.  Any transcriptional errors that result from this process are unintentional.

## 2016-02-12 NOTE — Progress Notes (Signed)
Pt stable. IV removed. D/c instructions given and education provided. Signed prescriptions verified and given. Pt states he understands instructions. Pt dressed and escorted out by staff. Driven home by family.  

## 2016-02-22 ENCOUNTER — Ambulatory Visit (INDEPENDENT_AMBULATORY_CARE_PROVIDER_SITE_OTHER): Payer: BLUE CROSS/BLUE SHIELD

## 2016-02-22 DIAGNOSIS — E291 Testicular hypofunction: Secondary | ICD-10-CM

## 2016-02-22 MED ORDER — TESTOSTERONE CYPIONATE 200 MG/ML IM SOLN
200.0000 mg | Freq: Once | INTRAMUSCULAR | Status: AC
  Administered 2016-02-22: 200 mg via INTRAMUSCULAR

## 2016-02-22 NOTE — Progress Notes (Signed)
Testosterone IM Injection  Due to Hypogonadism patient is present today for a Testosterone Injection.  Medication: Testosterone Cypionate Dose: 9mL Location: left upper outer buttocks Lot: [id number] Exp:06/2017  Patient tolerated well, no complications were noted  Preformed by: Toniann Fail, LPN

## 2016-03-14 ENCOUNTER — Ambulatory Visit (INDEPENDENT_AMBULATORY_CARE_PROVIDER_SITE_OTHER): Payer: BLUE CROSS/BLUE SHIELD

## 2016-03-14 DIAGNOSIS — E291 Testicular hypofunction: Secondary | ICD-10-CM | POA: Diagnosis not present

## 2016-03-14 MED ORDER — TESTOSTERONE CYPIONATE 200 MG/ML IM SOLN
200.0000 mg | Freq: Once | INTRAMUSCULAR | Status: AC
  Administered 2016-03-14: 200 mg via INTRAMUSCULAR

## 2016-03-14 NOTE — Progress Notes (Signed)
Testosterone IM Injection  Due to Hypogonadism patient is present today for a Testosterone Injection.  Medication: Testosterone Cypionate Dose: 74mL Location: left upper outer buttocks Lot: [id number] Exp:06/2017  Patient tolerated well, no complications were noted  Preformed by: Toniann Fail, LPN

## 2016-04-03 ENCOUNTER — Ambulatory Visit (INDEPENDENT_AMBULATORY_CARE_PROVIDER_SITE_OTHER): Payer: BLUE CROSS/BLUE SHIELD

## 2016-04-03 DIAGNOSIS — E291 Testicular hypofunction: Secondary | ICD-10-CM | POA: Diagnosis not present

## 2016-04-03 MED ORDER — TESTOSTERONE CYPIONATE 200 MG/ML IM SOLN
200.0000 mg | Freq: Once | INTRAMUSCULAR | Status: AC
  Administered 2016-04-03: 200 mg via INTRAMUSCULAR

## 2016-04-03 NOTE — Progress Notes (Addendum)
Testosterone IM Injection  Due to Hypogonadism patient is present today for a Testosterone Injection.  Medication: Testosterone Cypionate Dose: 22mL Location: right upper outer buttocks Lot: [id number]  Exp:06/2017  Patient tolerated well, no complications were noted  Preformed by: K.Bita Cartwright,CMA  Follow up: 3 weeks Testosterone injection. Return in about 3 months (around 04/10/2016) for HCT and testosterone blood drawn. The pt was also informed he needs to a refill before he comes back for his next injection.

## 2016-04-03 NOTE — Addendum Note (Signed)
Addended by: Wilson Singer on: 04/03/2016 04:47 PM   Modules accepted: Orders

## 2016-04-04 ENCOUNTER — Ambulatory Visit: Payer: BLUE CROSS/BLUE SHIELD

## 2016-04-11 ENCOUNTER — Encounter: Payer: Self-pay | Admitting: Urology

## 2016-04-11 ENCOUNTER — Other Ambulatory Visit: Payer: BLUE CROSS/BLUE SHIELD

## 2016-04-25 ENCOUNTER — Ambulatory Visit: Payer: BLUE CROSS/BLUE SHIELD

## 2016-05-01 ENCOUNTER — Telehealth: Payer: Self-pay | Admitting: Urology

## 2016-05-01 DIAGNOSIS — E291 Testicular hypofunction: Secondary | ICD-10-CM

## 2016-05-01 NOTE — Telephone Encounter (Signed)
The is fine.  I will need to see him in June.

## 2016-05-01 NOTE — Telephone Encounter (Signed)
Mr. Keilty called asking if his depo medication can be called into his pharmacy. Please contact him if this can't be done.  Pt's ph# [phone]  Thank you.

## 2016-05-02 MED ORDER — TESTOSTERONE CYPIONATE 200 MG/ML IM SOLN: 500.0000 mg | INTRAMUSCULAR | Status: DC

## 2016-05-02 NOTE — Telephone Encounter (Signed)
Medication was faxed to pharmacy for pt.

## 2016-05-03 ENCOUNTER — Ambulatory Visit (INDEPENDENT_AMBULATORY_CARE_PROVIDER_SITE_OTHER): Payer: BLUE CROSS/BLUE SHIELD

## 2016-05-03 DIAGNOSIS — E291 Testicular hypofunction: Secondary | ICD-10-CM

## 2016-05-03 MED ORDER — TESTOSTERONE CYPIONATE 200 MG/ML IM SOLN: 200.0000 mg | Freq: Once | INTRAMUSCULAR | Status: DC

## 2016-05-03 MED ORDER — TESTOSTERONE CYPIONATE 200 MG/ML IM SOLN
200.0000 mg | Freq: Once | INTRAMUSCULAR | Status: AC
  Administered 2016-05-03: 200 mg via INTRAMUSCULAR

## 2016-05-03 NOTE — Progress Notes (Signed)
Testosterone IM Injection  Due to Hypogonadism patient is present today for a Testosterone Injection.  Medication: Testosterone Cypionate Dose: 26mL Location: left upper outer buttocks Lot: [id number] Exp:09/2017  Patient tolerated well, no complications were noted  Preformed by: [place]   Follow up: 3 weeks, blood work was drawn today.

## 2016-05-04 LAB — TESTOSTERONE: Testosterone: 100 ng/dL — ABNORMAL LOW (ref 348–1197)

## 2016-05-04 LAB — HEMATOCRIT: Hematocrit: 48.6 % (ref 37.5–51.0)

## 2016-05-06 ENCOUNTER — Telehealth: Payer: Self-pay

## 2016-05-06 NOTE — Telephone Encounter (Signed)
LMOM

## 2016-05-06 NOTE — Telephone Encounter (Signed)
-----   Message from Nori Riis, PA-C sent at 05/04/2016  5:41 PM EDT ----- Patient's hematocrit is stable, but his serum testosterone is low.  The blood work was drawn at the time of the injection, so this is most likely his trough.  I will need to see the patient in June for a 6 month follow-up.  It would twice a day until to schedule his appointment 1 week after receiving an injection so that we could do the blood work during his peak.

## 2016-05-07 NOTE — Telephone Encounter (Signed)
Spoke with pt in reference to lab results. Made pt aware labs need to be drawn again before an injection. Does this need to be a before 9 lab draw?  Pt was transferred to the front to make f/u appt.

## 2016-05-07 NOTE — Telephone Encounter (Signed)
LMOM-made pt aware labs do not need to be before 9.

## 2016-05-07 NOTE — Telephone Encounter (Signed)
No.  It does not need to be a morning draw.

## 2016-05-08 ENCOUNTER — Ambulatory Visit: Payer: BLUE CROSS/BLUE SHIELD

## 2016-05-23 ENCOUNTER — Encounter: Payer: Self-pay | Admitting: Urology

## 2016-05-23 ENCOUNTER — Ambulatory Visit (INDEPENDENT_AMBULATORY_CARE_PROVIDER_SITE_OTHER): Payer: BLUE CROSS/BLUE SHIELD | Admitting: Urology

## 2016-05-23 VITALS — BP 131/78 | HR 67 | Ht 69.0 in | Wt 268.1 lb

## 2016-05-23 DIAGNOSIS — N401 Enlarged prostate with lower urinary tract symptoms: Secondary | ICD-10-CM

## 2016-05-23 DIAGNOSIS — E291 Testicular hypofunction: Secondary | ICD-10-CM | POA: Diagnosis not present

## 2016-05-23 DIAGNOSIS — Z940 Kidney transplant status: Secondary | ICD-10-CM

## 2016-05-23 DIAGNOSIS — N528 Other male erectile dysfunction: Secondary | ICD-10-CM

## 2016-05-23 DIAGNOSIS — N400 Enlarged prostate without lower urinary tract symptoms: Secondary | ICD-10-CM | POA: Diagnosis not present

## 2016-05-23 DIAGNOSIS — N529 Male erectile dysfunction, unspecified: Secondary | ICD-10-CM

## 2016-05-23 DIAGNOSIS — N138 Other obstructive and reflux uropathy: Secondary | ICD-10-CM

## 2016-05-23 MED ORDER — TESTOSTERONE CYPIONATE 200 MG/ML IM SOLN
200.0000 mg | Freq: Once | INTRAMUSCULAR | Status: AC
  Administered 2016-05-23: 200 mg via INTRAMUSCULAR

## 2016-05-23 NOTE — Progress Notes (Signed)
4:19 PM   Chad Mccann Jun 23, 1966 [id number]  Referring provider: Shepard General, MD [facility] [address] [place], [place] [address]  Chief Complaint  Patient presents with  . Hypogonadism    follow up    HPI: Patient is a 50 year old Caucasian male with a history of a renal transplant, erectile dysfunction, hypogonadism and BPH with LUTS who presents today for his 6 months recheck.  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  His recent serum creatinine was 1.14 on 03/13/2016. Renal ultrasound performed on 12/05/2015 noted native kidneys atrophic with cortical thinning an increased echotexture. No hydronephrosis. No hydronephrosis within the transplanted kidney. He is not experiencing back pain at this time.    Erectile dysfunction His SHIM score is 16, which is moderate erectile dysfunction.   He has been having difficulty with erections for the last several years.   His major complaint is maintaining an erections.  His libido is preserved.   His risk factors for ED are hyperlipidemia, HTN, hypogonadism and age .  He denies any painful erections or curvatures with his erections.   He has tried PDE5-inhibitors in the past with good results.        SHIM      05/23/16 1605       SHIM: Over the last 6 months:   How do you rate your confidence that you could get and keep an erection? Moderate     When you had erections with sexual stimulation, how often were your erections hard enough for penetration (entering your partner)? Sometimes (about half the time)     During sexual intercourse, how often were you able to maintain your erection after you had penetrated (entered) your partner? Difficult     During sexual intercourse, how difficult was it to maintain your erection to completion of intercourse? Slightly Difficult     When you  attempted sexual intercourse, how often was it satisfactory for you? Difficult     SHIM Total Score   SHIM 16        Score: 1-7 Severe ED 8-11 Moderate ED 12-16 Mild-Moderate ED 17-21 Mild ED 22-25 No ED  Hypogonadism Patient is experiencing a decrease in libido, a lack of energy, a decrease in strength, and erections being less strong.  This is indicated by his responses to the ADAM questionnaire.  His pretreatment testosterone level was 116 ng/dL on 05/14/2012.  He is currently managing his hypogonadism with testosterone cypionate 200 mg IM every three weeks.  He is not having spontaneous erections.  He does sleep with a CPAP machine.          Androgen Deficiency in the Aging Male      05/23/16 1600       Androgen Deficiency in the Aging Male   Do you have a decrease in libido (sex drive) Yes     Do you have lack of energy Yes     Do you have a decrease in strength and/or endurance Yes     Have you lost height No     Have you noticed a decreased "enjoyment of life" No     Are you sad and/or grumpy No     Are your erections less strong Yes     Have you noticed a recent deterioration in your ability to play sports No  Are you falling asleep after dinner No     Has there been a recent deterioration in your work performance No        BPH WITH LUTS His IPSS score today is 8, which is moderate lower urinary tract symptomatology. He is pleased with his quality life due to his urinary symptoms.   He denies any dysuria, hematuria or suprapubic pain.  He also denies any recent fevers, chills, nausea or vomiting.  He does not have a family history of PCa.      IPSS      05/23/16 1500       International Prostate Symptom Score   How often have you had the sensation of not emptying your bladder? Less than 1 in 5     How often have you had to urinate less than every two hours? Less than 1 in 5 times     How often have you found you stopped and started again several times when  you urinated? Less than 1 in 5 times     How often have you found it difficult to postpone urination? About half the time     How often have you had a weak urinary stream? Less than 1 in 5 times     How often have you had to strain to start urination? Not at All     How many times did you typically get up at night to urinate? 1 Time     Total IPSS Score 8     Quality of Life due to urinary symptoms   If you were to spend the rest of your life with your urinary condition just the way it is now how would you feel about that? Mostly Satisfied        Score:  1-7 Mild 8-19 Moderate 20-35 Severe   PMH: Past Medical History  Diagnosis Date  . Other specified congenital anomaly of kidney   . Other testicular hypofunction   . Hypertension   . Sleep apnea   . Hypogonadism in male   . Premature ejaculation   . Monoclonal paraproteinemia   . Obesity, unspecified   . Other specified disorders of bladder   . Benign prostatic hypertrophy   . Bladder spasm   . Kidney 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   . MGUS (monoclonal gammopathy of unknown significance)   . Kidney, malrotation   . Erectile dysfunction   . Ventral hernia   . Frequency     Surgical History: Past Surgical History  Procedure Laterality Date  . Colonoscopy      polyp removal  . Kidney transplant  01/2012  . Spine surgery      cervical fusion  . Dialysis port placement    . Hernia repair  12/29/13    ventral    Home Medications:    Medication List       This list is accurate as of: 05/23/16  4:19 PM.  Always use your most recent med list.               amLODipine 5 MG tablet  Commonly known as:  NORVASC  Take 10 mg by mouth.     aspirin 81 MG tablet  Take 81 mg by mouth daily.     atorvastatin 10 MG tablet  Commonly known as:  LIPITOR  Take 10 mg by mouth daily.     CIALIS 20 MG tablet  Generic drug:  tadalafil  TAKE ONE TABLET ONE HOUR PRIOR TO SEXUALACTIVITY  ciprofloxacin 500 MG tablet  Commonly  known as:  CIPRO  Take 1 tablet (500 mg total) by mouth 2 (two) times daily.     clonazePAM 0.5 MG tablet  Commonly known as:  KLONOPIN  Take 0.5 mg by mouth 2 (two) times daily as needed for anxiety.     coal tar-salicylic acid 2 % shampoo  Reported on 05/23/2016     fluorouracil 5 % cream  Commonly known as:  EFUDEX  Reported on 05/23/2016     metoprolol 50 MG tablet  Commonly known as:  LOPRESSOR  Take 50 mg by mouth.     metroNIDAZOLE 500 MG tablet  Commonly known as:  FLAGYL  Take 1 tablet (500 mg total) by mouth every 8 (eight) hours.     mycophenolate 500 MG tablet  Commonly known as:  CELLCEPT  Take 750 mg by mouth 2 (two) times daily.     omega-3 acid ethyl esters 1 g capsule  Commonly known as:  LOVAZA  Take 1 g by mouth 2 (two) times daily.     omeprazole 20 MG capsule  Commonly known as:  PRILOSEC  Take 20 mg by mouth daily. Reported on 05/23/2016     oxyCODONE 10 mg 12 hr tablet  Commonly known as:  OXYCONTIN  Take 1 tablet (10 mg total) by mouth every 12 (twelve) hours.     oxyCODONE-acetaminophen 5-325 MG tablet  Commonly known as:  PERCOCET/ROXICET  Take 1-2 tablets by mouth every 4 (four) hours as needed for moderate pain or severe pain.     ranitidine 150 MG tablet  Commonly known as:  ZANTAC  Take 150 mg by mouth.     sildenafil 100 MG tablet  Commonly known as:  VIAGRA  Take 1 tablet (100 mg total) by mouth daily as needed for erectile dysfunction.     tacrolimus 1 MG capsule  Commonly known as:  PROGRAF  Take 4 mg by mouth 2 (two) times daily.     testosterone cypionate 200 MG/ML injection  Commonly known as:  DEPOTESTOSTERONE CYPIONATE  Inject 2.5 mLs (500 mg total) into the muscle every 14 (fourteen) days.     zinc sulfate 220 (50 Zn) MG capsule  Take 220 mg by mouth. Reported on 05/23/2016        Allergies:  Allergies  Allergen Reactions  . Hydralazine   . Morphine And Related   . Prednisone     hallucinations  . Shellfish Allergy  Nausea And Vomiting    Family History: Family History  Problem Relation Age of Onset  . Colon cancer Father 68  . Heart attack Father   . Stroke Brother   . Kidney disease Neg Hx   . Prostate cancer Neg Hx     Social History:  reports that he has never smoked. He has never used smokeless tobacco. He reports that he drinks alcohol. He reports that he does not use illicit drugs.  ROS: UROLOGY Frequent Urination?: No Hard to postpone urination?: No Burning/pain with urination?: No Get up at night to urinate?: No Leakage of urine?: No Urine stream starts and stops?: No Trouble starting stream?: No Do you have to strain to urinate?: No Blood in urine?: No Urinary tract infection?: No Sexually transmitted disease?: No Injury to kidneys or bladder?: No Painful intercourse?: No Weak stream?: No Erection problems?: No Penile pain?: No  Gastrointestinal Nausea?: No Vomiting?: No Indigestion/heartburn?: No Diarrhea?: No Constipation?: No  Constitutional Fever: No Night sweats?: No Weight  loss?: No Fatigue?: No  Skin Skin rash/lesions?: No Itching?: No  Eyes Blurred vision?: No Double vision?: No  Ears/Nose/Throat Sore throat?: No Sinus problems?: No  Hematologic/Lymphatic Swollen glands?: No Easy bruising?: No  Cardiovascular Leg swelling?: No Chest pain?: No  Respiratory Cough?: No Shortness of breath?: No  Endocrine Excessive thirst?: No  Musculoskeletal Back pain?: No Joint pain?: No  Neurological Headaches?: No Dizziness?: No  Psychologic Depression?: No Anxiety?: No  Physical Exam: BP 131/78 mmHg  Pulse 67  Ht 5\' 9"  (1.753 m)  Wt 268 lb 1.6 oz (121.609 kg)  BMI 39.57 kg/m2  Constitutional: Well nourished. Alert and oriented, No acute distress. HEENT: [place] AT, moist mucus membranes. Trachea midline, no masses. Cardiovascular: No clubbing, cyanosis, or edema. Respiratory: Normal respiratory effort, no increased work of  breathing. GI: Abdomen is soft, non tender, non distended, no abdominal masses. Liver and spleen not palpable.  No hernias appreciated.  Stool sample for occult testing is not indicated.   GU: No CVA tenderness.  No bladder fullness or masses.  Patient with circumcised phallus.   Urethral meatus is patent.  No penile discharge. No penile lesions or rashes. Scrotum without lesions, cysts, rashes and/or edema.  Testicles are located scrotally bilaterally. No masses are appreciated in the testicles. Left and right epididymis are normal. Rectal: Patient with  normal sphincter tone. Anus and perineum without scarring or rashes. No rectal masses are appreciated. Prostate is approximately 35 grams, no nodules are appreciated. Seminal vesicles are normal. Skin: No rashes, bruises or suspicious lesions. Lymph: No cervical or inguinal adenopathy. Neurologic: Grossly intact, no focal deficits, moving all 4 extremities. Psychiatric: Normal mood and affect.  Laboratory Data: PSA History  0.9 ng/mL on 07/10/2015  0.9 ng/mL on 11/28/2015     Lab Results  Component Value Date   WBC 4.2 02/11/2016   HGB 14.4 02/11/2016   HCT 48.6 05/03/2016   MCV 84.7 02/11/2016   PLT 184 02/11/2016    Lab Results  Component Value Date   CREATININE 0.96 02/11/2016   Lab Results  Component Value Date   TESTOSTERONE 100* 05/03/2016   Lab Results  Component Value Date   TSH 2.290 11/28/2015   Lab Results  Component Value Date   AST 16 02/10/2016   Lab Results  Component Value Date   ALT 21 02/10/2016    Assessment & Plan:    1. 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  His recent serum creatinine was 1.14 on 03/13/2016. Renal ultrasound performed on 12/05/2015 noted native kidneys atrophic with cortical thinning an increased echotexture. No hydronephrosis. No hydronephrosis within the  transplanted kidney. He is not experiencing back pain at this time.    2. Erectile dysfunction:   SHIM score is 16.  He has good success with PDE5-inhibitors.  He will continue the Viagra.  We will repeat the SHIM score and exam in 6 months.  3. Hypogonadism:   He is currently managing his hypogonadism with testosterone cypionate 200 mg IM every three weeks.   He has received an injection today and will RTC in 1 week for  testosterone level.    4. BPH (benign prostatic hyperplasia) with LUTS:   IPSS score is 8/2.    He will RTC in 6 months for IPSS score, exam and PSA.     Return in about 1 week (around 05/30/2016) for return  for testosterone draw, does not have to be a morning appointment.  These notes generated with voice recognition software. I apologize for typographical errors.  Zara Council, [facility] [address] [place], [place] [address] [phone]

## 2016-05-23 NOTE — Progress Notes (Signed)
Testosterone IM Injection  Due to Hypogonadism patient is present today for a Testosterone Injection.  Medication: Testosterone Cypionate Dose: 51mL Location: left upper outer buttocks Lot: [id number] Exp:09/2017  Patient tolerated well, no complications were noted  Preformed by: Toniann Fail, LPN

## 2016-05-24 LAB — PSA: PROSTATE SPECIFIC AG, SERUM: 0.7 ng/mL (ref 0.0–4.0)

## 2016-05-28 ENCOUNTER — Telehealth: Payer: Self-pay

## 2016-05-28 NOTE — Telephone Encounter (Signed)
Spoke with pt in reference to PSA results. Pt voiced understanding.  

## 2016-05-28 NOTE — Telephone Encounter (Signed)
-----   Message from Nori Riis, PA-C sent at 05/24/2016  8:12 AM EDT ----- Please tell the patient that his PSA is stable.

## 2016-06-13 ENCOUNTER — Ambulatory Visit: Payer: BLUE CROSS/BLUE SHIELD

## 2016-06-17 ENCOUNTER — Telehealth: Payer: Self-pay | Admitting: Urology

## 2016-06-17 ENCOUNTER — Other Ambulatory Visit: Payer: Self-pay | Admitting: Urology

## 2016-06-17 MED ORDER — CLOMIPHENE CITRATE 50 MG PO TABS: ORAL_TABLET | ORAL | Status: DC

## 2016-06-17 NOTE — Telephone Encounter (Signed)
Chad Mccann wants you to call him he said he wants to discuss a medication with you that you two had previously talked about. You asked him to call you about it? He did not say what it was.   Sharyn Lull

## 2016-06-17 NOTE — Telephone Encounter (Signed)
Patient wanted to try Clomid. Script to his pharmacy.

## 2016-06-18 ENCOUNTER — Telehealth: Payer: Self-pay | Admitting: Urology

## 2016-06-18 ENCOUNTER — Ambulatory Visit (INDEPENDENT_AMBULATORY_CARE_PROVIDER_SITE_OTHER): Payer: BLUE CROSS/BLUE SHIELD

## 2016-06-18 DIAGNOSIS — E291 Testicular hypofunction: Secondary | ICD-10-CM

## 2016-06-18 MED ORDER — TESTOSTERONE CYPIONATE 200 MG/ML IM SOLN
200.0000 mg | Freq: Once | INTRAMUSCULAR | Status: AC
  Administered 2016-06-18: 200 mg via INTRAMUSCULAR

## 2016-06-18 NOTE — Telephone Encounter (Signed)
That is fine to continue the depot testosterone injections until the Clomid is available.

## 2016-06-18 NOTE — Progress Notes (Unsigned)
Spoke with pt in reference to clomid. Pt stated that he was told by pharmacy of national shortage. Pt requested to keep injections until shortage is gone. Please advise.

## 2016-06-18 NOTE — Progress Notes (Signed)
Testosterone IM Injection  Due to Hypogonadism patient is present today for a Testosterone Injection.  Medication: Testosterone Cypionate Dose: 39mL Location: right upper outer buttocks Lot: [id number] Exp:09/2017  Patient tolerated well, no complications were noted  Preformed by: Toniann Fail, LPN

## 2016-07-09 ENCOUNTER — Ambulatory Visit (INDEPENDENT_AMBULATORY_CARE_PROVIDER_SITE_OTHER): Payer: BLUE CROSS/BLUE SHIELD

## 2016-07-09 DIAGNOSIS — E291 Testicular hypofunction: Secondary | ICD-10-CM

## 2016-07-09 MED ORDER — TESTOSTERONE CYPIONATE 200 MG/ML IM SOLN
200.0000 mg | Freq: Once | INTRAMUSCULAR | Status: AC
  Administered 2016-07-09: 200 mg via INTRAMUSCULAR

## 2016-07-09 NOTE — Progress Notes (Signed)
Testosterone IM Injection  Due to Hypogonadism patient is present today for a Testosterone Injection.  Medication: Testosterone Cypionate Dose: 58mL Location: left upper outer buttocks Lot: [id number] Exp:09/2017  Patient tolerated well, no complications were noted  Preformed by: Toniann Fail, LPN   Follow up: 3 weeks

## 2016-07-30 ENCOUNTER — Encounter: Payer: Self-pay | Admitting: Urology

## 2016-07-30 ENCOUNTER — Ambulatory Visit: Payer: BLUE CROSS/BLUE SHIELD

## 2017-04-08 ENCOUNTER — Ambulatory Visit: Payer: BLUE CROSS/BLUE SHIELD | Admitting: Urology

## 2017-04-08 NOTE — Progress Notes (Signed)
4:42 PM   Chad Mccann 06/11/1966 [id number]  Referring provider: Shepard General, MD [facility] [address] [place], [place] [address]  Chief Complaint  Patient presents with  . Hypogonadism    last seen 05/2016 follow up  . Benign Prostatic Hypertrophy    HPI: Patient is a 51 year old Caucasian male with a history of a renal transplant, erectile dysfunction, hypogonadism and BPH with LUTS who presents today for his 6 months recheck.  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  His recent serum creatinine was 1.05 on 03/28/2017.Marland Kitchen Renal ultrasound performed on 12/05/2015 noted native kidneys atrophic with cortical thinning an increased echotexture. No hydronephrosis. No hydronephrosis within the transplanted kidney. He is not experiencing back pain at this time.  Followed at [facility].   Erectile dysfunction His SHIM score is 8, which is moderate erectile dysfunction.   His previous SHIM score was 16.  He has been having difficulty with erections for the last several years.   His major complaint is maintaining an erections.  His libido is preserved.   His risk factors for ED are hyperlipidemia, HTN, hypogonadism and age .  He denies any painful erections or curvatures with his erections.   He has tried PDE5-inhibitors in the past with good results.        SHIM    Row Name 04/09/17 1618         SHIM: Over the last 6 months:   How do you rate your confidence that you could get and keep an erection? Very Low     When you had erections with sexual stimulation, how often were your erections hard enough for penetration (entering your partner)? Almost Never or Never     During sexual intercourse, how often were you able to maintain your erection after you had penetrated (entered) your partner? A Few Times (much less than half the  time)     During sexual intercourse, how difficult was it to maintain your erection to completion of intercourse? Very Difficult     When you attempted sexual intercourse, how often was it satisfactory for you? A Few Times (much less than half the time)       SHIM Total Score   SHIM 8        Score: 1-7 Severe ED 8-11 Moderate ED 12-16 Mild-Moderate ED 17-21 Mild ED 22-25 No ED  Hypogonadism Patient is experiencing a decrease in libido, a lack of energy, a decrease in strength and a recent deterioration in his ability to play sports.  This is indicated by his responses to the ADAM questionnaire.  His pretreatment testosterone level was 116 ng/dL on 05/14/2012.  He is currently managing his hypogonadism with Clomid 50 mg, 1/2 tablet daily.  He is not happy with this treatment regimen.  He is not having spontaneous erections.  He does sleep with a CPAP machine.    His last testosterone was 100 ng/dL on 05/03/2016.     Androgen Deficiency in the Aging Male    [place] Name 04/09/17 1600         Androgen Deficiency in the Aging Male   Do you have a decrease in libido (sex drive) Yes     Do you have lack of energy Yes     Do you have a decrease in strength and/or endurance Yes  Have you lost height No     Have you noticed a decreased "enjoyment of life" No     Are you sad and/or grumpy No     Are your erections less strong No     Have you noticed a recent deterioration in your ability to play sports Yes     Are you falling asleep after dinner No     Has there been a recent deterioration in your work performance No        BPH WITH LUTS His IPSS score today is 7, which is mild lower urinary tract symptomatology. He is mostly satisfied with his quality life due to his urinary symptoms.   His major complaint is nocturia  2   His previous I PSS score was 8/1.  He denies any dysuria, hematuria or suprapubic pain.  He also denies any recent fevers, chills, nausea or vomiting.  He does not  have a family history of PCa.     IPSS    Row Name 04/09/17 1600         International Prostate Symptom Score   How often have you had the sensation of not emptying your bladder? Not at All     How often have you had to urinate less than every two hours? Less than half the time     How often have you found you stopped and started again several times when you urinated? Not at All     How often have you found it difficult to postpone urination? About half the time     How often have you had a weak urinary stream? Not at All     How often have you had to strain to start urination? Not at All     How many times did you typically get up at night to urinate? 2 Times     Total IPSS Score 7       Quality of Life due to urinary symptoms   If you were to spend the rest of your life with your urinary condition just the way it is now how would you feel about that? Mostly Satisfied        Score:  1-7 Mild 8-19 Moderate 20-35 Severe   PMH: Past Medical History:  Diagnosis Date  . Benign prostatic hypertrophy   . Bladder spasm   . Erectile dysfunction   . Frequency   . Hypertension   . Hypogonadism in male   . Kidney 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   . Kidney, malrotation   . MGUS (monoclonal gammopathy of unknown significance)   . Monoclonal paraproteinemia   . Obesity, unspecified   . Other specified congenital anomaly of kidney   . Other specified disorders of bladder   . Other testicular hypofunction   . Premature ejaculation   . Sleep apnea   . Ventral hernia     Surgical History: Past Surgical History:  Procedure Laterality Date  . COLONOSCOPY     polyp removal  . dialysis port placement    . HERNIA REPAIR  12/29/13   ventral  . KIDNEY TRANSPLANT  01/2012  . SPINE SURGERY     cervical fusion    Home Medications:  Allergies as of 04/09/2017      Reactions   Hydralazine    Morphine And Related    Prednisone    hallucinations   Shellfish Allergy Nausea And Vomiting       Medication List  Accurate as of 04/09/17  4:42 PM. Always use your most recent med list.          amLODipine 5 MG tablet Commonly known as:  NORVASC Take 10 mg by mouth.   aspirin 81 MG tablet Take 81 mg by mouth daily.   atorvastatin 10 MG tablet Commonly known as:  LIPITOR Take 10 mg by mouth daily.   CIALIS 20 MG tablet Generic drug:  tadalafil TAKE ONE TABLET ONE HOUR PRIOR TO SEXUALACTIVITY   ciprofloxacin 500 MG tablet Commonly known as:  CIPRO Take 1 tablet (500 mg total) by mouth 2 (two) times daily.   clomiPHENE 50 MG tablet Commonly known as:  CLOMID Take 1/2 tablet daily   clonazePAM 0.5 MG tablet Commonly known as:  KLONOPIN Take 0.5 mg by mouth 2 (two) times daily as needed for anxiety.   coal tar-salicylic acid 2 % shampoo Reported on 05/23/2016   fluorouracil 5 % cream Commonly known as:  EFUDEX Reported on 05/23/2016   metoprolol 50 MG tablet Commonly known as:  LOPRESSOR Take 50 mg by mouth.   metroNIDAZOLE 500 MG tablet Commonly known as:  FLAGYL Take 1 tablet (500 mg total) by mouth every 8 (eight) hours.   omega-3 acid ethyl esters 1 g capsule Commonly known as:  LOVAZA Take 1 g by mouth 2 (two) times daily.   omeprazole 20 MG capsule Commonly known as:  PRILOSEC Take 20 mg by mouth daily. Reported on 05/23/2016   oxyCODONE 10 mg 12 hr tablet Commonly known as:  OXYCONTIN Take 1 tablet (10 mg total) by mouth every 12 (twelve) hours.   oxyCODONE-acetaminophen 5-325 MG tablet Commonly known as:  PERCOCET/ROXICET Take 1-2 tablets by mouth every 4 (four) hours as needed for moderate pain or severe pain.   ranitidine 150 MG tablet Commonly known as:  ZANTAC Take 150 mg by mouth.   ranitidine 150 MG tablet Commonly known as:  ZANTAC Take 150 mg by mouth.   sildenafil 100 MG tablet Commonly known as:  VIAGRA Take 1 tablet (100 mg total) by mouth daily as needed for erectile dysfunction.   tacrolimus 1 MG capsule Commonly  known as:  PROGRAF Take 4 mg by mouth 2 (two) times daily.   testosterone cypionate 200 MG/ML injection Commonly known as:  DEPOTESTOSTERONE CYPIONATE Inject 2.5 mLs (500 mg total) into the muscle every 14 (fourteen) days.   urea 40 % Crea Commonly known as:  CARMOL Apply nightly to heels and occlude as discussed.       Allergies:  Allergies  Allergen Reactions  . Hydralazine   . Morphine And Related   . Prednisone     hallucinations  . Shellfish Allergy Nausea And Vomiting    Family History: Family History  Problem Relation Age of Onset  . Colon cancer Father 80  . Heart attack Father   . Stroke Brother   . Colon cancer Maternal Grandfather   . Kidney disease Neg Hx   . Prostate cancer Neg Hx   . Kidney cancer Neg Hx     Social History:  reports that he has never smoked. He has never used smokeless tobacco. He reports that he drinks alcohol. He reports that he does not use drugs.  ROS: UROLOGY Frequent Urination?: No Hard to postpone urination?: No Burning/pain with urination?: No Get up at night to urinate?: Yes Leakage of urine?: No Urine stream starts and stops?: No Trouble starting stream?: No Do you have to strain to urinate?: No  Blood in urine?: No Urinary tract infection?: No Sexually transmitted disease?: No Injury to kidneys or bladder?: No Painful intercourse?: No Weak stream?: No Erection problems?: Yes Penile pain?: No  Gastrointestinal Nausea?: No Vomiting?: No Indigestion/heartburn?: No Diarrhea?: No Constipation?: No  Constitutional Fever: No Night sweats?: No Weight loss?: No Fatigue?: No  Skin Skin rash/lesions?: No Itching?: No  Eyes Blurred vision?: No Double vision?: No  Ears/Nose/Throat Sore throat?: No Sinus problems?: No  Hematologic/Lymphatic Swollen glands?: No Easy bruising?: No  Cardiovascular Leg swelling?: No Chest pain?: No  Respiratory Cough?: No Shortness of breath?: No  Endocrine Excessive  thirst?: No  Musculoskeletal Back pain?: Yes Joint pain?: No  Neurological Headaches?: No Dizziness?: No  Psychologic Depression?: No Anxiety?: No  Physical Exam: BP 136/85   Pulse 89   Ht 5\' 10"  (1.778 m)   Wt 278 lb 1.6 oz (126.1 kg)   BMI 39.90 kg/m   Constitutional: Well nourished. Alert and oriented, No acute distress. HEENT: [place] AT, moist mucus membranes. Trachea midline, no masses. Cardiovascular: No clubbing, cyanosis, or edema. Respiratory: Normal respiratory effort, no increased work of breathing. GI: Abdomen is soft, non tender, non distended, no abdominal masses. Liver and spleen not palpable.  No hernias appreciated.  Stool sample for occult testing is not indicated.   GU: No CVA tenderness.  No bladder fullness or masses.  Patient with circumcised phallus.   Urethral meatus is patent.  No penile discharge. No penile lesions or rashes. Scrotum without lesions, cysts, rashes and/or edema.  Testicles are located scrotally bilaterally. No masses are appreciated in the testicles. Left and right epididymis are normal. Rectal: Patient with  normal sphincter tone. Anus and perineum without scarring or rashes. No rectal masses are appreciated. Prostate is approximately 35 grams, no nodules are appreciated. Seminal vesicles are normal. Skin: No rashes, bruises or suspicious lesions. Lymph: No cervical or inguinal adenopathy. Neurologic: Grossly intact, no focal deficits, moving all 4 extremities. Psychiatric: Normal mood and affect.  Laboratory Data: PSA History  0.9 ng/mL on 07/10/2015  0.9 ng/mL on 11/28/2015  0.7 ng/mL on 05/23/2016     Lab Results  Component Value Date   WBC 4.2 02/11/2016   HGB 14.4 02/11/2016   HCT 48.6 05/03/2016   MCV 84.7 02/11/2016   PLT 184 02/11/2016    Lab Results  Component Value Date   CREATININE 0.96 02/11/2016   Lab Results  Component Value Date   TESTOSTERONE 100 (L) 05/03/2016   Lab Results  Component Value Date   TSH  2.290 11/28/2015   Lab Results  Component Value Date   AST 16 02/10/2016   Lab Results  Component Value Date   ALT 21 02/10/2016    Assessment & Plan:    1. History of renal transplant  - followed by [facility]  2. Hypogonadism:     -most recent testosterone level is 100 ng/dL on 05/03/2016  -discontinue Clomid 50 mg and restart testosterone cypionate 200 mg IM, 1 cc every three weeks  -RTC in 3 months for HCT and testosterone  -RTC in 6 months for HCT, testosterone, PSA, LFT's, ADAM and exam  3. BPH with LUTS  - IPSS score is 7/2, it is improving  - Continue conservative management, avoiding bladder irritants and timed voiding's  - RTC in 6 months for IPSS, PSA, PVR and exam, as testosterone therapy can cause prostate enlargement and worsen LUTS  4. Erectile dysfunction:     -SHIM score is 8  -continue PDE5-inhibitors  -  RTC in 6 months for SHIM score and exam, as testosterone therapy can affect erections   Return for pending labs.  These notes generated with voice recognition software. I apologize for typographical errors.  Zara Council, [facility] [address], [place], [place] [address] [phone]

## 2017-04-09 ENCOUNTER — Encounter: Payer: Self-pay | Admitting: Urology

## 2017-04-09 ENCOUNTER — Ambulatory Visit (INDEPENDENT_AMBULATORY_CARE_PROVIDER_SITE_OTHER): Payer: BLUE CROSS/BLUE SHIELD | Admitting: Urology

## 2017-04-09 VITALS — BP 136/85 | HR 89 | Ht 70.0 in | Wt 278.1 lb

## 2017-04-09 DIAGNOSIS — N529 Male erectile dysfunction, unspecified: Secondary | ICD-10-CM | POA: Diagnosis not present

## 2017-04-09 DIAGNOSIS — N138 Other obstructive and reflux uropathy: Secondary | ICD-10-CM

## 2017-04-09 DIAGNOSIS — Z940 Kidney transplant status: Secondary | ICD-10-CM | POA: Diagnosis not present

## 2017-04-09 DIAGNOSIS — N401 Enlarged prostate with lower urinary tract symptoms: Secondary | ICD-10-CM | POA: Diagnosis not present

## 2017-04-09 DIAGNOSIS — E291 Testicular hypofunction: Secondary | ICD-10-CM

## 2017-04-09 MED ORDER — TESTOSTERONE CYPIONATE 200 MG/ML IM SOLN
200.0000 mg | Freq: Once | INTRAMUSCULAR | Status: AC
  Administered 2017-04-09: 200 mg via INTRAMUSCULAR

## 2017-04-09 NOTE — Progress Notes (Signed)
Testosterone IM Injection  Due to Hypogonadism patient is present today for a Testosterone Injection.  Medication: Testosterone Cypionate Dose: 1 ml Location: right upper outer buttocks Lot: [id number] Exp:09/2017  Patient tolerated well, no complications were noted.  Preformed by: Lyndee Hensen CMA  Follow up: 3 weeks

## 2017-04-10 ENCOUNTER — Telehealth: Payer: Self-pay

## 2017-04-10 LAB — HEPATIC FUNCTION PANEL
ALBUMIN: 4.5 g/dL (ref 3.5–5.5)
ALT: 18 IU/L (ref 0–44)
AST: 15 IU/L (ref 0–40)
Alkaline Phosphatase: 53 IU/L (ref 39–117)
Bilirubin Total: 0.4 mg/dL (ref 0.0–1.2)
Bilirubin, Direct: 0.11 mg/dL (ref 0.00–0.40)
Total Protein: 6.8 g/dL (ref 6.0–8.5)

## 2017-04-10 LAB — HEMATOCRIT: HEMATOCRIT: 42 % (ref 37.5–51.0)

## 2017-04-10 LAB — TESTOSTERONE: Testosterone: 103 ng/dL — ABNORMAL LOW (ref 264–916)

## 2017-04-10 LAB — PSA: PROSTATE SPECIFIC AG, SERUM: 0.6 ng/mL (ref 0.0–4.0)

## 2017-04-10 NOTE — Telephone Encounter (Signed)
-----   Message from Nori Riis, PA-C sent at 04/10/2017  7:56 AM EDT ----- Please let Mr. Tarlton know that his labs are good, but his testosterone is low.  We made the right decision to go back to the injections.

## 2017-04-10 NOTE — Telephone Encounter (Signed)
Spoke with pt in reference to lab results. Pt voiced understanding.  

## 2017-04-30 ENCOUNTER — Ambulatory Visit (INDEPENDENT_AMBULATORY_CARE_PROVIDER_SITE_OTHER): Payer: BLUE CROSS/BLUE SHIELD

## 2017-04-30 DIAGNOSIS — E291 Testicular hypofunction: Secondary | ICD-10-CM

## 2017-04-30 MED ORDER — TESTOSTERONE CYPIONATE 200 MG/ML IM SOLN: 500.0000 mg | INTRAMUSCULAR | 0 refills | Status: DC

## 2017-04-30 NOTE — Progress Notes (Signed)
Testosterone IM Injection  Due to Hypogonadism patient is present today for a Testosterone Injection.  Medication: Testosterone Cypionate Dose: 38ml Location: L upper outer buttocks Lot: [id number]   Exp:09/2017   Patient tolerated well, no complications.  Preformed by: C.Corinna Capra, CMA  Follow up: 3 weeks

## 2017-05-01 MED ORDER — TESTOSTERONE CYPIONATE 200 MG/ML IM SOLN
200.0000 mg | Freq: Once | INTRAMUSCULAR | Status: AC
  Administered 2017-05-01: 200 mg via INTRAMUSCULAR

## 2017-05-01 MED ORDER — TESTOSTERONE CYPIONATE 200 MG/ML IM SOLN: 200.0000 mg | Freq: Once | INTRAMUSCULAR | Status: DC

## 2017-05-01 NOTE — Addendum Note (Signed)
Addended by: Milta Deiters on: 05/01/2017 11:25 AM   Modules accepted: Orders

## 2017-05-21 ENCOUNTER — Encounter: Payer: Self-pay | Admitting: Urology

## 2017-05-21 ENCOUNTER — Ambulatory Visit: Payer: BLUE CROSS/BLUE SHIELD

## 2017-05-28 ENCOUNTER — Ambulatory Visit (INDEPENDENT_AMBULATORY_CARE_PROVIDER_SITE_OTHER): Payer: No Typology Code available for payment source

## 2017-05-28 DIAGNOSIS — E291 Testicular hypofunction: Secondary | ICD-10-CM | POA: Diagnosis not present

## 2017-05-28 MED ORDER — TESTOSTERONE CYPIONATE 200 MG/ML IM SOLN
200.0000 mg | Freq: Once | INTRAMUSCULAR | Status: AC
  Administered 2017-05-28: 200 mg via INTRAMUSCULAR

## 2017-05-28 NOTE — Progress Notes (Signed)
Testosterone IM Injection  Due to Hypogonadism patient is present today for a Testosterone Injection.  Medication: Testosterone Cypionate Dose: 65ml Location: left upper outer buttocks Lot: [id number] Exp:09/2017  Patient tolerated well, no complications were noted  Preformed by: C. Corinna Capra, CMA  Follow up: 3 weeks

## 2017-06-18 ENCOUNTER — Ambulatory Visit (INDEPENDENT_AMBULATORY_CARE_PROVIDER_SITE_OTHER): Payer: No Typology Code available for payment source

## 2017-06-18 DIAGNOSIS — E291 Testicular hypofunction: Secondary | ICD-10-CM

## 2017-06-18 MED ORDER — TESTOSTERONE CYPIONATE 200 MG/ML IM SOLN
200.0000 mg | Freq: Once | INTRAMUSCULAR | Status: AC
  Administered 2017-06-18: 200 mg via INTRAMUSCULAR

## 2017-06-18 NOTE — Progress Notes (Signed)
Testosterone IM Injection  Due to Hypogonadism patient is present today for a Testosterone Injection.  Medication: Testosterone Cypionate Dose: 1 ml Location: left upper outer buttocks (per pt request) Lot: [id number] Exp:09/2017  Patient tolerated well, no complications were noted  Performed by: C. Corinna Capra, CMA  Follow up: 3 weeks

## 2017-07-09 ENCOUNTER — Ambulatory Visit (INDEPENDENT_AMBULATORY_CARE_PROVIDER_SITE_OTHER): Payer: No Typology Code available for payment source

## 2017-07-09 DIAGNOSIS — E291 Testicular hypofunction: Secondary | ICD-10-CM

## 2017-07-09 MED ORDER — TESTOSTERONE CYPIONATE 200 MG/ML IM SOLN
200.0000 mg | Freq: Once | INTRAMUSCULAR | Status: AC
  Administered 2017-07-09: 200 mg via INTRAMUSCULAR

## 2017-07-09 NOTE — Progress Notes (Signed)
Testosterone IM Injection  Due to Hypogonadism patient is present today for a Testosterone Injection.  Medication: Testosterone Cypionate Dose: 1 ml Location: right upper outer buttocks Lot: [id number]  Exp:09/2017  .   Patient tolerated well, no complications were noted  Preformed by: C. Corinna Capra, CMA  Follow up: 3 weeks

## 2017-07-30 ENCOUNTER — Ambulatory Visit (INDEPENDENT_AMBULATORY_CARE_PROVIDER_SITE_OTHER): Payer: No Typology Code available for payment source

## 2017-07-30 DIAGNOSIS — E291 Testicular hypofunction: Secondary | ICD-10-CM

## 2017-07-31 MED ORDER — TESTOSTERONE CYPIONATE 200 MG/ML IM SOLN
200.0000 mg | Freq: Once | INTRAMUSCULAR | Status: AC
  Administered 2017-07-31: 200 mg via INTRAMUSCULAR

## 2017-07-31 NOTE — Progress Notes (Signed)
Testosterone IM Injection  Due to Hypogonadism patient is present today for a Testosterone Injection.  Medication: Testosterone Cypionate Dose: 10mL Location: left upper outer buttocks Lot: [id number] Exp:09/2018  Patient tolerated well, no complications were noted  Preformed by: Toniann Fail, LPN   Follow up: 3 weeks

## 2017-08-20 ENCOUNTER — Ambulatory Visit: Payer: Self-pay

## 2017-08-22 ENCOUNTER — Ambulatory Visit (INDEPENDENT_AMBULATORY_CARE_PROVIDER_SITE_OTHER): Payer: No Typology Code available for payment source

## 2017-08-22 DIAGNOSIS — E291 Testicular hypofunction: Secondary | ICD-10-CM

## 2017-08-22 MED ORDER — TESTOSTERONE CYPIONATE 200 MG/ML IM SOLN
200.0000 mg | Freq: Once | INTRAMUSCULAR | Status: AC
  Administered 2017-08-22: 200 mg via INTRAMUSCULAR

## 2017-08-22 NOTE — Progress Notes (Signed)
Testosterone IM Injection  Due to Hypogonadism patient is present today for a Testosterone Injection.  Medication: Testosterone Cypionate Dose: 29mL Location: right upper outer buttocks Lot: [id number] Exp:10/19  Patient tolerated well, no complications were noted  Preformed by: Toniann Fail, LPN   Follow up: 3 weeks

## 2017-09-04 ENCOUNTER — Other Ambulatory Visit: Payer: Self-pay | Admitting: Urology

## 2017-09-04 ENCOUNTER — Telehealth: Payer: Self-pay | Admitting: Urology

## 2017-09-04 NOTE — Telephone Encounter (Signed)
Patient is due for an appointment with me.  He will need a testosterone drawn one week after he receives his injection, HBG, HCT and PSA.

## 2017-09-04 NOTE — Telephone Encounter (Signed)
Appointments made

## 2017-09-11 ENCOUNTER — Ambulatory Visit (INDEPENDENT_AMBULATORY_CARE_PROVIDER_SITE_OTHER): Payer: 59 | Admitting: Podiatry

## 2017-09-11 ENCOUNTER — Ambulatory Visit: Payer: 59

## 2017-09-11 ENCOUNTER — Other Ambulatory Visit: Payer: Self-pay | Admitting: Podiatry

## 2017-09-11 ENCOUNTER — Encounter: Payer: Self-pay | Admitting: Podiatry

## 2017-09-11 ENCOUNTER — Ambulatory Visit (INDEPENDENT_AMBULATORY_CARE_PROVIDER_SITE_OTHER): Payer: 59

## 2017-09-11 VITALS — BP 131/75 | HR 70

## 2017-09-11 DIAGNOSIS — M722 Plantar fascial fibromatosis: Secondary | ICD-10-CM

## 2017-09-11 DIAGNOSIS — R52 Pain, unspecified: Secondary | ICD-10-CM

## 2017-09-11 MED ORDER — MELOXICAM 15 MG PO TABS: 15.0000 mg | ORAL_TABLET | Freq: Every day | ORAL | 0 refills | Status: DC

## 2017-09-11 NOTE — Patient Instructions (Signed)

## 2017-09-11 NOTE — Progress Notes (Signed)
   Subjective:    Patient ID: Chad Mccann, male    DOB: 04-25-1966, 51 y.o.   MRN: [id number]  HPI this patient presents the office with chief complaint of a painful left heel.  He says his heel has been painful for approximately 6 months.  He says he has provided no self treatment nor sought any professional help.  He says that pain is present upon rising in the morning and standing from a sitting position.  Patient states he works in [employer] and does significant amount of standing and walking.  He states when he stands in the a.m. he needs to walk on his toes. Due to the severity of the pain in his left heel.  He says he has purchased a new pair shoes, but that has not provided much relief.  He presents the office today for an evaluation and treatment of his painful left heel    Review of Systems  Endocrine: Positive for heat intolerance.  Musculoskeletal: Positive for back pain.  Allergic/Immunologic: Positive for food allergies.       Objective:   Physical Exam GENERAL APPEARANCE: Alert, conversant. Appropriately groomed. No acute distress.  VASCULAR: Pedal pulses are  palpable at  [facility] and PT bilateral.  Capillary refill time is immediate to all digits,  Normal temperature gradient.  Digital hair growth is present bilateral  NEUROLOGIC: sensation is normal to 5.07 monofilament at 5/5 sites bilateral.  Light touch is intact bilateral, Muscle strength normal.  MUSCULOSKELETAL: acceptable muscle strength, tone and stability bilateral.  Intrinsic muscluature intact bilateral.  Rectus appearance of foot and digits noted bilateral. Palpable pain noted at the insertion of the plantar fascia of the left heel.  Swelling is noted on the plantar heels on both feet.  Patient is mildly guarding his left ankle/foot.  DERMATOLOGIC: skin color, texture, and turgor are within normal limits.  No preulcerative lesions or ulcers  are seen, no interdigital maceration noted.  No open lesions present.  Digital  nails are asymptomatic. No drainage noted.         Assessment & Plan:  Heel spur syndrome, left foot.  Plantar fasciitis, left foot.   Initial exam.  X-ray was taken of both feet and both reveal calcification at the insertion of the plantar fascia and calcification at the insertion of the Achilles tendon.  Discussed stretching and icing of his left foot.  Prescribed power step insoles to be worn in his shoes.  Injection therapy was performed.  Prescribed Mobic to be taken orally.  He is to return the office in 3 weeks for further evaluation and treatment.   Gardiner Barefoot DPM

## 2017-09-12 ENCOUNTER — Ambulatory Visit (INDEPENDENT_AMBULATORY_CARE_PROVIDER_SITE_OTHER): Payer: 59

## 2017-09-12 DIAGNOSIS — E291 Testicular hypofunction: Secondary | ICD-10-CM | POA: Diagnosis not present

## 2017-09-12 MED ORDER — TESTOSTERONE CYPIONATE 200 MG/ML IM SOLN
200.0000 mg | Freq: Once | INTRAMUSCULAR | Status: AC
  Administered 2017-09-12: 200 mg via INTRAMUSCULAR

## 2017-09-12 NOTE — Progress Notes (Signed)
Testosterone IM Injection  Due to Hypogonadism patient is present today for a Testosterone Injection.  Medication: Testosterone Cypionate Dose: 3mL Location: right upper outer buttocks Lot: [id number] Exp:09/2018  Patient tolerated well, no complications were noted  Preformed by: Toniann Fail, LPN   Follow up: Next week pt gets labs and has a follow up with [facility] 10/01/17

## 2017-09-19 ENCOUNTER — Other Ambulatory Visit: Payer: No Typology Code available for payment source

## 2017-09-30 NOTE — Progress Notes (Deleted)
10:02 PM   Chad Mccann 03/27/1966 [id number]  Referring provider: She[facility], MD [facility] [address], [place] [address]  No chief complaint on file.   HPI: Patient is a 51 year old Caucasian male with a history of a renal transplant, erectile dysfunction, testosterone deficiency and BPH with LUTS who presents today for his 6 months recheck.  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  His recent serum creatinine was 1.05 on 03/28/2017.Marland Kitchen Renal ultrasound performed on 12/05/2015 noted native kidneys atrophic with cortical thinning an increased echotexture. No hydronephrosis. No hydronephrosis within the transplanted kidney. He is not experiencing back pain at this time.  Followed at [facility].   Erectile dysfunction His SHIM score is ***, which is *** erectile dysfunction.   His previous SHIM score was 8.  He has been having difficulty with erections for the last several years.   His major complaint is maintaining an erections.  His libido is preserved.   His risk factors for ED are hyperlipidemia, HTN, testosterone deficiency and age .  He denies any painful erections or curvatures with his erections.   He has tried PDE5-inhibitors in the past with good results.      Score: 1-7 Severe ED 8-11 Moderate ED 12-16 Mild-Moderate ED 17-21 Mild ED 22-25 No ED  Testosterone deficiency Patient is experiencing a decrease in libido, a lack of energy, a decrease in strength and a recent deterioration in his ability to play sports.  This is indicated by his responses to the ADAM questionnaire.  His pretreatment testosterone level was 116 ng/dL on 05/14/2012.  He is currently managing his hypogonadism with testosterone cypionate 200 mg/cc,  1 cc IM q 3 weeks.  He is not happy with this treatment regimen.  He is not having  spontaneous erections.  He does sleep with a CPAP machine.    His last testosterone was 103 ng/dL on 04/09/2017.   BPH WITH LUTS His IPSS score today is ***, which is *** lower urinary tract symptomatology.  He is *** with his quality life due to his urinary symptoms.   His major complaint is nocturia  2   His previous I PSS score was 7/2.  He denies any dysuria, hematuria or suprapubic pain.  He also denies any recent fevers, chills, nausea or vomiting.  He does not have a family history of PCa.   Score:  1-7 Mild 8-19 Moderate 20-35 Severe   PMH: Past Medical History:  Diagnosis Date  . Benign prostatic hypertrophy   . Bladder spasm   . Erectile dysfunction   . Frequency   . Hypertension   . Hypogonadism in male   . Kidney 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   . Kidney, malrotation   . MGUS (monoclonal gammopathy of unknown significance)   . Monoclonal paraproteinemia   . Obesity, unspecified   . Other specified congenital anomaly of kidney   . Other specified disorders of bladder   . Other testicular hypofunction   . Premature ejaculation   . Sleep apnea   . Ventral hernia     Surgical History: Past Surgical History:  Procedure Laterality Date  . COLONOSCOPY     polyp removal  . dialysis port placement    . HERNIA REPAIR  12/29/13   ventral  . KIDNEY TRANSPLANT  01/2012  . SPINE SURGERY  cervical fusion    Home Medications:  Allergies as of 10/01/2017      Reactions   Hydralazine    Morphine And Related    Prednisone    hallucinations   Shellfish Allergy Nausea And Vomiting      Medication List       Accurate as of 09/30/17 10:02 PM. Always use your most recent med list.          amLODipine 5 MG tablet Commonly known as:  NORVASC Take 10 mg by mouth.   aspirin 81 MG tablet Take 81 mg by mouth daily.   atorvastatin 10 MG tablet Commonly known as:  LIPITOR Take 10 mg by mouth daily.   CIALIS 20 MG tablet Generic drug:  tadalafil TAKE ONE TABLET ONE  HOUR PRIOR TO SEXUALACTIVITY   ciprofloxacin 500 MG tablet Commonly known as:  CIPRO Take 1 tablet (500 mg total) by mouth 2 (two) times daily.   clomiPHENE 50 MG tablet Commonly known as:  CLOMID Take 1/2 tablet daily   clonazePAM 0.5 MG tablet Commonly known as:  KLONOPIN Take 0.5 mg by mouth 2 (two) times daily as needed for anxiety.   coal tar-salicylic acid 2 % shampoo Reported on 05/23/2016   fluorouracil 5 % cream Commonly known as:  EFUDEX Reported on 05/23/2016   meloxicam 15 MG tablet Commonly known as:  MOBIC Take 1 tablet (15 mg total) by mouth daily.   metoprolol tartrate 50 MG tablet Commonly known as:  LOPRESSOR Take 50 mg by mouth.   metroNIDAZOLE 500 MG tablet Commonly known as:  FLAGYL Take 1 tablet (500 mg total) by mouth every 8 (eight) hours.   omega-3 acid ethyl esters 1 g capsule Commonly known as:  LOVAZA Take 1 g by mouth 2 (two) times daily.   omeprazole 20 MG capsule Commonly known as:  PRILOSEC Take 20 mg by mouth daily. Reported on 05/23/2016   oxyCODONE 10 mg 12 hr tablet Commonly known as:  OXYCONTIN Take 1 tablet (10 mg total) by mouth every 12 (twelve) hours.   oxyCODONE-acetaminophen 5-325 MG tablet Commonly known as:  PERCOCET/ROXICET Take 1-2 tablets by mouth every 4 (four) hours as needed for moderate pain or severe pain.   ranitidine 150 MG tablet Commonly known as:  ZANTAC Take 150 mg by mouth.   ranitidine 150 MG tablet Commonly known as:  ZANTAC Take 150 mg by mouth.   sildenafil 100 MG tablet Commonly known as:  VIAGRA TAKE 1 TABLET BY MOUTH EVERY DAY AS NEEDED FOR ERECTILE DYSFUNCTION   tacrolimus 1 MG capsule Commonly known as:  PROGRAF Take 4 mg by mouth 2 (two) times daily.   testosterone cypionate 200 MG/ML injection Commonly known as:  DEPOTESTOSTERONE CYPIONATE Inject 2.5 mLs (500 mg total) into the muscle every 14 (fourteen) days.   urea 40 % Crea Commonly known as:  CARMOL Apply nightly to heels and  occlude as discussed.       Allergies:  Allergies  Allergen Reactions  . Hydralazine   . Morphine And Related   . Prednisone     hallucinations  . Shellfish Allergy Nausea And Vomiting    Family History: Family History  Problem Relation Age of Onset  . Colon cancer Father 92  . Heart attack Father   . Stroke Brother   . Colon cancer Maternal Grandfather   . Kidney disease Neg Hx   . Prostate cancer Neg Hx   . Kidney cancer Neg Hx  Social History:  reports that he has never smoked. He has never used smokeless tobacco. He reports that he drinks alcohol. He reports that he does not use drugs.  ROS:                                        Physical Exam: There were no vitals taken for this visit.  Constitutional: Well nourished. Alert and oriented, No acute distress. HEENT: [place] AT, moist mucus membranes. Trachea midline, no masses. Cardiovascular: No clubbing, cyanosis, or edema. Respiratory: Normal respiratory effort, no increased work of breathing. GI: Abdomen is soft, non tender, non distended, no abdominal masses. Liver and spleen not palpable.  No hernias appreciated.  Stool sample for occult testing is not indicated.   GU: No CVA tenderness.  No bladder fullness or masses.  Patient with circumcised phallus.   Urethral meatus is patent.  No penile discharge. No penile lesions or rashes. Scrotum without lesions, cysts, rashes and/or edema.  Testicles are located scrotally bilaterally. No masses are appreciated in the testicles. Left and right epididymis are normal. Rectal: Patient with  normal sphincter tone. Anus and perineum without scarring or rashes. No rectal masses are appreciated. Prostate is approximately 35 grams, no nodules are appreciated. Seminal vesicles are normal. Skin: No rashes, bruises or suspicious lesions. Lymph: No cervical or inguinal adenopathy. Neurologic: Grossly intact, no focal deficits, moving all 4  extremities. Psychiatric: Normal mood and affect.  Laboratory Data: PSA History  0.9 ng/mL on 07/10/2015  0.9 ng/mL on 11/28/2015  0.7 ng/mL on 05/23/2016  0.6 ng/mL on 04/09/2017   Lab Results  Component Value Date   TESTOSTERONE 103 (L) 04/09/2017   I have reviewed the labs.  Assessment & Plan:    1. History of renal transplant  - followed by [facility]  2. Testosterone deficiency    -most recent testosterone level is 103 ng/dL on 04/09/2017 (therapeutic level 450-600 ng/dL)  -discontinue Clomid 50 mg and restart testosterone cypionate 200 mg IM, 1 cc every three weeks  -RTC in 3 months for HCT, HBG and testosterone  -RTC in 6 months for HCT, testosterone, PSA, ADAM and exam  3. BPH with LUTS  - IPSS score is 7/2, it is improving  - Continue conservative management, avoiding bladder irritants and timed voiding's  - RTC in 6 months for IPSS, PSA, PVR and exam, as testosterone therapy can cause prostate enlargement and worsen LUTS  4. Erectile dysfunction:     -SHIM score is 8  -continue PDE5-inhibitors  -RTC in 6 months for SHIM score and exam, as testosterone therapy can affect erections   No Follow-up on file.  These notes generated with voice recognition software. I apologize for typographical errors.  Zara Council, [facility] [address], [place], [place] [address] [phone]

## 2017-10-01 ENCOUNTER — Encounter: Payer: Self-pay | Admitting: Urology

## 2017-10-01 ENCOUNTER — Ambulatory Visit: Payer: No Typology Code available for payment source | Admitting: Urology

## 2017-10-06 ENCOUNTER — Other Ambulatory Visit: Payer: No Typology Code available for payment source

## 2017-10-06 ENCOUNTER — Encounter: Payer: Self-pay | Admitting: Podiatry

## 2017-10-06 ENCOUNTER — Ambulatory Visit (INDEPENDENT_AMBULATORY_CARE_PROVIDER_SITE_OTHER): Payer: 59 | Admitting: Podiatry

## 2017-10-06 ENCOUNTER — Other Ambulatory Visit: Payer: Self-pay

## 2017-10-06 DIAGNOSIS — E291 Testicular hypofunction: Secondary | ICD-10-CM

## 2017-10-06 DIAGNOSIS — M722 Plantar fascial fibromatosis: Secondary | ICD-10-CM | POA: Diagnosis not present

## 2017-10-06 NOTE — Progress Notes (Signed)
This patient presents the office for continued evaluation of his left heel pain.  He was diagnosed as having plantar fasciitis and was treated with inserts. Mobic an injection therapy.  He says he does not believe that the power steps were beneficial.  He says the Mobic may have helped a little.  He says the injection relieved his pain for 24 weeks, but then the pain has started to return this last week.  He says that his foot is only 25% better than when he was seen 3 weeks ago.  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  He presents the office today for an evaluation and treatment of his left heel.  General Appearance  Alert, conversant and in no acute stress.  Vascular  Dorsalis pedis and posterior pulses are palpable  bilaterally.  Capillary return is within normal limits  Bilaterally. Temperature is within normal limits  Bilaterally  Neurologic  Senn-Weinstein monofilament wire test within normal limits  bilaterally. Muscle power  Within normal limits bilaterally.  Nails Normal nails with no evidence of bacterial or fungal infections.  Orthopedic  No limitations of motion of motion feet bilaterally.  No crepitus or effusions noted.  No bony pathology or digital deformities noted. Palpable pain at the insertion plantar fascia left heel.  Skin  normotropic skin with no porokeratosis noted bilaterally.  No signs of infections or ulcers noted.     Plantar fascitis left foot   ROV.   Evaluated his left foot and found the pain persists at the insertion of the plantar fascia.  Discussed further treatment with this patient.  Explained that the fact that he improved means the inflammation resolved from the initial exam.  The reoccurrence of pain was cause because of the lack of orthotic usage, which would help to prevent the pain.  We discussed treatment with injection therapy as well as immobilization and deep ultrasound treatment and possible surgery.   We chose to treat him with injection therapy and to reevaluate him in 3 weeks.  If the pain persists, we will then consider immobilization with an Unna boot to help to relieve his problem.  Additionally, I told him he needs to be wearing his power step insoles. Despite the fact he does not believe they are beneficial.  Continue the Mobic and return to the office in 3 weeks for further evaluation and treatment.   Gardiner Barefoot DPM

## 2017-10-06 NOTE — Progress Notes (Signed)
11:40 AM   Chad Mccann 02/25/1966 [id number]  Referring provider: Shepard General, MD [facility] [address] [place], [place] [address]  Chief Complaint  Patient presents with  . Hypogonadism    last seen 04/18 last injection 09/12/17    HPI: Patient is a 51 year old Caucasian male with a history of a renal transplant, erectile dysfunction, testosterone deficiency and BPH with LUTS who presents today for his 6 months recheck.  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  His recent serum creatinine was 1.05 on 03/28/2017.Marland Kitchen Renal ultrasound performed on 12/05/2015 noted native kidneys atrophic with cortical thinning an increased echotexture. No hydronephrosis. No hydronephrosis within the transplanted kidney. He is not experiencing back pain at this time.  Followed at [facility].   Erectile dysfunction His SHIM score is 14, which is mild to moderate erectile dysfunction.   His previous SHIM score was 8.  He has been having difficulty with erections for the last several years.   His major complaint is maintaining an erections.  His libido is preserved.   His risk factors for ED are hyperlipidemia, HTN, testosterone deficiency and age .  He denies any painful erections or curvatures with his erections.   He has tried PDE5-inhibitors in the past with good results.        SHIM    Row Name 10/07/17 1055         SHIM: Over the last 6 months:   How do you rate your confidence that you could get and keep an erection? Very Low     When you had erections with sexual stimulation, how often were your erections hard enough for penetration (entering your partner)? Sometimes (about half the time)     During sexual intercourse, how often were you able to maintain your erection after you had penetrated (entered) your partner? Sometimes (about  half the time)     During sexual intercourse, how difficult was it to maintain your erection to completion of intercourse? Slightly Difficult     When you attempted sexual intercourse, how often was it satisfactory for you? Sometimes (about half the time)       SHIM Total Score   SHIM 14        Score: 1-7 Severe ED 8-11 Moderate ED 12-16 Mild-Moderate ED 17-21 Mild ED 22-25 No ED  Testosterone deficiency Patient is experiencing a lack of energy, a decrease in strength and/or endurance, Erections being less strong and a recent deterioration in his ability to play sports. This is indicated by his responses to the ADAM questionnaire.  His pretreatment testosterone level was 116 ng/dL on 05/14/2012.  He is currently managing his hypogonadism with testosterone cypionate 200 mg/cc,  1 cc IM q 3 weeks.  He is not happy with this treatment regimen.  He is not having spontaneous erections.  He does sleep with a CPAP machine.    His last testosterone was 80 ng/dL on 10/06/2017.  HCT and HBG are normal.       Androgen Deficiency in the Aging Male    Addison Name 10/07/17 1000         Androgen Deficiency in the Aging Male   Do you have a decrease in libido (sex drive) No     Do you have lack of energy Yes     Do you have a  decrease in strength and/or endurance Yes     Have you lost height No     Have you noticed a decreased "enjoyment of life" No     Are you sad and/or grumpy No     Are your erections less strong Yes     Have you noticed a recent deterioration in your ability to play sports Yes     Are you falling asleep after dinner No     Has there been a recent deterioration in your work performance No        BPH WITH LUTS His IPSS score today is 6, which is mild lower urinary tract symptomatology.  He is pleased with his quality life due to his urinary symptoms.   His major complaint is nocturia  2   His previous I PSS score was 7/2.  He denies any dysuria, hematuria or suprapubic pain.   He also denies any recent fevers, chills, nausea or vomiting.  He does not have a family history of PCa.     IPSS    Row Name 10/07/17 1000         International Prostate Symptom Score   How often have you had the sensation of not emptying your bladder? Not at All     How often have you had to urinate less than every two hours? Less than half the time     How often have you found you stopped and started again several times when you urinated? Not at All     How often have you found it difficult to postpone urination? About half the time     How often have you had a weak urinary stream? Not at All     How often have you had to strain to start urination? Not at All     How many times did you typically get up at night to urinate? 1 Time     Total IPSS Score 6       Quality of Life due to urinary symptoms   If you were to spend the rest of your life with your urinary condition just the way it is now how would you feel about that? Pleased        Score:  1-7 Mild 8-19 Moderate 20-35 Severe   PMH: Past Medical History:  Diagnosis Date  . Benign prostatic hypertrophy   . Bladder spasm   . Erectile dysfunction   . Frequency   . Hypertension   . Hypogonadism in male   . Kidney 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   . Kidney, malrotation   . MGUS (monoclonal gammopathy of unknown significance)   . Monoclonal paraproteinemia   . Obesity, unspecified   . Other specified congenital anomaly of kidney   . Other specified disorders of bladder   . Other testicular hypofunction   . Premature ejaculation   . Sleep apnea   . Ventral hernia     Surgical History: Past Surgical History:  Procedure Laterality Date  . COLONOSCOPY     polyp removal  . dialysis port placement    . HERNIA REPAIR  12/29/13   ventral  . KIDNEY TRANSPLANT  01/2012  . SPINE SURGERY     cervical fusion    Home Medications:  Allergies as of 10/07/2017      Reactions   Hydralazine    Morphine And Related    Prednisone     hallucinations   Shellfish Allergy Nausea And Vomiting  Medication List       Accurate as of 10/07/17 11:40 AM. Always use your most recent med list.          amLODipine 5 MG tablet Commonly known as:  NORVASC Take 10 mg by mouth.   aspirin 81 MG tablet Take 81 mg by mouth daily.   atorvastatin 10 MG tablet Commonly known as:  LIPITOR Take 10 mg by mouth daily.   CIALIS 20 MG tablet Generic drug:  tadalafil TAKE ONE TABLET ONE HOUR PRIOR TO SEXUALACTIVITY   ciprofloxacin 500 MG tablet Commonly known as:  CIPRO Take 1 tablet (500 mg total) by mouth 2 (two) times daily.   clomiPHENE 50 MG tablet Commonly known as:  CLOMID Take 1/2 tablet daily   clonazePAM 0.5 MG tablet Commonly known as:  KLONOPIN Take 0.5 mg by mouth 2 (two) times daily as needed for anxiety.   coal tar-salicylic acid 2 % shampoo Reported on 05/23/2016   fluorouracil 5 % cream Commonly known as:  EFUDEX Reported on 05/23/2016   meloxicam 15 MG tablet Commonly known as:  MOBIC Take 1 tablet (15 mg total) by mouth daily.   metoprolol tartrate 50 MG tablet Commonly known as:  LOPRESSOR Take 50 mg by mouth.   metroNIDAZOLE 500 MG tablet Commonly known as:  FLAGYL Take 1 tablet (500 mg total) by mouth every 8 (eight) hours.   mycophenolate 250 MG capsule Commonly known as:  CELLCEPT Take 750 mg by mouth.   omega-3 acid ethyl esters 1 g capsule Commonly known as:  LOVAZA Take 1 g by mouth 2 (two) times daily.   omeprazole 20 MG capsule Commonly known as:  PRILOSEC Take 20 mg by mouth daily. Reported on 05/23/2016   oxyCODONE 10 mg 12 hr tablet Commonly known as:  OXYCONTIN Take 1 tablet (10 mg total) by mouth every 12 (twelve) hours.   oxyCODONE-acetaminophen 5-325 MG tablet Commonly known as:  PERCOCET/ROXICET Take 1-2 tablets by mouth every 4 (four) hours as needed for moderate pain or severe pain.   ranitidine 150 MG tablet Commonly known as:  ZANTAC Take 150 mg by  mouth.   ranitidine 150 MG tablet Commonly known as:  ZANTAC Take 150 mg by mouth.   sildenafil 100 MG tablet Commonly known as:  VIAGRA TAKE 1 TABLET BY MOUTH EVERY DAY AS NEEDED FOR ERECTILE DYSFUNCTION   tacrolimus 1 MG capsule Commonly known as:  PROGRAF Take 4 mg by mouth 2 (two) times daily.   testosterone cypionate 200 MG/ML injection Commonly known as:  DEPOTESTOSTERONE CYPIONATE Inject 2.5 mLs (500 mg total) into the muscle every 14 (fourteen) days.   urea 40 % Crea Commonly known as:  CARMOL Apply nightly to heels and occlude as discussed.       Allergies:  Allergies  Allergen Reactions  . Hydralazine   . Morphine And Related   . Prednisone     hallucinations  . Shellfish Allergy Nausea And Vomiting    Family History: Family History  Problem Relation Age of Onset  . Colon cancer Father 72  . Heart attack Father   . Stroke Brother   . Colon cancer Maternal Grandfather   . Kidney disease Neg Hx   . Prostate cancer Neg Hx   . Kidney cancer Neg Hx   . Bladder Cancer Neg Hx     Social History:  reports that he has never smoked. He has never used smokeless tobacco. He reports that he drinks alcohol. He reports that  he does not use drugs.  ROS: UROLOGY Frequent Urination?: No Hard to postpone urination?: No Burning/pain with urination?: No Get up at night to urinate?: No Leakage of urine?: No Urine stream starts and stops?: No Trouble starting stream?: No Do you have to strain to urinate?: No Blood in urine?: No Urinary tract infection?: No Sexually transmitted disease?: No Injury to kidneys or bladder?: No Painful intercourse?: No Weak stream?: No Erection problems?: Yes Penile pain?: No  Gastrointestinal Nausea?: No Vomiting?: No Indigestion/heartburn?: Yes Diarrhea?: No Constipation?: No  Constitutional Fever: No Night sweats?: No Weight loss?: No Fatigue?: No  Skin Skin rash/lesions?: No Itching?: No  Eyes Blurred vision?:  No Double vision?: No  Ears/Nose/Throat Sore throat?: No Sinus problems?: No  Hematologic/Lymphatic Swollen glands?: No Easy bruising?: No  Cardiovascular Leg swelling?: No Chest pain?: No  Respiratory Cough?: No Shortness of breath?: No  Endocrine Excessive thirst?: No  Musculoskeletal Back pain?: Yes Joint pain?: No  Neurological Headaches?: No Dizziness?: No  Psychologic Depression?: No Anxiety?: No  Physical Exam: BP (!) 165/93   Pulse 60   Ht 5\' 10"  (1.778 m)   Wt 281 lb (127.5 kg)   BMI 40.32 kg/m   Constitutional: Well nourished. Alert and oriented, No acute distress. HEENT: [place] AT, moist mucus membranes. Trachea midline, no masses. Cardiovascular: No clubbing, cyanosis, or edema. Respiratory: Normal respiratory effort, no increased work of breathing. GI: Abdomen is soft, non tender, non distended, no abdominal masses. Liver and spleen not palpable.  No hernias appreciated.  Stool sample for occult testing is not indicated.   GU: No CVA tenderness.  No bladder fullness or masses.  Patient with circumcised phallus.   Urethral meatus is patent.  No penile discharge. No penile lesions or rashes. Scrotum without lesions, cysts, rashes and/or edema.  Testicles are located scrotally bilaterally. No masses are appreciated in the testicles. Left and right epididymis are normal. Rectal: Patient with  normal sphincter tone. Anus and perineum without scarring or rashes. No rectal masses are appreciated. Prostate is approximately 35 grams, no nodules are appreciated. Seminal vesicles are normal. Skin: No rashes, bruises or suspicious lesions. Lymph: No cervical or inguinal adenopathy. Neurologic: Grossly intact, no focal deficits, moving all 4 extremities. Psychiatric: Normal mood and affect.  Laboratory Data: PSA History  0.9 ng/mL on 07/10/2015  0.9 ng/mL on 11/28/2015  0.7 ng/mL on 05/23/2016  0.6 ng/mL on 04/09/2017  0.8 ng/mL on 10/06/2017   Lab Results   Component Value Date   TESTOSTERONE 80 (L) 10/06/2017   I have reviewed the labs.  Assessment & Plan:    1. History of renal transplant  - followed by [facility]  2. Testosterone deficiency    -most recent testosterone level is 80 ng/dL on 10/06/2017 (therapeutic level 450-600 ng/dL)  -discontinue Clomid 50 mg and restart testosterone cypionate 200 mg IM, 1 cc every two weeks  - recheck testosterone level after his 4th injection  -RTC in 6 months for HCT, testosterone, PSA, ADAM and exam  3. BPH with LUTS  - IPSS score is 6/1, it is improving  - Continue conservative management, avoiding bladder irritants and timed voiding's  - RTC in 6 months for IPSS, PSA and exam, as testosterone therapy can cause prostate enlargement and worsen LUTS  4. Erectile dysfunction:     -SHIM score is 14, it is improving  -continue PDE5-inhibitors; refilled generic sildenafil   -RTC in 6 months for SHIM score and exam, as testosterone therapy can affect erections  Return in about 6 months (around 04/07/2018) for ADAM, IPSS, SHIM and exam, PSA, HCT, HBG, testosterone (one week after injection).  These notes generated with voice recognition software. I apologize for typographical errors.  Zara Council, [facility] [address] [place], [place] [address] [phone]

## 2017-10-07 ENCOUNTER — Ambulatory Visit (INDEPENDENT_AMBULATORY_CARE_PROVIDER_SITE_OTHER): Payer: 59 | Admitting: Urology

## 2017-10-07 ENCOUNTER — Encounter: Payer: Self-pay | Admitting: Urology

## 2017-10-07 VITALS — BP 165/93 | HR 60 | Ht 70.0 in | Wt 281.0 lb

## 2017-10-07 DIAGNOSIS — E349 Endocrine disorder, unspecified: Secondary | ICD-10-CM | POA: Diagnosis not present

## 2017-10-07 DIAGNOSIS — Z940 Kidney transplant status: Secondary | ICD-10-CM

## 2017-10-07 DIAGNOSIS — N401 Enlarged prostate with lower urinary tract symptoms: Secondary | ICD-10-CM

## 2017-10-07 DIAGNOSIS — N529 Male erectile dysfunction, unspecified: Secondary | ICD-10-CM | POA: Diagnosis not present

## 2017-10-07 DIAGNOSIS — N138 Other obstructive and reflux uropathy: Secondary | ICD-10-CM | POA: Diagnosis not present

## 2017-10-07 LAB — HEPATIC FUNCTION PANEL
ALBUMIN: 4.4 g/dL (ref 3.5–5.5)
ALT: 19 IU/L (ref 0–44)
AST: 13 IU/L (ref 0–40)
Alkaline Phosphatase: 63 IU/L (ref 39–117)
Bilirubin Total: 0.5 mg/dL (ref 0.0–1.2)
Bilirubin, Direct: 0.13 mg/dL (ref 0.00–0.40)
TOTAL PROTEIN: 6.9 g/dL (ref 6.0–8.5)

## 2017-10-07 LAB — PSA: PROSTATE SPECIFIC AG, SERUM: 0.8 ng/mL (ref 0.0–4.0)

## 2017-10-07 LAB — TESTOSTERONE: Testosterone: 80 ng/dL — ABNORMAL LOW (ref 264–916)

## 2017-10-07 LAB — HEMATOCRIT: HEMATOCRIT: 47.8 % (ref 37.5–51.0)

## 2017-10-07 LAB — HEMOGLOBIN: HEMOGLOBIN: 15.7 g/dL (ref 13.0–17.7)

## 2017-10-07 MED ORDER — TESTOSTERONE CYPIONATE 200 MG/ML IM SOLN
200.0000 mg | Freq: Once | INTRAMUSCULAR | Status: AC
  Administered 2017-10-07: 200 mg via INTRAMUSCULAR

## 2017-10-07 MED ORDER — SILDENAFIL CITRATE 100 MG PO TABS: ORAL_TABLET | ORAL | 11 refills | Status: DC

## 2017-10-07 NOTE — Addendum Note (Signed)
Addended by: Orlene Erm on: 10/07/2017 11:52 AM   Modules accepted: Orders

## 2017-10-07 NOTE — Progress Notes (Signed)
Testosterone IM Injection  Due to Hypogonadism patient is present today for a Testosterone Injection.  Medication: Testosterone Cypionate Dose: 1 ml Location: left upper outer buttocks Lot: [id number] Exp:09/2018  Patient tolerated well, no complications were noted  Preformed by: Lyndee Hensen CMA  Follow up: 2 weeks

## 2017-10-14 ENCOUNTER — Other Ambulatory Visit: Payer: Self-pay | Admitting: Podiatry

## 2017-10-14 NOTE — Telephone Encounter (Signed)
Pt needs an appt prior to future refills. 

## 2017-10-20 ENCOUNTER — Ambulatory Visit (INDEPENDENT_AMBULATORY_CARE_PROVIDER_SITE_OTHER): Payer: 59 | Admitting: Podiatry

## 2017-10-20 ENCOUNTER — Encounter: Payer: Self-pay | Admitting: Podiatry

## 2017-10-20 DIAGNOSIS — M2020 Hallux rigidus, unspecified foot: Secondary | ICD-10-CM

## 2017-10-20 DIAGNOSIS — M205X9 Other deformities of toe(s) (acquired), unspecified foot: Secondary | ICD-10-CM

## 2017-10-20 DIAGNOSIS — M722 Plantar fascial fibromatosis: Secondary | ICD-10-CM | POA: Diagnosis not present

## 2017-10-20 NOTE — Progress Notes (Signed)
This patient returns to the office for continued evaluation of his left heel  . He has been diagnosed with plantar fasciitis and was treated with power step insoles and an injection therapy.  This patient states he is much better and is very pleased with his progress.  He says he still experiences pain upon rising in the morning but this quickly resolved and he becomes pain-free.  He does admit that he is 60% better from the initial exam. He presents the office today for continued evaluation and treatment of his left heel pain  General Appearance  Alert, conversant and in no acute stress.  Vascular  Dorsalis pedis and posterior pulses are palpable  bilaterally.  Capillary return is within normal limits  Bilaterally. Temperature is within normal limits  Bilaterally  Neurologic  Senn-Weinstein monofilament wire test within normal limits  bilaterally. Muscle power  Within normal limits bilaterally.  Nails Thick disfigured discolored nails with subungual debride bilaterally from hallux to fifth toes bilaterally. No evidence of bacterial infection or drainage bilaterally.  Orthopedic  No limitations of motion of motion feet bilaterally.  No crepitus or effusions noted.  No bony pathology or digital deformities noted. Hallux limitus 1st MPJ  B/L  Palpable pain at the insertion of plantar fascia is minimal.  Skin  normotropic skin with no porokeratosis noted bilaterally.  No signs of infections or ulcers noted.  .. \  Plantar fascitis secondary hallux limitus   B/L  ROV  patient has improved and I'm very pleased with his progress.  I told this patient that he is a candidate for orthoses which would include a kinetic wedge in the forefoot.  He is to check with the front desk about his insurance.  He was told to make an appointment with thoracic for casting for orthoses.  RTRC prn   Gardiner Barefoot DPM

## 2017-10-21 ENCOUNTER — Ambulatory Visit (INDEPENDENT_AMBULATORY_CARE_PROVIDER_SITE_OTHER): Payer: 59

## 2017-10-21 DIAGNOSIS — E291 Testicular hypofunction: Secondary | ICD-10-CM | POA: Diagnosis not present

## 2017-10-21 MED ORDER — TESTOSTERONE CYPIONATE 200 MG/ML IM SOLN
200.0000 mg | Freq: Once | INTRAMUSCULAR | Status: AC
  Administered 2017-10-21: 200 mg via INTRAMUSCULAR

## 2017-10-21 NOTE — Progress Notes (Signed)
Testosterone IM Injection  Due to Hypogonadism patient is present today for a Testosterone Injection.  Medication: Testosterone Cypionate Dose: 52mL Location: right upper outer buttocks Lot: [id number] Exp:09/2018  Patient tolerated well, no complications were noted  Preformed by: Toniann Fail, LPN   Follow up: 2 weeks

## 2017-11-04 ENCOUNTER — Ambulatory Visit (INDEPENDENT_AMBULATORY_CARE_PROVIDER_SITE_OTHER): Payer: 59

## 2017-11-04 DIAGNOSIS — E291 Testicular hypofunction: Secondary | ICD-10-CM | POA: Diagnosis not present

## 2017-11-04 MED ORDER — TESTOSTERONE CYPIONATE 200 MG/ML IM SOLN
200.0000 mg | Freq: Once | INTRAMUSCULAR | Status: AC
  Administered 2017-11-04: 200 mg via INTRAMUSCULAR

## 2017-11-04 NOTE — Progress Notes (Signed)
Testosterone IM Injection  Due to Hypogonadism patient is present today for a Testosterone Injection.  Medication: Testosterone Cypionate Dose: 73mL Location: left upper outer buttocks Lot: [id number] Exp:09/2018  Patient tolerated well, no complications were noted  Preformed by: Toniann Fail, LPN  Follow up: Pt will have injection in 2 weeks and labs in 3 weeks

## 2017-11-10 ENCOUNTER — Other Ambulatory Visit: Payer: Self-pay | Admitting: Podiatry

## 2017-11-10 NOTE — Telephone Encounter (Signed)
Pt needs an appt prior to future refills. 

## 2017-11-18 ENCOUNTER — Ambulatory Visit (INDEPENDENT_AMBULATORY_CARE_PROVIDER_SITE_OTHER): Payer: 59

## 2017-11-18 DIAGNOSIS — E291 Testicular hypofunction: Secondary | ICD-10-CM | POA: Diagnosis not present

## 2017-11-18 MED ORDER — TESTOSTERONE CYPIONATE 200 MG/ML IM SOLN
200.0000 mg | Freq: Once | INTRAMUSCULAR | Status: AC
  Administered 2017-11-18: 200 mg via INTRAMUSCULAR

## 2017-11-18 NOTE — Progress Notes (Signed)
Testosterone IM Injection  Due to Hypogonadism patient is present today for a Testosterone Injection.  Medication: Testosterone Cypionate Dose: 55mL Location: right upper outer buttocks Lot: [id number] Exp:09/2018  Patient tolerated well, no complications were noted  Preformed by: Toniann Fail, LPN   Follow up: Pt will have labs next week. If labs are stable per Larene Beach pt can administer injections at home. Pt will need a refill of medication sent to [facility].

## 2017-11-24 ENCOUNTER — Other Ambulatory Visit: Payer: Self-pay

## 2017-11-24 DIAGNOSIS — E291 Testicular hypofunction: Secondary | ICD-10-CM

## 2017-11-24 DIAGNOSIS — N401 Enlarged prostate with lower urinary tract symptoms: Secondary | ICD-10-CM

## 2017-11-25 ENCOUNTER — Encounter: Payer: Self-pay | Admitting: Urology

## 2017-11-25 ENCOUNTER — Other Ambulatory Visit: Payer: 59

## 2017-11-26 ENCOUNTER — Other Ambulatory Visit: Payer: 59

## 2017-11-26 DIAGNOSIS — N401 Enlarged prostate with lower urinary tract symptoms: Secondary | ICD-10-CM

## 2017-11-26 DIAGNOSIS — E291 Testicular hypofunction: Secondary | ICD-10-CM

## 2017-11-27 LAB — TESTOSTERONE: Testosterone: 625 ng/dL (ref 264–916)

## 2017-11-27 LAB — HEMATOCRIT: Hematocrit: 47.7 % (ref 37.5–51.0)

## 2017-11-27 LAB — PSA: Prostate Specific Ag, Serum: 1 ng/mL (ref 0.0–4.0)

## 2017-11-27 LAB — HEMOGLOBIN: Hemoglobin: 15.6 g/dL (ref 13.0–17.7)

## 2017-12-02 ENCOUNTER — Ambulatory Visit: Payer: 59

## 2017-12-02 ENCOUNTER — Telehealth: Payer: Self-pay

## 2017-12-02 ENCOUNTER — Other Ambulatory Visit: Payer: Self-pay | Admitting: Urology

## 2017-12-02 DIAGNOSIS — E291 Testicular hypofunction: Secondary | ICD-10-CM

## 2017-12-02 MED ORDER — TESTOSTERONE CYPIONATE 200 MG/ML IM SOLN: 500.0000 mg | INTRAMUSCULAR | 0 refills | Status: DC

## 2017-12-02 NOTE — Telephone Encounter (Signed)
Pt came in today and made aware of lab results and self administration. Pt voiced understanding.

## 2017-12-02 NOTE — Progress Notes (Signed)
Pt presented today for an injection. Pt was out of medication therefore a script was printed and pt was advised can self administer at home. Made pt aware will need labs in 41mo and OV in 64mo. Pt voiced understanding of whole conversation.

## 2017-12-02 NOTE — Telephone Encounter (Signed)
-----   Message from Nori Riis, PA-C sent at 11/27/2017  7:57 AM EST ----- Please let Mr. Kouba know that his labs are normal.  He may start self administering his testosterone.

## 2017-12-13 ENCOUNTER — Other Ambulatory Visit: Payer: Self-pay | Admitting: Podiatry

## 2017-12-31 ENCOUNTER — Other Ambulatory Visit: Payer: Self-pay | Admitting: Urology

## 2017-12-31 DIAGNOSIS — E291 Testicular hypofunction: Secondary | ICD-10-CM

## 2018-03-24 ENCOUNTER — Other Ambulatory Visit: Payer: Self-pay | Admitting: Urology

## 2018-03-24 DIAGNOSIS — E291 Testicular hypofunction: Secondary | ICD-10-CM

## 2018-04-02 ENCOUNTER — Other Ambulatory Visit: Payer: Self-pay | Admitting: Urology

## 2018-04-02 DIAGNOSIS — E291 Testicular hypofunction: Secondary | ICD-10-CM

## 2018-04-03 NOTE — Progress Notes (Deleted)
8:05 AM   Chad Mccann Sep 28, 1966 [id number]  Referring provider: [facility], [place] [facility] [place] [address], [place] [address]  No chief complaint on file.   HPI: Patient is a 52 year old Caucasian male with a history of a renal transplant, erectile dysfunction, testosterone deficiency and BPH with LUTS who presents today for his 6 months recheck.  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  His recent serum creatinine was 1.05 on 03/28/2017.Marland Kitchen Renal ultrasound performed on 12/05/2015 noted native kidneys atrophic with cortical thinning an increased echotexture. No hydronephrosis. No hydronephrosis within the transplanted kidney. He is not experiencing back pain at this time.  Followed at [facility].   Erectile dysfunction His SHIM score is ***, which is *** erectile dysfunction.   His previous SHIM score was 14.  He has been having difficulty with erections for the last several years.   His major complaint is maintaining an erections.  His libido is preserved.   His risk factors for ED are hyperlipidemia, HTN, testosterone deficiency and age .  He denies any painful erections or curvatures with his erections.   He has tried PDE5-inhibitors in the past with good results.      Score: 1-7 Severe ED 8-11 Moderate ED 12-16 Mild-Moderate ED 17-21 Mild ED 22-25 No ED  Testosterone deficiency Patient is experiencing a lack of energy, a decrease in strength and/or endurance, Erections being less strong and a recent deterioration in his ability to play sports. This is indicated by his responses to the ADAM questionnaire.  His pretreatment testosterone level was 116 ng/dL on 05/14/2012.  He is currently managing his hypogonadism with testosterone cypionate 200 mg/cc,  1 cc IM q 3 weeks.  He is not happy with this treatment regimen.   He is not having spontaneous erections.  He does sleep with a CPAP machine.    His last testosterone was 625 ng/dL on 11/26/2017.  HCT and HBG are normal.     BPH WITH LUTS His IPSS score today is ***, which is *** lower urinary tract symptomatology.  He is *** with his quality life due to his urinary symptoms.   His major complaint is nocturia  2   His previous I PSS score was 6/1.  He denies any dysuria, hematuria or suprapubic pain.  He also denies any recent fevers, chills, nausea or vomiting.  He does not have a family history of PCa.   Score:  1-7 Mild 8-19 Moderate 20-35 Severe   PMH: Past Medical History:  Diagnosis Date  . Benign prostatic hypertrophy   . Bladder spasm   . Erectile dysfunction   . Frequency   . Hypertension   . Hypogonadism in male   . Kidney 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   . Kidney, malrotation   . MGUS (monoclonal gammopathy of unknown significance)   . Monoclonal paraproteinemia   . Obesity, unspecified   . Other specified congenital anomaly of kidney   . Other specified disorders of bladder   . Other testicular hypofunction   . Premature ejaculation   . Sleep apnea   . Ventral hernia     Surgical History: Past Surgical History:  Procedure Laterality Date  . COLONOSCOPY     polyp removal  . dialysis port placement    . HERNIA REPAIR  12/29/13   ventral  . KIDNEY TRANSPLANT  01/2012  . SPINE SURGERY     cervical fusion    Home Medications:  Allergies as of 04/06/2018      Reactions   Hydralazine    Morphine And Related    Prednisone    hallucinations   Shellfish Allergy Nausea And Vomiting      Medication List        Accurate as of 04/03/18  8:05 AM. Always use your most recent med list.          amLODipine 5 MG tablet Commonly known as:  NORVASC Take 10 mg by mouth.   amLODipine 5 MG tablet Commonly known as:  NORVASC Take 5 mg by mouth.   aspirin 81 MG tablet Take 81 mg by mouth daily.   atorvastatin 10 MG  tablet Commonly known as:  LIPITOR Take 10 mg by mouth daily.   CIALIS 20 MG tablet Generic drug:  tadalafil TAKE ONE TABLET ONE HOUR PRIOR TO SEXUALACTIVITY   ciprofloxacin 500 MG tablet Commonly known as:  CIPRO Take 1 tablet (500 mg total) by mouth 2 (two) times daily.   clomiPHENE 50 MG tablet Commonly known as:  CLOMID Take 1/2 tablet daily   clonazePAM 0.5 MG tablet Commonly known as:  KLONOPIN Take 0.5 mg by mouth 2 (two) times daily as needed for anxiety.   coal tar-salicylic acid 2 % shampoo Reported on 05/23/2016   fluorouracil 5 % cream Commonly known as:  EFUDEX Reported on 05/23/2016   meloxicam 15 MG tablet Commonly known as:  MOBIC TAKE 1 TABLET BY MOUTH EVERY DAY   metoprolol tartrate 50 MG tablet Commonly known as:  LOPRESSOR Take 50 mg by mouth.   metroNIDAZOLE 500 MG tablet Commonly known as:  FLAGYL Take 1 tablet (500 mg total) by mouth every 8 (eight) hours.   mycophenolate 250 MG capsule Commonly known as:  CELLCEPT Take 750 mg by mouth.   omega-3 acid ethyl esters 1 g capsule Commonly known as:  LOVAZA Take 1 g by mouth 2 (two) times daily.   omeprazole 20 MG capsule Commonly known as:  PRILOSEC Take 20 mg by mouth daily. Reported on 05/23/2016   oxyCODONE 10 mg 12 hr tablet Commonly known as:  OXYCONTIN Take 1 tablet (10 mg total) by mouth every 12 (twelve) hours.   oxyCODONE-acetaminophen 5-325 MG tablet Commonly known as:  PERCOCET/ROXICET Take 1-2 tablets by mouth every 4 (four) hours as needed for moderate pain or severe pain.   ranitidine 150 MG tablet Commonly known as:  ZANTAC Take 150 mg by mouth.   ranitidine 150 MG tablet Commonly known as:  ZANTAC Take 150 mg by mouth.   sildenafil 100 MG tablet Commonly known as:  VIAGRA TAKE 1 TABLET BY MOUTH EVERY DAY AS NEEDED FOR ERECTILE DYSFUNCTION   tacrolimus 1 MG capsule Commonly known as:  PROGRAF Take 4 mg by mouth 2 (two) times daily.   testosterone cypionate 200  MG/ML injection Commonly known as:  DEPOTESTOSTERONE CYPIONATE INJECT 2.5 ML INTO THE MUSCLE EVERY 14 DAYS   urea 40 % Crea Commonly known as:  CARMOL Apply nightly to heels and occlude as discussed.       Allergies:  Allergies  Allergen Reactions  . Hydralazine   . Morphine And Related   . Prednisone     hallucinations  . Shellfish Allergy Nausea And Vomiting    Family History: Family History  Problem Relation Age of Onset  . Colon cancer Father 53  . Heart attack  Father   . Stroke Brother   . Colon cancer Maternal Grandfather   . Kidney disease Neg Hx   . Prostate cancer Neg Hx   . Kidney cancer Neg Hx   . Bladder Cancer Neg Hx     Social History:  reports that he has never smoked. He has never used smokeless tobacco. He reports that he drinks alcohol. He reports that he does not use drugs.  ROS:                                        Physical Exam: There were no vitals taken for this visit.  Constitutional: Well nourished. Alert and oriented, No acute distress. HEENT: [place] AT, moist mucus membranes. Trachea midline, no masses. Cardiovascular: No clubbing, cyanosis, or edema. Respiratory: Normal respiratory effort, no increased work of breathing. GI: Abdomen is soft, non tender, non distended, no abdominal masses. Liver and spleen not palpable.  No hernias appreciated.  Stool sample for occult testing is not indicated.   GU: No CVA tenderness.  No bladder fullness or masses.  Patient with circumcised phallus.   Urethral meatus is patent.  No penile discharge. No penile lesions or rashes. Scrotum without lesions, cysts, rashes and/or edema.  Testicles are located scrotally bilaterally. No masses are appreciated in the testicles. Left and right epididymis are normal. Rectal: Patient with  normal sphincter tone. Anus and perineum without scarring or rashes. No rectal masses are appreciated. Prostate is approximately 35 grams, no nodules are  appreciated. Seminal vesicles are normal. Skin: No rashes, bruises or suspicious lesions. Lymph: No cervical or inguinal adenopathy. Neurologic: Grossly intact, no focal deficits, moving all 4 extremities. Psychiatric: Normal mood and affect.   Laboratory Data: PSA History  0.9 ng/mL on 07/10/2015  0.9 ng/mL on 11/28/2015  0.7 ng/mL on 05/23/2016  0.6 ng/mL on 04/09/2017  0.8 ng/mL on 10/06/2017  1.0 ng/mL on 11/26/2017   Lab Results  Component Value Date   TESTOSTERONE 625 11/26/2017   I have reviewed the labs.  Assessment & Plan:    1. History of renal transplant  - followed by [facility]  2. Testosterone deficiency    -most recent testosterone level is 625 ng/dL on 11/26/2017 (therapeutic level 450-600 ng/dL)  -continue testosterone cypionate 200 mg IM, 1 cc every two weeks  -RTC in 6 months for HCT, testosterone, PSA and exam  3. BPH with LUTS  - IPSS score is 6/1, it is improving  - Continue conservative management, avoiding bladder irritants and timed voiding's  - RTC in 6 months for IPSS, PSA and exam, as testosterone therapy can cause prostate enlargement and worsen LUTS  4. Erectile dysfunction:     -SHIM score is 14, it is improving  -continue PDE5-inhibitors; refilled generic sildenafil   -RTC in 6 months for SHIM score and exam, as testosterone therapy can affect erections   No follow-ups on file.  These notes generated with voice recognition software. I apologize for typographical errors.  Zara Council, [facility] [address], [place], [place] [address] [phone]

## 2018-04-06 ENCOUNTER — Ambulatory Visit: Payer: 59 | Admitting: Urology

## 2018-04-27 ENCOUNTER — Other Ambulatory Visit: Payer: 59

## 2018-04-29 ENCOUNTER — Ambulatory Visit: Payer: 59 | Admitting: Urology

## 2018-05-26 ENCOUNTER — Other Ambulatory Visit: Payer: Self-pay | Admitting: Family Medicine

## 2018-05-26 ENCOUNTER — Other Ambulatory Visit: Payer: 59

## 2018-05-26 DIAGNOSIS — N401 Enlarged prostate with lower urinary tract symptoms: Secondary | ICD-10-CM

## 2018-05-26 DIAGNOSIS — E291 Testicular hypofunction: Secondary | ICD-10-CM

## 2018-05-26 DIAGNOSIS — E349 Endocrine disorder, unspecified: Secondary | ICD-10-CM

## 2018-05-27 LAB — PSA: PROSTATE SPECIFIC AG, SERUM: 1.1 ng/mL (ref 0.0–4.0)

## 2018-05-27 LAB — TESTOSTERONE: TESTOSTERONE: 188 ng/dL — AB (ref 264–916)

## 2018-05-27 LAB — HEMOGLOBIN: Hemoglobin: 15.9 g/dL (ref 13.0–17.7)

## 2018-05-27 LAB — HEMATOCRIT: Hematocrit: 48.8 % (ref 37.5–51.0)

## 2018-06-01 NOTE — Progress Notes (Signed)
3:19 PM   Chad Mccann 11-24-1966 [id number]  Referring provider: [facility], [place] [facility] [place] [address] [place], [place] [address]  Chief Complaint  Patient presents with  . Testosterone Deficiency    HPI: Patient is a 52 year old Caucasian male with a history of renal transplant, erectile dysfunction and testosterone deficiency who presents today for six-month follow-up.    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  His recent serum creatinine was 1.05 on 03/28/2017.Marland Kitchen Renal ultrasound performed on 12/05/2015 noted native kidneys atrophic with cortical thinning an increased echotexture. No hydronephrosis. No hydronephrosis within the transplanted kidney. Followed at [facility] and was last seen in 04/2018.    Erectile dysfunction His SHIM score is 16, which is mild to moderate erectile dysfunction.   His previous SHIM score was 14.  He has been having difficulty with erections for the last several years.   His major complaint is maintaining an erections.  His libido is preserved.   His risk factors for ED are hyperlipidemia, HTN, testosterone deficiency and age .  He denies any painful erections or curvatures with his erections.   He has tried PDE5-inhibitors in the past with good results.    SHIM    Row Name 06/02/18 1503         SHIM: Over the last 6 months:   How do you rate your confidence that you could get and keep an erection?  Moderate     When you had erections with sexual stimulation, how often were your erections hard enough for penetration (entering your partner)?  Sometimes (about half the time)     During sexual intercourse, how often were you able to maintain your erection after you had penetrated (entered) your partner?  Sometimes (about half the time)     During sexual intercourse, how difficult was it to  maintain your erection to completion of intercourse?  Slightly Difficult     When you attempted sexual intercourse, how often was it satisfactory for you?  Sometimes (about half the time)       SHIM Total Score   SHIM  16        Score: 1-7 Severe ED 8-11 Moderate ED 12-16 Mild-Moderate ED 17-21 Mild ED 22-25 No ED  Testosterone deficiency His pretreatment testosterone level was 116 ng/dL on 05/14/2012.  He is currently managing his hypogonadism with testosterone cypionate 200 mg/cc,  1 cc IM q 2 weeks.  He is not happy with this treatment regimen.  He is not having spontaneous erections.  He does sleep with a CPAP machine.    His last testosterone was 188 ng/dL on 05/26/2018.  HCT 48.8% and Hbg 15.9 on 05/26/2018.      BPH WITH LUTS He has no urinary complaints at this time.  His previous I PSS score was 6/1.  He denies any dysuria, hematuria or suprapubic pain.  He also denies any recent fevers, chills, nausea or vomiting.  He does not have a family history of PCa.   PMH: Past Medical History:  Diagnosis Date  . Benign prostatic hypertrophy   . Bladder spasm   . Erectile dysfunction   . Frequency   . Hypertension   . Hypogonadism in male   . Kidney 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   . Kidney, malrotation   . MGUS (monoclonal gammopathy of unknown significance)   .  Monoclonal paraproteinemia   . Obesity, unspecified   . Other specified congenital anomaly of kidney   . Other specified disorders of bladder   . Other testicular hypofunction   . Premature ejaculation   . Sleep apnea   . Ventral hernia     Surgical History: Past Surgical History:  Procedure Laterality Date  . COLONOSCOPY     polyp removal  . dialysis port placement    . HERNIA REPAIR  12/29/13   ventral  . KIDNEY TRANSPLANT  01/2012  . SPINE SURGERY     cervical fusion    Home Medications:  Allergies as of 06/02/2018      Reactions   Hydralazine    Morphine And Related    Prednisone    hallucinations    Shellfish Allergy Nausea And Vomiting      Medication List        Accurate as of 06/02/18  3:19 PM. Always use your most recent med list.          amLODipine 5 MG tablet Commonly known as:  NORVASC Take 5 mg by mouth.   aspirin 81 MG tablet Take 81 mg by mouth daily.   atorvastatin 10 MG tablet Commonly known as:  LIPITOR Take 10 mg by mouth daily.   CIALIS 20 MG tablet Generic drug:  tadalafil TAKE ONE TABLET ONE HOUR PRIOR TO SEXUALACTIVITY   clomiPHENE 50 MG tablet Commonly known as:  CLOMID Take 1/2 tablet daily   clonazePAM 0.5 MG tablet Commonly known as:  KLONOPIN Take 0.5 mg by mouth 2 (two) times daily as needed for anxiety.   metoprolol tartrate 50 MG tablet Commonly known as:  LOPRESSOR Take 50 mg by mouth.   mycophenolate 250 MG capsule Commonly known as:  CELLCEPT Take 750 mg by mouth.   omega-3 acid ethyl esters 1 g capsule Commonly known as:  LOVAZA Take 1 g by mouth 2 (two) times daily.   oxyCODONE 10 mg 12 hr tablet Commonly known as:  OXYCONTIN Take 1 tablet (10 mg total) by mouth every 12 (twelve) hours.   oxyCODONE-acetaminophen 5-325 MG tablet Commonly known as:  PERCOCET/ROXICET Take 1-2 tablets by mouth every 4 (four) hours as needed for moderate pain or severe pain.   ranitidine 150 MG tablet Commonly known as:  ZANTAC Take 150 mg by mouth.   sildenafil 100 MG tablet Commonly known as:  VIAGRA TAKE 1 TABLET BY MOUTH EVERY DAY AS NEEDED FOR ERECTILE DYSFUNCTION   tacrolimus 1 MG capsule Commonly known as:  PROGRAF Take 4 mg by mouth 2 (two) times daily.   testosterone cypionate 200 MG/ML injection Commonly known as:  DEPOTESTOSTERONE CYPIONATE Inject 1.5 mL into the muscle every 14 days       Allergies:  Allergies  Allergen Reactions  . Hydralazine   . Morphine And Related   . Prednisone     hallucinations  . Shellfish Allergy Nausea And Vomiting    Family History: Family History  Problem Relation Age of Onset    . Colon cancer Father 20  . Heart attack Father   . Stroke Brother   . Colon cancer Maternal Grandfather   . Kidney disease Neg Hx   . Prostate cancer Neg Hx   . Kidney cancer Neg Hx   . Bladder Cancer Neg Hx     Social History:  reports that he has never smoked. He has never used smokeless tobacco. He reports that he drinks alcohol. He reports that he does  not use drugs.  ROS: UROLOGY Frequent Urination?: No Hard to postpone urination?: No Burning/pain with urination?: No Get up at night to urinate?: No Leakage of urine?: No Urine stream starts and stops?: No Trouble starting stream?: No Do you have to strain to urinate?: No Blood in urine?: No Urinary tract infection?: No Sexually transmitted disease?: No Injury to kidneys or bladder?: No Painful intercourse?: No Weak stream?: No Erection problems?: No Penile pain?: No  Gastrointestinal Nausea?: No Vomiting?: No Indigestion/heartburn?: No Diarrhea?: No Constipation?: No  Constitutional Fever: No Night sweats?: No Weight loss?: No Fatigue?: Yes  Skin Skin rash/lesions?: No Itching?: No  Eyes Blurred vision?: No Double vision?: No  Ears/Nose/Throat Sore throat?: No Sinus problems?: No  Hematologic/Lymphatic Swollen glands?: No Easy bruising?: No  Cardiovascular Leg swelling?: No Chest pain?: No  Respiratory Cough?: No Shortness of breath?: No  Endocrine Excessive thirst?: No  Musculoskeletal Back pain?: Yes Joint pain?: No  Neurological Headaches?: No Dizziness?: No  Psychologic Depression?: No Anxiety?: No  Physical Exam: BP (!) 143/82 (BP Location: Right Arm, Patient Position: Sitting, Cuff Size: Large)   Pulse 76   Ht 5\' 10"  (1.778 m)   Wt 276 lb 14.4 oz (125.6 kg)   BMI 39.73 kg/m   Constitutional: Well nourished. Alert and oriented, No acute distress. HEENT: [place] AT, moist mucus membranes. Trachea midline, no masses. Cardiovascular: No clubbing, cyanosis, or  edema. Respiratory: Normal respiratory effort, no increased work of breathing. GI: Abdomen is soft, non tender, non distended, no abdominal masses. Liver and spleen not palpable.  No hernias appreciated.  Stool sample for occult testing is not indicated.   GU: No CVA tenderness.  No bladder fullness or masses.  Patient with circumcised phallus.  Urethral meatus is patent.  No penile discharge. No penile lesions or rashes. Scrotum without lesions, cysts, rashes and/or edema.  Testicles are located scrotally bilaterally.  They are atrophic.  No masses are appreciated in the testicles. Left and right epididymis are normal. Rectal: Patient with  normal sphincter tone. Anus and perineum without scarring or rashes. No rectal masses are appreciated. Prostate is approximately 35 grams, no nodules are appreciated. Seminal vesicles are normal. Skin: No rashes, bruises or suspicious lesions. Lymph: No cervical or inguinal adenopathy. Neurologic: Grossly intact, no focal deficits, moving all 4 extremities. Psychiatric: Normal mood and affect.   Laboratory Data: PSA History  0.9 ng/mL on 07/10/2015  0.9 ng/mL on 11/28/2015  0.7 ng/mL on 05/23/2016  0.6 ng/mL on 04/09/2017  0.8 ng/mL on 10/06/2017  1.1 ng/mL on 05/26/2018   Lab Results  Component Value Date   TESTOSTERONE 188 (L) 05/26/2018   I have reviewed the labs.  Assessment & Plan:    1. History of renal transplant Followed by nephrology - last seen in 04/2018  2. Testosterone deficiency   Recent testosterone level is 188 ng/dL in June 2019 (therapeutic level 450-600 ng/dL) Increase testosterone cypionate 200 mg IM, to 1.5 cc every two weeks; recheck levels one week after fourth injection  RTC in 3 months for testosterone (one week after injection), HCT and Hbg  3. BPH with LUTS Continue conservative management, avoiding bladder irritants and timed voidings  Return to clinic in 6 months for IPSS score, PSA and exam  RTC in 6 months  for PSA and exam, as testosterone therapy can cause prostate enlargement and worsen LUTS  4. Erectile dysfunction:    SHIM score is 16, it is improved Continue Sildenafil 20 mg, 3 to 5 tablets  two hours prior to intercourse on an empty stomach, # 13; he is warned not to take medications that contain nitrates.  I also advised him of the side effects, such as: headache, flushing, dyspepsia, abnormal vision, nasal congestion, back pain, myalgia, nausea, dizziness, and rash.  RTC in 6 months for SHIM score and exam, as testosterone therapy can affect erections   Return in about 1 month (around 07/02/2018) for testosterone level only .  These notes generated with voice recognition software. I apologize for typographical errors.  Zara Council, PA-C  [facility] [address] [place], [place] [address] [phone]

## 2018-06-02 ENCOUNTER — Ambulatory Visit (INDEPENDENT_AMBULATORY_CARE_PROVIDER_SITE_OTHER): Payer: 59 | Admitting: Urology

## 2018-06-02 ENCOUNTER — Encounter: Payer: Self-pay | Admitting: Urology

## 2018-06-02 VITALS — BP 143/82 | HR 76 | Ht 70.0 in | Wt 276.9 lb

## 2018-06-02 DIAGNOSIS — Z940 Kidney transplant status: Secondary | ICD-10-CM | POA: Diagnosis not present

## 2018-06-02 DIAGNOSIS — N401 Enlarged prostate with lower urinary tract symptoms: Secondary | ICD-10-CM

## 2018-06-02 DIAGNOSIS — N529 Male erectile dysfunction, unspecified: Secondary | ICD-10-CM

## 2018-06-02 DIAGNOSIS — E349 Endocrine disorder, unspecified: Secondary | ICD-10-CM

## 2018-06-02 DIAGNOSIS — E291 Testicular hypofunction: Secondary | ICD-10-CM | POA: Diagnosis not present

## 2018-06-02 MED ORDER — TESTOSTERONE CYPIONATE 200 MG/ML IM SOLN: INTRAMUSCULAR | 0 refills | Status: DC

## 2018-06-02 MED ORDER — SILDENAFIL CITRATE 100 MG PO TABS: ORAL_TABLET | ORAL | 11 refills | Status: DC

## 2018-07-01 ENCOUNTER — Other Ambulatory Visit: Payer: 59

## 2018-08-17 DIAGNOSIS — S86919A Strain of unspecified muscle(s) and tendon(s) at lower leg level, unspecified leg, initial encounter: Secondary | ICD-10-CM | POA: Insufficient documentation

## 2018-08-26 ENCOUNTER — Other Ambulatory Visit: Payer: Self-pay | Admitting: Orthopedic Surgery

## 2018-08-26 DIAGNOSIS — S72122A Displaced fracture of lesser trochanter of left femur, initial encounter for closed fracture: Secondary | ICD-10-CM

## 2018-08-26 DIAGNOSIS — S76912A Strain of unspecified muscles, fascia and tendons at thigh level, left thigh, initial encounter: Secondary | ICD-10-CM

## 2018-08-29 ENCOUNTER — Ambulatory Visit: Payer: 59

## 2018-09-26 ENCOUNTER — Ambulatory Visit
Admission: RE | Admit: 2018-09-26 | Discharge: 2018-09-26 | Disposition: A | Discharge status: Home / Self Care | Payer: 59 | Source: Ambulatory Visit | Attending: Orthopedic Surgery | Admitting: Orthopedic Surgery

## 2018-09-26 DIAGNOSIS — S76912A Strain of unspecified muscles, fascia and tendons at thigh level, left thigh, initial encounter: Secondary | ICD-10-CM | POA: Diagnosis present

## 2018-09-26 DIAGNOSIS — X58XXXA Exposure to other specified factors, initial encounter: Secondary | ICD-10-CM | POA: Diagnosis not present

## 2018-09-26 DIAGNOSIS — M1612 Unilateral primary osteoarthritis, left hip: Secondary | ICD-10-CM | POA: Diagnosis not present

## 2018-09-26 DIAGNOSIS — S72122A Displaced fracture of lesser trochanter of left femur, initial encounter for closed fracture: Secondary | ICD-10-CM

## 2018-10-06 ENCOUNTER — Other Ambulatory Visit: Payer: Self-pay | Admitting: Family Medicine

## 2018-10-06 DIAGNOSIS — E291 Testicular hypofunction: Secondary | ICD-10-CM

## 2018-11-07 ENCOUNTER — Emergency Department
Admission: EM | Admit: 2018-11-07 | Discharge: 2018-11-07 | Disposition: A | Discharge status: Home / Self Care | Payer: 59 | Attending: Emergency Medicine | Admitting: Emergency Medicine

## 2018-11-07 ENCOUNTER — Other Ambulatory Visit: Payer: Self-pay

## 2018-11-07 ENCOUNTER — Emergency Department: Payer: 59

## 2018-11-07 DIAGNOSIS — Y939 Activity, unspecified: Secondary | ICD-10-CM | POA: Insufficient documentation

## 2018-11-07 DIAGNOSIS — Y999 Unspecified external cause status: Secondary | ICD-10-CM | POA: Insufficient documentation

## 2018-11-07 DIAGNOSIS — Z23 Encounter for immunization: Secondary | ICD-10-CM | POA: Diagnosis not present

## 2018-11-07 DIAGNOSIS — S0181XA Laceration without foreign body of other part of head, initial encounter: Secondary | ICD-10-CM | POA: Diagnosis not present

## 2018-11-07 DIAGNOSIS — W500XXA Accidental hit or strike by another person, initial encounter: Secondary | ICD-10-CM | POA: Insufficient documentation

## 2018-11-07 DIAGNOSIS — Y929 Unspecified place or not applicable: Secondary | ICD-10-CM | POA: Insufficient documentation

## 2018-11-07 DIAGNOSIS — S0990XA Unspecified injury of head, initial encounter: Secondary | ICD-10-CM | POA: Diagnosis present

## 2018-11-07 DIAGNOSIS — Z7982 Long term (current) use of aspirin: Secondary | ICD-10-CM | POA: Diagnosis not present

## 2018-11-07 DIAGNOSIS — I10 Essential (primary) hypertension: Secondary | ICD-10-CM | POA: Diagnosis not present

## 2018-11-07 DIAGNOSIS — Z79899 Other long term (current) drug therapy: Secondary | ICD-10-CM | POA: Diagnosis not present

## 2018-11-07 MED ORDER — LIDOCAINE HCL (PF) 1 % IJ SOLN
INTRAMUSCULAR | Status: AC
  Administered 2018-11-07: 5 mL
  Filled 2018-11-07: qty 5

## 2018-11-07 MED ORDER — TETANUS-DIPHTH-ACELL PERTUSSIS 5-2.5-18.5 LF-MCG/0.5 IM SUSP
0.5000 mL | Freq: Once | INTRAMUSCULAR | Status: AC
  Administered 2018-11-07: 0.5 mL via INTRAMUSCULAR
  Filled 2018-11-07: qty 0.5

## 2018-11-07 MED ORDER — LIDOCAINE HCL 1 % IJ SOLN
5.0000 mL | Freq: Once | INTRAMUSCULAR | Status: AC
  Administered 2018-11-07: 5 mL
  Filled 2018-11-07: qty 5

## 2018-11-07 MED ORDER — ACETAMINOPHEN 325 MG PO TABS
650.0000 mg | ORAL_TABLET | Freq: Once | ORAL | Status: AC
  Administered 2018-11-07: 650 mg via ORAL
  Filled 2018-11-07: qty 2
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
## 2018-11-07 NOTE — ED Notes (Signed)
Pt with small lac to left forehead above eyebrow. Pt bleeding controlled at this time.

## 2018-11-17 ENCOUNTER — Other Ambulatory Visit: Payer: Self-pay

## 2018-11-17 ENCOUNTER — Encounter: Payer: Self-pay | Admitting: Urology

## 2018-11-17 ENCOUNTER — Ambulatory Visit (INDEPENDENT_AMBULATORY_CARE_PROVIDER_SITE_OTHER): Payer: 59 | Admitting: Urology

## 2018-11-17 VITALS — BP 151/87 | HR 70 | Ht 70.0 in | Wt 271.0 lb

## 2018-11-17 DIAGNOSIS — Z940 Kidney transplant status: Secondary | ICD-10-CM | POA: Diagnosis not present

## 2018-11-17 DIAGNOSIS — N529 Male erectile dysfunction, unspecified: Secondary | ICD-10-CM | POA: Diagnosis not present

## 2018-11-17 DIAGNOSIS — E291 Testicular hypofunction: Secondary | ICD-10-CM

## 2018-11-17 DIAGNOSIS — N401 Enlarged prostate with lower urinary tract symptoms: Secondary | ICD-10-CM

## 2018-11-17 DIAGNOSIS — E349 Endocrine disorder, unspecified: Secondary | ICD-10-CM | POA: Diagnosis not present

## 2018-11-17 MED ORDER — TESTOSTERONE CYPIONATE 200 MG/ML IM SOLN: INTRAMUSCULAR | 0 refills | Status: DC

## 2018-11-17 MED ORDER — TADALAFIL 20 MG PO TABS: 20.0000 mg | ORAL_TABLET | Freq: Every day | ORAL | 11 refills | Status: DC | PRN

## 2018-11-17 NOTE — Progress Notes (Signed)
2:10 PM   Chad Mccann 1965-12-28 [id number]  Referring provider: [name], MD [address] [place],  [address]  Chief Complaint  Patient presents with  . Hypogonadism    HPI: Patient is a 52 year old Caucasian male with a history of renal transplant, erectile dysfunction and testosterone deficiency who presents today for six-month follow-up.    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  Renal ultrasound performed on 12/05/2015 noted native kidneys atrophic with cortical thinning an increased echotexture. No hydronephrosis. No hydronephrosis within the transplanted kidney. Followed at [facility] and was last seen in 06/2018.  Last creatinine was 1.15 in 04/2018.    Erectile dysfunction His SHIM score is 12, which is mild to moderate erectile dysfunction.   His previous SHIM score was 16.  He has been having difficulty with erections for the last several years.   His major complaint is maintaining an erections.  His libido is preserved.   His risk factors for ED are hyperlipidemia, HTN, testosterone deficiency and age.  He denies any painful erections or curvatures with his erections.  He states that sildenafil is giving him equivocal results.   He would like to try Cialis.   SHIM    Row Name 11/17/18 1354         SHIM: Over the last 6 months:   How do you rate your confidence that you could get and keep an erection?  Low     When you had erections with sexual stimulation, how often were your erections hard enough for penetration (entering your partner)?  Sometimes (about half the time)     During sexual intercourse, how often were you able to maintain your erection after you had penetrated (entered) your partner?  A Few Times (much less than half the time)     During sexual intercourse, how difficult was it to maintain your erection to completion  of intercourse?  Very Difficult     When you attempted sexual intercourse, how often was it satisfactory for you?  Sometimes (about half the time)       SHIM Total Score   SHIM  12        Score: 1-7 Severe ED 8-11 Moderate ED 12-16 Mild-Moderate ED 17-21 Mild ED 22-25 No ED  Testosterone deficiency His pretreatment testosterone level was 116 ng/dL on 05/14/2012.  He is currently managing his testosterone deficiency with testosterone cypionate 200 mg/cc,  1.5 cc IM q 2 weeks.  He is not happy with this treatment regimen.  He is not having spontaneous erections.  He does sleep with a CPAP machine.    He gave his last injection two weeks.  His last testosterone was 188 ng/dL on 05/26/2018.  HCT 48.8% and Hbg 15.9 on 05/26/2018.     BPH WITH LUTS I PSS score is 6/0.  He has no urinary complaints at this time.  His previous I PSS score was 6/1.  He denies any dysuria, hematuria or suprapubic pain.  He also denies any recent fevers, chills, nausea or vomiting.  He does not have a family history of PCa. IPSS    Row Name 11/17/18 1300         International Prostate Symptom Score   How often have you had the sensation of not emptying your bladder?  Less than 1 in 5     How often have  you had to urinate less than every two hours?  Less than 1 in 5 times     How often have you found you stopped and started again several times when you urinated?  Less than 1 in 5 times     How often have you found it difficult to postpone urination?  Less than 1 in 5 times     How often have you had a weak urinary stream?  Not at All     How often have you had to strain to start urination?  Not at All     How many times did you typically get up at night to urinate?  2 Times     Total IPSS Score  6       Quality of Life due to urinary symptoms   If you were to spend the rest of your life with your urinary condition just the way it is now how would you feel about that?  Pleased        PMH: Past Medical  History:  Diagnosis Date  . Benign prostatic hypertrophy   . Bladder spasm   . Erectile dysfunction   . Frequency   . Hypertension   . Hypogonadism in male   . Kidney 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   . Kidney, malrotation   . MGUS (monoclonal gammopathy of unknown significance)   . Monoclonal paraproteinemia   . Obesity, unspecified   . Other specified congenital anomaly of kidney   . Other specified disorders of bladder   . Other testicular hypofunction   . Premature ejaculation   . Sleep apnea   . Ventral hernia     Surgical History: Past Surgical History:  Procedure Laterality Date  . COLONOSCOPY     polyp removal  . dialysis port placement    . HERNIA REPAIR  12/29/13   ventral  . KIDNEY TRANSPLANT  01/2012  . SPINE SURGERY     cervical fusion    Home Medications:  Allergies as of 11/17/2018      Reactions   Hydralazine    Morphine And Related    Prednisone    hallucinations   Shellfish Allergy Nausea And Vomiting      Medication List        Accurate as of 11/17/18  2:10 PM. Always use your most recent med list.          amLODipine 5 MG tablet Commonly known as:  NORVASC Take 5 mg by mouth.   aspirin 81 MG tablet Take 81 mg by mouth daily.   atorvastatin 10 MG tablet Commonly known as:  LIPITOR Take 10 mg by mouth daily.   CIALIS 20 MG tablet Generic drug:  tadalafil TAKE ONE TABLET ONE HOUR PRIOR TO SEXUALACTIVITY   tadalafil 20 MG tablet Commonly known as:  ADCIRCA/CIALIS Take 1 tablet (20 mg total) by mouth daily as needed for erectile dysfunction.   clonazePAM 0.5 MG tablet Commonly known as:  KLONOPIN Take 0.5 mg by mouth 2 (two) times daily as needed for anxiety.   metoprolol tartrate 50 MG tablet Commonly known as:  LOPRESSOR Take 50 mg by mouth.   mycophenolate 250 MG capsule Commonly known as:  CELLCEPT Take 750 mg by mouth.   omega-3 acid ethyl esters 1 g capsule Commonly known as:  LOVAZA Take 1 g by mouth 2 (two) times daily.    ranitidine 150 MG tablet Commonly known as:  ZANTAC Take 150 mg by mouth.   sildenafil 100  MG tablet Commonly known as:  VIAGRA TAKE 1 TABLET BY MOUTH EVERY DAY AS NEEDED FOR ERECTILE DYSFUNCTION   tacrolimus 1 MG capsule Commonly known as:  PROGRAF Take 4 mg by mouth 2 (two) times daily.   testosterone cypionate 200 MG/ML injection Commonly known as:  DEPOTESTOSTERONE CYPIONATE Inject 1.5 mL into the muscle every 14 days       Allergies:  Allergies  Allergen Reactions  . Hydralazine   . Morphine And Related   . Prednisone     hallucinations  . Shellfish Allergy Nausea And Vomiting    Family History: Family History  Problem Relation Age of Onset  . Colon cancer Father 21  . Heart attack Father   . Stroke Brother   . Colon cancer Maternal Grandfather   . Kidney disease Neg Hx   . Prostate cancer Neg Hx   . Kidney cancer Neg Hx   . Bladder Cancer Neg Hx     Social History:  reports that he has never smoked. He has never used smokeless tobacco. He reports that he drinks alcohol. He reports that he does not use drugs.  ROS: UROLOGY Frequent Urination?: No Hard to postpone urination?: No Burning/pain with urination?: No Get up at night to urinate?: No Leakage of urine?: No Urine stream starts and stops?: No Trouble starting stream?: No Do you have to strain to urinate?: No Blood in urine?: No Urinary tract infection?: No Sexually transmitted disease?: No Injury to kidneys or bladder?: No Painful intercourse?: No Weak stream?: No Erection problems?: Yes Penile pain?: No  Gastrointestinal Nausea?: No Vomiting?: No Indigestion/heartburn?: Yes Diarrhea?: No Constipation?: No  Constitutional Fever: No Night sweats?: No Weight loss?: No Fatigue?: Yes  Skin Skin rash/lesions?: No Itching?: No  Eyes Blurred vision?: No Double vision?: No  Ears/Nose/Throat Sore throat?: No Sinus problems?: No  Hematologic/Lymphatic Swollen glands?: No Easy  bruising?: No  Cardiovascular Leg swelling?: No Chest pain?: No  Respiratory Cough?: No Shortness of breath?: No  Endocrine Excessive thirst?: No  Musculoskeletal Back pain?: Yes Joint pain?: No  Neurological Headaches?: No Dizziness?: No  Psychologic Depression?: No Anxiety?: No  Physical Exam: BP (!) 151/87   Pulse 70   Ht 5\' 10"  (1.778 m)   Wt 271 lb (122.9 kg)   BMI 38.88 kg/m   Constitutional: Well nourished. Alert and oriented, No acute distress. HEENT: [place] AT, moist mucus membranes. Trachea midline, no masses. Cardiovascular: No clubbing, cyanosis, or edema. Respiratory: Normal respiratory effort, no increased work of breathing. GI: Abdomen is soft, non tender, non distended, no abdominal masses. Liver and spleen not palpable.  No hernias appreciated.  Stool sample for occult testing is not indicated.   GU: No CVA tenderness.  No bladder fullness or masses.  Patient with circumcised phallus.   Urethral meatus is patent.  No penile discharge. No penile lesions or rashes. Scrotum without lesions, cysts, rashes and/or edema.  Testicles are located scrotally bilaterally. No masses are appreciated in the testicles. Left and right epididymis are normal. Rectal: Patient with  normal sphincter tone. Anus and perineum without scarring or rashes. No rectal masses are appreciated. Prostate is approximately 35 grams, no nodules are appreciated. Seminal vesicles are normal. Skin: No rashes, bruises or suspicious lesions. Lymph: No cervical or inguinal adenopathy. Neurologic: Grossly intact, no focal deficits, moving all 4 extremities. Psychiatric: Normal mood and affect.   Laboratory Data: PSA History  0.9 ng/mL on 07/10/2015  0.9 ng/mL on 11/28/2015  0.7 ng/mL on 05/23/2016  0.6 ng/mL on 04/09/2017  0.8 ng/mL on 10/06/2017  1.1 ng/mL on 05/26/2018   Lab Results  Component Value Date   TESTOSTERONE 188 (L) 05/26/2018   I have reviewed the labs.  Assessment &  Plan:    1. History of renal transplant Followed by nephrology - last seen in 06/2018  2. Testosterone deficiency   Recent testosterone level is 188 ng/dL in June 2019 (therapeutic level 450-600 ng/dL) Currently on testosterone cypionate 200 mg IM, to 1.5 cc every two weeks; will recheck levels one week from today as he has been without testosterone for two weeks Testosterone, hematocrit hemoglobin are drawn today  3. BPH with LUTS Continue conservative management, avoiding bladder irritants and timed voidings  PSA drawn today Return to clinic in 6 months for IPSS score, PSA and exam - if PSA is stable RTC in 6 months for PSA and exam, as testosterone therapy can cause prostate enlargement and worsen LUTS  4. Erectile dysfunction:    SHIM score is 12, it is worsening  We will prescribe tadalafil 20 mg, as needed for intercourse to see if this is more effective RTC in 6 months for SHIM score and exam, as testosterone therapy can affect erections   Return in about 6 months (around 05/18/2019) for IPSS, SHIM and exam, PSA, testosterone (one week after injection) H & H.  These notes generated with voice recognition software. I apologize for typographical errors.  Zara Council, PA-C  [facility] [address] [place], [place] [address] [phone]

## 2018-11-18 LAB — HEMATOCRIT: HEMATOCRIT: 49.3 % (ref 37.5–51.0)

## 2018-11-18 LAB — HEMOGLOBIN: HEMOGLOBIN: 16.8 g/dL (ref 13.0–17.7)

## 2018-11-18 LAB — PSA: Prostate Specific Ag, Serum: 0.7 ng/mL (ref 0.0–4.0)

## 2018-11-18 LAB — TESTOSTERONE: TESTOSTERONE: 91 ng/dL — AB (ref 264–916)

## 2018-11-26 ENCOUNTER — Other Ambulatory Visit: Payer: Self-pay

## 2018-11-26 DIAGNOSIS — E291 Testicular hypofunction: Secondary | ICD-10-CM

## 2018-11-26 DIAGNOSIS — E349 Endocrine disorder, unspecified: Secondary | ICD-10-CM

## 2018-11-26 DIAGNOSIS — N138 Other obstructive and reflux uropathy: Secondary | ICD-10-CM

## 2018-11-26 DIAGNOSIS — N529 Male erectile dysfunction, unspecified: Secondary | ICD-10-CM

## 2018-11-26 DIAGNOSIS — N401 Enlarged prostate with lower urinary tract symptoms: Secondary | ICD-10-CM

## 2018-11-27 ENCOUNTER — Other Ambulatory Visit: Payer: 59

## 2018-11-27 ENCOUNTER — Encounter: Payer: Self-pay | Admitting: Urology
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
## 2019-02-17 ENCOUNTER — Inpatient Hospital Stay: Admission: RE | Admit: 2019-02-17 | Payer: 59 | Source: Ambulatory Visit

## 2019-02-22 ENCOUNTER — Other Ambulatory Visit: Payer: Self-pay

## 2019-02-22 ENCOUNTER — Encounter
Admission: RE | Admit: 2019-02-22 | Discharge: 2019-02-22 | Disposition: A | Discharge status: Home / Self Care | Payer: 59 | Source: Ambulatory Visit | Attending: Orthopedic Surgery | Admitting: Orthopedic Surgery

## 2019-02-22 DIAGNOSIS — I10 Essential (primary) hypertension: Secondary | ICD-10-CM | POA: Diagnosis not present

## 2019-02-22 DIAGNOSIS — R9431 Abnormal electrocardiogram [ECG] [EKG]: Secondary | ICD-10-CM | POA: Insufficient documentation

## 2019-02-22 DIAGNOSIS — Z01818 Encounter for other preprocedural examination: Secondary | ICD-10-CM | POA: Insufficient documentation

## 2019-02-22 HISTORY — DX: Gastro-esophageal reflux disease without esophagitis: K21.9

## 2019-02-22 LAB — BASIC METABOLIC PANEL
ANION GAP: 7 (ref 5–15)
BUN: 15 mg/dL (ref 6–20)
CALCIUM: 9.4 mg/dL (ref 8.9–10.3)
CO2: 26 mmol/L (ref 22–32)
Chloride: 104 mmol/L (ref 98–111)
Creatinine, Ser: 1.07 mg/dL (ref 0.61–1.24)
GFR calc non Af Amer: >60 mL/min (ref >60)
GLUCOSE: 93 mg/dL (ref 70–99)
Potassium: 4 mmol/L (ref 3.5–5.1)
Sodium: 137 mmol/L (ref 135–145)

## 2019-02-22 LAB — SURGICAL PCR SCREEN
MRSA, PCR: NEGATIVE
Staphylococcus aureus: NEGATIVE

## 2019-02-22 LAB — APTT: APTT: 26 s (ref 24–36)

## 2019-02-22 LAB — URINALYSIS, ROUTINE W REFLEX MICROSCOPIC
Bilirubin Urine: NEGATIVE
Glucose, UA: NEGATIVE mg/dL
Hgb urine dipstick: NEGATIVE
Ketones, ur: NEGATIVE mg/dL
LEUKOCYTE UA: NEGATIVE
NITRITE: NEGATIVE
PH: 5 (ref 5.0–8.0)
Protein, ur: NEGATIVE mg/dL
SPECIFIC GRAVITY, URINE: 1.024 (ref 1.005–1.030)

## 2019-02-22 LAB — CBC
HEMATOCRIT: 48.7 % (ref 39.0–52.0)
HEMOGLOBIN: 16.6 g/dL (ref 13.0–17.0)
MCH: 30 pg (ref 26.0–34.0)
MCHC: 34.1 g/dL (ref 30.0–36.0)
MCV: 87.9 fL (ref 80.0–100.0)
NRBC: 0 % (ref 0.0–0.2)
Platelets: 210 10*3/uL (ref 150–400)
RBC: 5.54 MIL/uL (ref 4.22–5.81)
RDW: 13.3 % (ref 11.5–15.5)
WBC: 5.8 10*3/uL (ref 4.0–10.5)

## 2019-02-22 LAB — SEDIMENTATION RATE: SED RATE: 1 mm/h (ref 0–20)

## 2019-02-22 LAB — TYPE AND SCREEN
ABO/RH(D): A POS
ANTIBODY SCREEN: NEGATIVE

## 2019-02-22 LAB — PROTIME-INR
INR: 1 (ref 0.8–1.2)
Prothrombin Time: 13.4 seconds (ref 11.4–15.2)

## 2019-02-22 NOTE — Patient Instructions (Signed)
Your procedure is scheduled on:03/02/19 Report to [facility] To find out your arrival time please call [phone] between 1PM - 3PM on 03/01/19.  Remember: Instructions that are not followed completely may result in serious medical risk,  up to and including death, or upon the discretion of your surgeon and anesthesiologist your  surgery may need to be rescheduled.     _X__ 1. Do not eat food after midnight the night before your procedure.                 No gum chewing or hard candies. You may drink clear liquids up to 2 hours                 before you are scheduled to arrive for your surgery- DO not drink clear                 liquids within 2 hours of the start of your surgery.                 Clear Liquids include:  water, apple juice without pulp, clear carbohydrate                 drink such as Clearfast of Gatorade, Black Coffee or Tea (Do not add                 anything to coffee or tea).  __X__2.  On the morning of surgery brush your teeth with toothpaste and water, you                may rinse your mouth with mouthwash if you wish.  Do not swallow any toothpaste of mouthwash.     _X__ 3.  No Alcohol for 24 hours before or after surgery.   _X__ 4.  Do Not Smoke or use e-cigarettes For 24 Hours Prior to Your Surgery.                 Do not use any chewable tobacco products for at least 6 hours prior to                 surgery.  ____  5.  Bring all medications with you on the day of surgery if instructed.   _X___  6.  Notify your doctor if there is any change in your medical condition      (cold, fever, infections).     Do not wear jewelry, make-up, hairpins, clips or nail polish. Do not wear lotions, powders, or perfumes. You may wear deodorant. Do not shave 48 hours prior to surgery. Men may shave face and neck. Do not bring valuables to the hospital.    [facility] is not responsible for any belongings or  valuables.  Contacts, dentures or bridgework may not be worn into surgery. Leave your suitcase in the car. After surgery it may be brought to your room. For patients admitted to the hospital, discharge time is determined by your treatment team.   Patients discharged the day of surgery will not be allowed to drive home.   Please read over the following fact sheets that you were given:   Surgical Site Infection Prevention / MRSA         _X___ Take these medicines the morning of surgery with A SIP OF WATER:    1. AMLODIPINE  2. METOPROLOL  3. CELLCEPT  4.PROGRAFT  5.  6.  ____ Fleet Enema (as directed)  __X__ Use CHG Soap as directed  ____ Use inhalers on the day of surgery  ____ Stop metformin 2 days prior to surgery    ____ Take 1/2 of usual insulin dose the night before surgery. No insulin the morning          of surgery.   _X___ Stop Coumadin/Plavix/aspirin on    AS IN STRUCTED BY DR [facility]  ____ Stop Anti-inflammatories on    ____ Stop supplements until after surgery.    _X___ Bring C-Pap to the hospital.

## 2019-02-22 NOTE — Pre-Procedure Instructions (Signed)
FAXED TO DR [facility] FOR NEPHROLOGY CLEARANCE NOTE. KIDNEY TRANSPLANT 2013

## 2019-02-23 LAB — URINE CULTURE: Culture: NO GROWTH

## 2019-02-23 NOTE — Pre-Procedure Instructions (Signed)
ekg ok by dr Randa Lynn 02/22/19

## 2019-02-24 NOTE — Pre-Procedure Instructions (Signed)
CLEARANCE LOW RISK BY NEPHROLOGY ON CHART

## 2019-03-01 MED ORDER — DEXTROSE 5 % IV SOLN
3.0000 g | Freq: Once | INTRAVENOUS | Status: AC
  Administered 2019-03-02: 3 g via INTRAVENOUS
  Filled 2019-03-01: qty 3

## 2019-03-02 ENCOUNTER — Inpatient Hospital Stay: Payer: 59 | Admitting: Anesthesiology

## 2019-03-02 ENCOUNTER — Encounter
Admission: RE | Disposition: A | Discharge status: Home Health | Payer: Self-pay | Source: Home / Self Care | Attending: Orthopedic Surgery

## 2019-03-02 ENCOUNTER — Other Ambulatory Visit: Payer: Self-pay

## 2019-03-02 ENCOUNTER — Inpatient Hospital Stay: Payer: 59

## 2019-03-02 ENCOUNTER — Inpatient Hospital Stay
Admission: RE | Admit: 2019-03-02 | Discharge: 2019-03-05 | DRG: 470 | Disposition: A | Discharge status: Home Health | Payer: 59 | Attending: Orthopedic Surgery | Admitting: Orthopedic Surgery

## 2019-03-02 ENCOUNTER — Encounter: Payer: Self-pay | Admitting: *Deleted

## 2019-03-02 DIAGNOSIS — Z888 Allergy status to other drugs, medicaments and biological substances status: Secondary | ICD-10-CM | POA: Diagnosis not present

## 2019-03-02 DIAGNOSIS — D472 Monoclonal gammopathy: Secondary | ICD-10-CM | POA: Diagnosis present

## 2019-03-02 DIAGNOSIS — K219 Gastro-esophageal reflux disease without esophagitis: Secondary | ICD-10-CM | POA: Diagnosis present

## 2019-03-02 DIAGNOSIS — M1612 Unilateral primary osteoarthritis, left hip: Principal | ICD-10-CM | POA: Diagnosis present

## 2019-03-02 DIAGNOSIS — Z885 Allergy status to narcotic agent status: Secondary | ICD-10-CM | POA: Diagnosis not present

## 2019-03-02 DIAGNOSIS — Z96642 Presence of left artificial hip joint: Secondary | ICD-10-CM

## 2019-03-02 DIAGNOSIS — E6609 Other obesity due to excess calories: Secondary | ICD-10-CM | POA: Diagnosis present

## 2019-03-02 DIAGNOSIS — N400 Enlarged prostate without lower urinary tract symptoms: Secondary | ICD-10-CM | POA: Diagnosis present

## 2019-03-02 DIAGNOSIS — G4733 Obstructive sleep apnea (adult) (pediatric): Secondary | ICD-10-CM | POA: Diagnosis present

## 2019-03-02 DIAGNOSIS — Z6839 Body mass index (BMI) 39.0-39.9, adult: Secondary | ICD-10-CM

## 2019-03-02 DIAGNOSIS — N529 Male erectile dysfunction, unspecified: Secondary | ICD-10-CM | POA: Diagnosis present

## 2019-03-02 DIAGNOSIS — Z91013 Allergy to seafood: Secondary | ICD-10-CM

## 2019-03-02 DIAGNOSIS — Z419 Encounter for procedure for purposes other than remedying health state, unspecified: Secondary | ICD-10-CM

## 2019-03-02 DIAGNOSIS — Z940 Kidney transplant status: Secondary | ICD-10-CM | POA: Diagnosis not present

## 2019-03-02 DIAGNOSIS — G8918 Other acute postprocedural pain: Secondary | ICD-10-CM

## 2019-03-02 DIAGNOSIS — I10 Essential (primary) hypertension: Secondary | ICD-10-CM | POA: Diagnosis present

## 2019-03-02 DIAGNOSIS — M25552 Pain in left hip: Secondary | ICD-10-CM | POA: Diagnosis present

## 2019-03-02 HISTORY — PX: TOTAL HIP ARTHROPLASTY: SHX124

## 2019-03-02 LAB — CREATININE, SERUM
Creatinine, Ser: 1.2 mg/dL (ref 0.61–1.24)
GFR calc Af Amer: >60 mL/min
GFR calc non Af Amer: >60 mL/min

## 2019-03-02 LAB — CBC
HCT: 49.8 % (ref 39.0–52.0)
Hemoglobin: 16.5 g/dL (ref 13.0–17.0)
MCH: 30.1 pg (ref 26.0–34.0)
MCHC: 33.1 g/dL (ref 30.0–36.0)
MCV: 90.9 fL (ref 80.0–100.0)
Platelets: 204 K/uL (ref 150–400)
RBC: 5.48 MIL/uL (ref 4.22–5.81)
RDW: 13.5 % (ref 11.5–15.5)
WBC: 9.4 K/uL (ref 4.0–10.5)
nRBC: 0 % (ref 0.0–0.2)

## 2019-03-02 LAB — ABO/RH: ABO/RH(D): A POS

## 2019-03-02 SURGERY — ARTHROPLASTY, HIP, TOTAL, ANTERIOR APPROACH
Anesthesia: Spinal | Laterality: Left

## 2019-03-02 MED ORDER — METOCLOPRAMIDE HCL 10 MG PO TABS: 5.0000 mg | ORAL_TABLET | Freq: Three times a day (TID) | ORAL | Status: DC | PRN

## 2019-03-02 MED ORDER — MIDAZOLAM HCL 2 MG/2ML IJ SOLN
INTRAMUSCULAR | Status: AC
  Filled 2019-03-02: qty 2

## 2019-03-02 MED ORDER — FENTANYL CITRATE (PF) 100 MCG/2ML IJ SOLN: 25.0000 ug | INTRAMUSCULAR | Status: DC | PRN

## 2019-03-02 MED ORDER — SODIUM CHLORIDE 0.9 % IV SOLN
INTRAVENOUS | Status: DC
  Administered 2019-03-02 (×2): via INTRAVENOUS

## 2019-03-02 MED ORDER — SODIUM CHLORIDE FLUSH 0.9 % IV SOLN
INTRAVENOUS | Status: AC
  Filled 2019-03-02: qty 10

## 2019-03-02 MED ORDER — KETOROLAC TROMETHAMINE 15 MG/ML IJ SOLN
15.0000 mg | Freq: Once | INTRAMUSCULAR | Status: AC
  Administered 2019-03-02: 15 mg via INTRAVENOUS
  Filled 2019-03-02: qty 1

## 2019-03-02 MED ORDER — PROPOFOL 500 MG/50ML IV EMUL
INTRAVENOUS | Status: DC | PRN
  Administered 2019-03-02: 50 ug/kg/min via INTRAVENOUS

## 2019-03-02 MED ORDER — PROPOFOL 10 MG/ML IV BOLUS
INTRAVENOUS | Status: AC
  Filled 2019-03-02: qty 20

## 2019-03-02 MED ORDER — ONDANSETRON HCL 4 MG/2ML IJ SOLN: 4.0000 mg | Freq: Once | INTRAMUSCULAR | Status: DC | PRN

## 2019-03-02 MED ORDER — MAGNESIUM HYDROXIDE 400 MG/5ML PO SUSP
30.0000 mL | Freq: Every day | ORAL | Status: DC | PRN
  Administered 2019-03-03: 30 mL via ORAL
  Filled 2019-03-02 (×2): qty 30

## 2019-03-02 MED ORDER — METOCLOPRAMIDE HCL 5 MG/ML IJ SOLN: 5.0000 mg | Freq: Three times a day (TID) | INTRAMUSCULAR | Status: DC | PRN

## 2019-03-02 MED ORDER — ATORVASTATIN CALCIUM 10 MG PO TABS
10.0000 mg | ORAL_TABLET | Freq: Every evening | ORAL | Status: DC
  Administered 2019-03-02 – 2019-03-04 (×3): 10 mg via ORAL
  Filled 2019-03-02 (×3): qty 1

## 2019-03-02 MED ORDER — ENOXAPARIN SODIUM 40 MG/0.4ML ~~LOC~~ SOLN
40.0000 mg | SUBCUTANEOUS | Status: DC
  Administered 2019-03-03 – 2019-03-05 (×3): 40 mg via SUBCUTANEOUS
  Filled 2019-03-02 (×3): qty 0.4

## 2019-03-02 MED ORDER — HYDROMORPHONE HCL 1 MG/ML IJ SOLN
0.5000 mg | INTRAMUSCULAR | Status: DC | PRN
  Administered 2019-03-02 (×2): 1 mg via INTRAVENOUS
  Filled 2019-03-02 (×2): qty 1

## 2019-03-02 MED ORDER — SODIUM CHLORIDE 0.9 % IV SOLN
INTRAVENOUS | Status: DC
  Administered 2019-03-02 – 2019-03-03 (×2): via INTRAVENOUS

## 2019-03-02 MED ORDER — ONDANSETRON HCL 4 MG/2ML IJ SOLN: 4.0000 mg | Freq: Four times a day (QID) | INTRAMUSCULAR | Status: DC | PRN

## 2019-03-02 MED ORDER — MENTHOL 3 MG MT LOZG
1.0000 | LOZENGE | OROMUCOSAL | Status: DC | PRN
  Filled 2019-03-02: qty 9

## 2019-03-02 MED ORDER — TACROLIMUS 1 MG PO CAPS
3.0000 mg | ORAL_CAPSULE | Freq: Every morning | ORAL | Status: DC
  Administered 2019-03-03 – 2019-03-05 (×3): 3 mg via ORAL
  Filled 2019-03-02 (×3): qty 3

## 2019-03-02 MED ORDER — ACETAMINOPHEN 500 MG PO TABS
1000.0000 mg | ORAL_TABLET | Freq: Four times a day (QID) | ORAL | Status: AC
  Administered 2019-03-02 – 2019-03-03 (×4): 1000 mg via ORAL
  Filled 2019-03-02 (×4): qty 2

## 2019-03-02 MED ORDER — MIDAZOLAM HCL 5 MG/5ML IJ SOLN
INTRAMUSCULAR | Status: DC | PRN
  Administered 2019-03-02 (×2): 2 mg via INTRAVENOUS

## 2019-03-02 MED ORDER — ASPIRIN EC 81 MG PO TBEC
81.0000 mg | DELAYED_RELEASE_TABLET | Freq: Every day | ORAL | Status: DC
  Administered 2019-03-03 – 2019-03-04 (×2): 81 mg via ORAL
  Filled 2019-03-02 (×3): qty 1

## 2019-03-02 MED ORDER — MYCOPHENOLATE MOFETIL 250 MG PO CAPS
750.0000 mg | ORAL_CAPSULE | Freq: Two times a day (BID) | ORAL | Status: DC
  Administered 2019-03-02 – 2019-03-05 (×6): 750 mg via ORAL
  Filled 2019-03-02 (×7): qty 3

## 2019-03-02 MED ORDER — OXYCODONE HCL 5 MG PO TABS
10.0000 mg | ORAL_TABLET | ORAL | Status: DC | PRN
  Administered 2019-03-02 – 2019-03-05 (×13): 15 mg via ORAL
  Filled 2019-03-02 (×13): qty 3

## 2019-03-02 MED ORDER — METOPROLOL TARTRATE 50 MG PO TABS
100.0000 mg | ORAL_TABLET | Freq: Two times a day (BID) | ORAL | Status: DC
  Administered 2019-03-02 – 2019-03-05 (×6): 100 mg via ORAL
  Filled 2019-03-02 (×6): qty 2

## 2019-03-02 MED ORDER — FAMOTIDINE 20 MG PO TABS
ORAL_TABLET | ORAL | Status: AC
  Filled 2019-03-02: qty 1

## 2019-03-02 MED ORDER — FAMOTIDINE 20 MG PO TABS
20.0000 mg | ORAL_TABLET | Freq: Once | ORAL | Status: AC
  Administered 2019-03-02: 20 mg via ORAL

## 2019-03-02 MED ORDER — LIDOCAINE HCL (PF) 2 % IJ SOLN
INTRAMUSCULAR | Status: AC
  Filled 2019-03-02: qty 10

## 2019-03-02 MED ORDER — PROPOFOL 500 MG/50ML IV EMUL
INTRAVENOUS | Status: AC
  Filled 2019-03-02: qty 50

## 2019-03-02 MED ORDER — OXYCODONE HCL 5 MG PO TABS
10.0000 mg | ORAL_TABLET | ORAL | Status: DC | PRN
  Administered 2019-03-02 (×2): 15 mg via ORAL
  Filled 2019-03-02 (×2): qty 3

## 2019-03-02 MED ORDER — METHOCARBAMOL 1000 MG/10ML IJ SOLN
500.0000 mg | Freq: Four times a day (QID) | INTRAVENOUS | Status: DC | PRN
  Filled 2019-03-02: qty 5

## 2019-03-02 MED ORDER — ZOLPIDEM TARTRATE 5 MG PO TABS
5.0000 mg | ORAL_TABLET | Freq: Every evening | ORAL | Status: DC | PRN
  Administered 2019-03-03 – 2019-03-04 (×2): 5 mg via ORAL
  Filled 2019-03-02 (×2): qty 1

## 2019-03-02 MED ORDER — BISACODYL 5 MG PO TBEC
5.0000 mg | DELAYED_RELEASE_TABLET | Freq: Every day | ORAL | Status: DC | PRN
  Filled 2019-03-02: qty 1

## 2019-03-02 MED ORDER — BUPIVACAINE HCL (PF) 0.5 % IJ SOLN
INTRAMUSCULAR | Status: DC | PRN
  Administered 2019-03-02: 3 mL via INTRATHECAL

## 2019-03-02 MED ORDER — BUPIVACAINE HCL (PF) 0.5 % IJ SOLN
INTRAMUSCULAR | Status: AC
  Filled 2019-03-02: qty 10

## 2019-03-02 MED ORDER — ACETAMINOPHEN 325 MG PO TABS: 325.0000 mg | ORAL_TABLET | Freq: Four times a day (QID) | ORAL | Status: DC | PRN

## 2019-03-02 MED ORDER — LIDOCAINE HCL URETHRAL/MUCOSAL 2 % EX GEL
CUTANEOUS | Status: AC
  Filled 2019-03-02: qty 5

## 2019-03-02 MED ORDER — CEFAZOLIN SODIUM-DEXTROSE 2-4 GM/100ML-% IV SOLN
2.0000 g | Freq: Four times a day (QID) | INTRAVENOUS | Status: AC
  Administered 2019-03-02 – 2019-03-03 (×3): 2 g via INTRAVENOUS
  Filled 2019-03-02 (×4): qty 100

## 2019-03-02 MED ORDER — CLONAZEPAM 0.5 MG PO TABS
0.5000 mg | ORAL_TABLET | Freq: Every day | ORAL | Status: DC
  Administered 2019-03-02 – 2019-03-04 (×3): 0.5 mg via ORAL
  Filled 2019-03-02 (×3): qty 1

## 2019-03-02 MED ORDER — TACROLIMUS 1 MG PO CAPS
2.0000 mg | ORAL_CAPSULE | Freq: Every day | ORAL | Status: DC
  Administered 2019-03-02 – 2019-03-04 (×3): 2 mg via ORAL
  Filled 2019-03-02 (×4): qty 2

## 2019-03-02 MED ORDER — TRAMADOL HCL 50 MG PO TABS
50.0000 mg | ORAL_TABLET | Freq: Four times a day (QID) | ORAL | Status: DC
  Administered 2019-03-02 – 2019-03-05 (×12): 50 mg via ORAL
  Filled 2019-03-02 (×12): qty 1

## 2019-03-02 MED ORDER — MAGNESIUM CITRATE PO SOLN
1.0000 | Freq: Once | ORAL | Status: AC | PRN
  Administered 2019-03-05: 1 via ORAL
  Filled 2019-03-02 (×2): qty 296

## 2019-03-02 MED ORDER — DOCUSATE SODIUM 100 MG PO CAPS
100.0000 mg | ORAL_CAPSULE | Freq: Two times a day (BID) | ORAL | Status: DC
  Administered 2019-03-02 – 2019-03-05 (×6): 100 mg via ORAL
  Filled 2019-03-02 (×6): qty 1

## 2019-03-02 MED ORDER — DIPHENHYDRAMINE HCL 12.5 MG/5ML PO ELIX: 12.5000 mg | ORAL_SOLUTION | ORAL | Status: DC | PRN

## 2019-03-02 MED ORDER — EPHEDRINE SULFATE 50 MG/ML IJ SOLN
INTRAMUSCULAR | Status: AC
  Filled 2019-03-02: qty 1

## 2019-03-02 MED ORDER — TRANEXAMIC ACID-NACL 1000-0.7 MG/100ML-% IV SOLN
INTRAVENOUS | Status: DC | PRN
  Administered 2019-03-02: 1000 mg via INTRAVENOUS

## 2019-03-02 MED ORDER — TRANEXAMIC ACID 1000 MG/10ML IV SOLN
INTRAVENOUS | Status: AC
  Filled 2019-03-02: qty 10

## 2019-03-02 MED ORDER — SODIUM CHLORIDE 0.9 % IV SOLN
INTRAVENOUS | Status: DC | PRN
  Administered 2019-03-02: 20 ug/min via INTRAVENOUS

## 2019-03-02 MED ORDER — FENTANYL CITRATE (PF) 100 MCG/2ML IJ SOLN
INTRAMUSCULAR | Status: DC | PRN
  Administered 2019-03-02: 100 ug via INTRAVENOUS

## 2019-03-02 MED ORDER — GLYCOPYRROLATE 0.2 MG/ML IJ SOLN
INTRAMUSCULAR | Status: DC | PRN
  Administered 2019-03-02: 0.2 mg via INTRAVENOUS

## 2019-03-02 MED ORDER — ALUM & MAG HYDROXIDE-SIMETH 200-200-20 MG/5ML PO SUSP
30.0000 mL | ORAL | Status: DC | PRN
  Administered 2019-03-05: 30 mL via ORAL
  Filled 2019-03-02: qty 30

## 2019-03-02 MED ORDER — HYDROMORPHONE HCL 1 MG/ML IJ SOLN
1.0000 mg | INTRAMUSCULAR | Status: DC | PRN
  Administered 2019-03-03 – 2019-03-04 (×8): 2 mg via INTRAVENOUS
  Filled 2019-03-02 (×8): qty 2

## 2019-03-02 MED ORDER — AMLODIPINE BESYLATE 10 MG PO TABS
10.0000 mg | ORAL_TABLET | Freq: Every day | ORAL | Status: DC
  Administered 2019-03-03 – 2019-03-04 (×2): 10 mg via ORAL
  Filled 2019-03-02 (×3): qty 1

## 2019-03-02 MED ORDER — ONDANSETRON HCL 4 MG PO TABS: 4.0000 mg | ORAL_TABLET | Freq: Four times a day (QID) | ORAL | Status: DC | PRN

## 2019-03-02 MED ORDER — EPHEDRINE SULFATE 50 MG/ML IJ SOLN
INTRAMUSCULAR | Status: DC | PRN
  Administered 2019-03-02 (×3): 10 mg via INTRAVENOUS

## 2019-03-02 MED ORDER — METHOCARBAMOL 500 MG PO TABS
500.0000 mg | ORAL_TABLET | Freq: Four times a day (QID) | ORAL | Status: DC | PRN
  Administered 2019-03-02 – 2019-03-04 (×6): 500 mg via ORAL
  Filled 2019-03-02 (×7): qty 1

## 2019-03-02 MED ORDER — GLYCOPYRROLATE 0.2 MG/ML IJ SOLN
INTRAMUSCULAR | Status: AC
  Filled 2019-03-02: qty 1

## 2019-03-02 MED ORDER — LIDOCAINE HCL (PF) 2 % IJ SOLN
INTRAMUSCULAR | Status: DC | PRN
  Administered 2019-03-02: 50 mg

## 2019-03-02 MED ORDER — TRANEXAMIC ACID-NACL 1000-0.7 MG/100ML-% IV SOLN: INTRAVENOUS | Status: DC | PRN

## 2019-03-02 MED ORDER — GABAPENTIN 300 MG PO CAPS
300.0000 mg | ORAL_CAPSULE | Freq: Three times a day (TID) | ORAL | Status: DC
  Administered 2019-03-02 – 2019-03-05 (×9): 300 mg via ORAL
  Filled 2019-03-02 (×9): qty 1

## 2019-03-02 MED ORDER — OXYCODONE HCL 5 MG PO TABS: 5.0000 mg | ORAL_TABLET | ORAL | Status: DC | PRN

## 2019-03-02 MED ORDER — FENTANYL CITRATE (PF) 100 MCG/2ML IJ SOLN
INTRAMUSCULAR | Status: AC
  Filled 2019-03-02: qty 2

## 2019-03-02 MED ORDER — PHENOL 1.4 % MT LIQD
1.0000 | OROMUCOSAL | Status: DC | PRN
  Filled 2019-03-02: qty 177

## 2019-03-02 SURGICAL SUPPLY — 68 items
BLADE SAGITTAL AGGR TOOTH XLG (BLADE) ×2 IMPLANT
BLADE SAW 90X13X1.19 OSCILLAT (BLADE) ×2 IMPLANT
BNDG COHESIVE 6X5 TAN STRL LF (GAUZE/BANDAGES/DRESSINGS) ×6 IMPLANT
CANISTER SUCT 1200ML W/VALVE (MISCELLANEOUS) ×2 IMPLANT
CANISTER WOUND CARE 500ML ATS (WOUND CARE) ×2 IMPLANT
CELL SAVER AUTOCOAGULATE (MISCELLANEOUS) ×2
CELL SAVER COLL SVCS (MISCELLANEOUS) ×2
CELL SAVER FILTER REG 2-05 PAL (MISCELLANEOUS) ×4
CELL SAVER SAMPLING PO (MISCELLANEOUS) ×2
CHLORAPREP W/TINT 26 (MISCELLANEOUS) ×2 IMPLANT
COVER WAND RF STERILE (DRAPES) ×2 IMPLANT
CUP ACETAB VERSA DBL 28X58 DMI (Orthopedic Implant) ×2 IMPLANT
DRAPE C-ARM XRAY 36X54 (DRAPES) ×2 IMPLANT
DRAPE INCISE IOBAN 66X60 STRL (DRAPES) IMPLANT
DRAPE POUCH INSTRU U-SHP 10X18 (DRAPES) ×2 IMPLANT
DRAPE SHEET LG 3/4 BI-LAMINATE (DRAPES) ×6 IMPLANT
DRAPE TABLE BACK 80X90 (DRAPES) ×2 IMPLANT
DRESSING SURGICEL FIBRLLR 1X2 (HEMOSTASIS) ×3 IMPLANT
DRSG OPSITE POSTOP 4X8 (GAUZE/BANDAGES/DRESSINGS) ×4 IMPLANT
DRSG SURGICEL FIBRILLAR 1X2 (HEMOSTASIS) ×6
ELECT BLADE 6.5 EXT (BLADE) ×2 IMPLANT
ELECT REM PT RETURN 9FT ADLT (ELECTROSURGICAL) ×2
ELECTRODE REM PT RTRN 9FT ADLT (ELECTROSURGICAL) ×1 IMPLANT
FILTER REG 2-05 PAL CELL SAVER (MISCELLANEOUS) ×2 IMPLANT
GLOVE BIOGEL PI IND STRL 9 (GLOVE) ×1 IMPLANT
GLOVE BIOGEL PI INDICATOR 9 (GLOVE) ×1
GLOVE SURG SYN 9.0  PF PI (GLOVE) ×2
GLOVE SURG SYN 9.0 PF PI (GLOVE) ×2 IMPLANT
GOWN SRG 2XL LVL 4 RGLN SLV (GOWNS) ×1 IMPLANT
GOWN STRL NON-REIN 2XL LVL4 (GOWNS) ×1
GOWN STRL REUS W/ TWL LRG LVL3 (GOWN DISPOSABLE) ×1 IMPLANT
GOWN STRL REUS W/TWL LRG LVL3 (GOWN DISPOSABLE) ×1
HEAD FEMORAL 28MM SZ M (Head) ×2 IMPLANT
HEMOVAC 400CC 10FR (MISCELLANEOUS) IMPLANT
HOLDER FOLEY CATH W/STRAP (MISCELLANEOUS) ×2 IMPLANT
HOOD PEEL AWAY FLYTE STAYCOOL (MISCELLANEOUS) ×2 IMPLANT
KIT PREVENA INCISION MGT 13 (CANNISTER) ×2 IMPLANT
MAT ABSORB  FLUID 56X50 GRAY (MISCELLANEOUS) ×1
MAT ABSORB FLUID 56X50 GRAY (MISCELLANEOUS) ×1 IMPLANT
NDL SAFETY ECLIPSE 18X1.5 (NEEDLE) ×1 IMPLANT
NEEDLE HYPO 18GX1.5 SHARP (NEEDLE) ×1
NEEDLE SPNL 18GX3.5 QUINCKE PK (NEEDLE) ×2 IMPLANT
NS IRRIG 1000ML POUR BTL (IV SOLUTION) ×2 IMPLANT
PACK HIP COMPR (MISCELLANEOUS) ×2 IMPLANT
PENCIL SMOKE ULTRAEVAC 22 CON (MISCELLANEOUS) ×2 IMPLANT
SAMPLING PO CELL SAVER (MISCELLANEOUS) ×1 IMPLANT
SAVE CELL AUTOCOAG (MISCELLANEOUS) ×1 IMPLANT
SAVER CELL COLL SVCS (MISCELLANEOUS) ×1 IMPLANT
SCALPEL PROTECTED #10 DISP (BLADE) ×4 IMPLANT
SHELL ACETABULAR SZ0 58MM (Shell) ×2 IMPLANT
SOL PREP PVP 2OZ (MISCELLANEOUS) ×2
SOLUTION PREP PVP 2OZ (MISCELLANEOUS) ×1 IMPLANT
SPONGE DRAIN TRACH 4X4 STRL 2S (GAUZE/BANDAGES/DRESSINGS) ×2 IMPLANT
STAPLER SKIN PROX 35W (STAPLE) ×2 IMPLANT
STEM FEMORAL SZ3  STD COLLARED (Stem) ×2 IMPLANT
STRAP SAFETY 5IN WIDE (MISCELLANEOUS) ×2 IMPLANT
SUT DVC 2 QUILL PDO  T11 36X36 (SUTURE) ×1
SUT DVC 2 QUILL PDO T11 36X36 (SUTURE) ×1 IMPLANT
SUT SILK 0 (SUTURE) ×1
SUT SILK 0 30XBRD TIE 6 (SUTURE) ×1 IMPLANT
SUT V-LOC 90 ABS DVC 3-0 CL (SUTURE) ×2 IMPLANT
SUT VIC AB 1 CT1 36 (SUTURE) ×2 IMPLANT
SYR 20CC LL (SYRINGE) ×2 IMPLANT
SYR 30ML LL (SYRINGE) ×2 IMPLANT
SYR BULB IRRIG 60ML STRL (SYRINGE) ×2 IMPLANT
TAPE MICROFOAM 4IN (TAPE) IMPLANT
TOWEL OR 17X26 4PK STRL BLUE (TOWEL DISPOSABLE) ×2 IMPLANT
TRAY FOLEY MTR SLVR 16FR STAT (SET/KITS/TRAYS/PACK) ×2 IMPLANT

## 2019-03-02 NOTE — H&P (Signed)
Reviewed paper H+P, will be scanned into chart. No changes noted.  

## 2019-03-02 NOTE — Clinical Social Work Note (Signed)
CSW received referral for SNF.  Case discussed with case manager and plan is to discharge home with home health.  CSW to sign off please re-consult if social work needs arise.  Anthem Frazer R. Arta Stump, MSW, LCSW [phone]  

## 2019-03-02 NOTE — Care Management (Signed)
Sent request to CMA bucket via Secure inbox requesting benefit check for Lovenox

## 2019-03-02 NOTE — Progress Notes (Signed)
PT Cancellation Note  Patient Details Name: Chad Mccann MRN: [id number] DOB: September 25, 1966   Cancelled Treatment:    Reason Eval/Treat Not Completed: Medical issues which prohibited therapy- patient has no feeling or movement in B LEs at this time. Will see patient tomorrow morning.    Cicley Ganesh 03/02/2019, 3:26 PM

## 2019-03-02 NOTE — Op Note (Signed)
03/02/2019  1:01 PM  PATIENT:  Chad Mccann  53 y.o. male  PRE-OPERATIVE DIAGNOSIS:  Osteoarthritis left hip  POST-OPERATIVE DIAGNOSIS:  Osteoarthritis left hip  PROCEDURE:  Procedure(s): TOTAL HIP ARTHROPLASTY ANTERIOR APPROACH-LEFT (Left)  SURGEON: Laurene Footman, MD  ASSISTANTS: None  ANESTHESIA:   spinal  EBL:  Total I/O In: 1250 [I.V.:1000; Blood:250] Out: 900 [Urine:250; Blood:650]  BLOOD ADMINISTERED:none  DRAINS: none   LOCAL MEDICATIONS USED:  MARCAINE     SPECIMEN:  Source of Specimen:  Left femoral head  DISPOSITION OF SPECIMEN:  PATHOLOGY  COUNTS:  YES  TOURNIQUET:  * No tourniquets in log *  IMPLANTS: Medacta AMIS 3 standard stem with ceramic millimeters head, 58 mm Mpact DM cup and liner  DICTATION: .Dragon Dictation   The patient was brought to the operating room and after spinal anesthesia was obtained patient was placed on the operative table with the ipsilateral foot into the Medacta attachment, contralateral leg on a well-padded table. C-arm was brought in and preop template x-ray taken. After prepping and draping in usual sterile fashion appropriate patient identification and timeout procedures were completed. Anterior approach to the hip was obtained and centered over the greater trochanter and TFL muscle. The subcutaneous tissue was incised hemostasis being achieved by electrocautery. TFL fascia was incised and the muscle retracted laterally deep retractor placed. The lateral femoral circumflex vessels were identified and ligated. The anterior capsule was exposed and a capsulotomy performed. The neck was identified and a femoral neck cut carried out with a saw. The head was removed without difficulty and showed sclerotic femoral head and acetabulum. Reaming was carried out to 56 mm and a 58 mm cup trial gave appropriate tightness to the acetabular component a 58 DM  cup was impacted into position. The leg was then externally rotated and ischiofemoral  and pubofemoral releases carried out. The femur was sequentially broached to a size 3, size 3, with M head trials were placed and the final components chosen. The 3 standard stem was inserted along with a ceramic M 28 mm head and 58 mm liner. The hip was reduced and was stable the wound was thoroughly irrigated with fibrillar placed along the posterior capsule and medial neck. The deep fascia ws closed using a heavy Quill after infiltration of 30 cc of quarter percent Sensorcaine with epinephrine.3-0 V-loc to close the skin with skin staples.  Incisional wound VAC applied, patient was sent to recovery in stable condition.   PLAN OF CARE: Admit to inpatient

## 2019-03-02 NOTE — Anesthesia Post-op Follow-up Note (Signed)
Anesthesia QCDR form completed.        

## 2019-03-02 NOTE — Anesthesia Procedure Notes (Signed)
Spinal  Patient location during procedure: OR Start time: 03/02/2019 11:10 AM End time: 03/02/2019 11:13 AM Staffing Anesthesiologist: Emmie Niemann, MD Resident/CRNA: Rolla Plate, CRNA Performed: resident/CRNA  Preanesthetic Checklist Completed: patient identified, site marked, surgical consent, pre-op evaluation, timeout performed, IV checked, risks and benefits discussed and monitors and equipment checked Spinal Block Patient position: sitting Prep: ChloraPrep Patient monitoring: heart rate, cardiac monitor, continuous pulse ox and blood pressure Approach: midline Location: L3-4 Injection technique: single-shot Needle Needle type: Pencil-Tip and Introducer  Needle gauge: 24 G Needle length: 9 cm Assessment Sensory level: T4

## 2019-03-02 NOTE — Care Management (Signed)
Sent secure email to [facility] and notified the patient will need a 3 in 1

## 2019-03-02 NOTE — TOC Initial Note (Signed)
Transition of Care [facility]) - Initial/Assessment Note    Patient Details  Name: Chad Mccann MRN: [id number] Date of Birth: 01-12-1966  Transition of Care [facility]) CM/SW Contact:    Su Hilt, RN Phone Number: 03/02/2019, 3:51 PM  Clinical Narrative:                   Expected Discharge Plan: [place] Barriers to Discharge: No Barriers Identified   Patient Goals and CMS Choice Patient states their goals for this hospitalization and ongoing recovery are:: go home CMS Medicare.gov Compare Post Acute Care list provided to:: Patient Choice offered to / list presented to : Patient  Expected Discharge Plan and Services Expected Discharge Plan: [place] Discharge Planning Services: CM Consult Post Acute Care Choice: [place] arrangements for the past 2 months: Single Family Home                 DME Arranged: 3-N-1 DME Agency: [facility] HH Arranged: PT    Prior Living Arrangements/Services Living arrangements for the past 2 months: Single Family Home Lives with:: Spouse Patient language and need for interpreter reviewed:: Yes Do you feel safe going back to the place where you live?: Yes      Need for Family Participation in Patient Care: No (Comment) Care giver support system in place?: Yes (comment) Current home services: (none) Criminal Activity/Legal Involvement Pertinent to Current Situation/Hospitalization: No - Comment as needed  Activities of Daily Living      Permission Sought/Granted Permission sought to share information with : Case Manager, Customer service manager Permission granted to share information with : Yes, Verbal Permission Granted     Permission granted to share info w AGENCY: [place] agency or SNF if needed        Emotional Assessment Appearance:: Appears stated age Attitude/Demeanor/Rapport: Engaged, Gracious Affect (typically observed): Accepting Orientation: : Oriented to Self,  Oriented to Place, Oriented to  Time, Oriented to Situation Alcohol / Substance Use: Never Used Psych Involvement: No (comment)  Admission diagnosis:  ARTHRITIS OF HIP Patient Active Problem List   Diagnosis Date Noted  . Status post total hip replacement, left 03/02/2019  . Strain of knee 08/17/2018  . Abdominal pain 02/11/2016  . Notalgia 12/10/2015  . History of transplantation, renal 12/10/2015  . Acid reflux 07/10/2015  . 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 BPH with obstruction/lower urinary tract symptoms 07/10/2015  . Hypogonadism in male 07/10/2015  . Erectile dysfunction of organic origin 07/10/2015  . Ventral hernia 12/05/2013  . MGUS (monoclonal gammopathy of unknown significance) 06/28/2013  . Focal and segmental hyalinosis 04/21/2013  . H/O kidney transplant 01/28/2012  . Adiposity 02/12/2011  . Obstructive apnea 02/12/2011  . Essential (primary) hypertension 09/05/2010   PCP:  [name], MD Pharmacy:   [place], [place] - [address] [place] [address] Phone: [phone] Fax: [phone]     Social Determinants of Health ([place]) Interventions    Readmission Risk Interventions 30 Day Unplanned Readmission Risk Score     Admission (Current) from 03/02/2019 in [place] (1A)  30 Day Unplanned Readmission Risk Score (%)  7 Filed at 03/02/2019 1200     This score is the patient's risk of an unplanned readmission within 30 days of being discharged (0 -100%). The score is based on dignosis, age, lab data, medications, orders, and past utilization.   Low:  0-14.9   Medium: 15-21.9  High: 22-29.9   Extreme: 30 and above       No flowsheet data found.

## 2019-03-02 NOTE — Anesthesia Preprocedure Evaluation (Signed)
Anesthesia Evaluation  Patient identified by MRN, date of birth, ID band Patient awake    Reviewed: Allergy & Precautions, NPO status , Patient's Chart, lab work & pertinent test results  History of Anesthesia Complications Negative for: history of anesthetic complications  Airway Mallampati: IV  TM Distance: >3 FB Neck ROM: Full    Dental no notable dental hx.    Pulmonary sleep apnea and Continuous Positive Airway Pressure Ventilation , neg COPD,    breath sounds clear to auscultation- rhonchi (-) wheezing      Cardiovascular hypertension, Pt. on medications (-) CAD, (-) Past MI, (-) Cardiac Stents and (-) CABG  Rhythm:Regular Rate:Normal - Systolic murmurs and - Diastolic murmurs    Neuro/Psych neg Seizures negative neurological ROS  negative psych ROS   GI/Hepatic Neg liver ROS, GERD  ,  Endo/Other  negative endocrine ROSneg diabetes  Renal/GU Renal disease (s/p kidney transplant, normal renal function now)     Musculoskeletal negative musculoskeletal ROS (+)   Abdominal (+) + obese,   Peds  Hematology negative hematology ROS (+)   Anesthesia Other Findings Past Medical History: No date: Benign prostatic hypertrophy No date: Bladder spasm No date: Erectile dysfunction No date: Frequency No date: GERD (gastroesophageal reflux disease) No date: Hypertension No date: Hypogonadism in male No date: Kidney 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 No date: Kidney, malrotation No date: MGUS (monoclonal gammopathy of unknown significance) No date: Monoclonal paraproteinemia No date: Obesity, unspecified No date: Other specified congenital anomaly of kidney No date: Other specified disorders of bladder No date: Other testicular hypofunction No date: Premature ejaculation No date: Sleep apnea No date: Ventral hernia   Reproductive/Obstetrics                             Lab Results  Component Value Date    WBC 5.8 02/22/2019   HGB 16.6 02/22/2019   HCT 48.7 02/22/2019   MCV 87.9 02/22/2019   PLT 210 02/22/2019    Anesthesia Physical Anesthesia Plan  ASA: II  Anesthesia Plan: Spinal   Post-op Pain Management:    Induction:   PONV Risk Score and Plan: 1 and Propofol infusion  Airway Management Planned: Natural Airway  Additional Equipment:   Intra-op Plan:   Post-operative Plan:   Informed Consent: I have reviewed the patients History and Physical, chart, labs and discussed the procedure including the risks, benefits and alternatives for the proposed anesthesia with the patient or authorized representative who has indicated his/her understanding and acceptance.     Dental advisory given  Plan Discussed with: CRNA and Anesthesiologist  Anesthesia Plan Comments:         Anesthesia Quick Evaluation

## 2019-03-02 NOTE — Progress Notes (Signed)
Notified Dr. Rudene Christians of cell saver results. Hgb 14.0, Hct 47.6, Plt 14. No new orders received.

## 2019-03-02 NOTE — Transfer of Care (Signed)
Immediate Anesthesia Transfer of Care Note  Patient: Chad Mccann  Procedure(s) Performed: TOTAL HIP ARTHROPLASTY ANTERIOR APPROACH-LEFT (Left )  Patient Location: PACU  Anesthesia Type:Spinal  Level of Consciousness: awake, alert  and oriented  Airway & Oxygen Therapy: Patient Spontanous Breathing  Post-op Assessment: Report given to RN and Post -op Vital signs reviewed and stable  Post vital signs: Reviewed  Last Vitals:  Vitals Value Taken Time  BP 130/93 03/02/2019  1:07 PM  Temp    Pulse 55 03/02/2019  1:08 PM  Resp 9 03/02/2019  1:08 PM  SpO2 99 % 03/02/2019  1:08 PM  Vitals shown include unvalidated device data.  Last Pain:  Vitals:   03/02/19 0930  TempSrc: Tympanic  PainSc: 7          Complications: No apparent anesthesia complications

## 2019-03-03 ENCOUNTER — Encounter: Payer: Self-pay | Admitting: Orthopedic Surgery

## 2019-03-03 LAB — CBC
HCT: 44.4 % (ref 39.0–52.0)
Hemoglobin: 15 g/dL (ref 13.0–17.0)
MCH: 30.4 pg (ref 26.0–34.0)
MCHC: 33.8 g/dL (ref 30.0–36.0)
MCV: 89.9 fL (ref 80.0–100.0)
NRBC: 0 % (ref 0.0–0.2)
Platelets: 165 10*3/uL (ref 150–400)
RBC: 4.94 MIL/uL (ref 4.22–5.81)
RDW: 13.4 % (ref 11.5–15.5)
WBC: 7.6 10*3/uL (ref 4.0–10.5)

## 2019-03-03 LAB — BASIC METABOLIC PANEL
Anion gap: 6 (ref 5–15)
BUN: 12 mg/dL (ref 6–20)
CO2: 24 mmol/L (ref 22–32)
Calcium: 8.1 mg/dL — ABNORMAL LOW (ref 8.9–10.3)
Chloride: 105 mmol/L (ref 98–111)
Creatinine, Ser: 1.11 mg/dL (ref 0.61–1.24)
GFR calc Af Amer: >60 mL/min (ref >60)
GFR calc non Af Amer: >60 mL/min (ref >60)
GLUCOSE: 128 mg/dL — AB (ref 70–99)
Potassium: 4.2 mmol/L (ref 3.5–5.1)
Sodium: 135 mmol/L (ref 135–145)

## 2019-03-03 NOTE — TOC Progression Note (Signed)
Transition of Care [facility]) - Progression Note    Patient Details  Name: Chad Mccann MRN: [id number] Date of Birth: 1966/11/15  Transition of Care [facility]) CM/SW Contact  Su Hilt, RN Phone Number: 03/03/2019, 12:40 PM  Clinical Narrative:       Expected Discharge Plan: [place] Barriers to Discharge: No Barriers Identified  Expected Discharge Plan and Services Expected Discharge Plan: [place] Discharge Planning Services: CM Consult Post Acute Care Choice: [place] arrangements for the past 2 months: Single Family Home                 DME Arranged: 3-N-1 DME Agency: [facility] HH Arranged: PT [place] Agency: [place] [facility])   Social Determinants of Health (SDOH) Interventions    Readmission Risk Interventions 30 Day Unplanned Readmission Risk Score     Admission (Current) from 03/02/2019 in [place] (1A)  30 Day Unplanned Readmission Risk Score (%)  11 Filed at 03/03/2019 1200     This score is the patient's risk of an unplanned readmission within 30 days of being discharged (0 -100%). The score is based on dignosis, age, lab data, medications, orders, and past utilization.   Low:  0-14.9   Medium: 15-21.9   High: 22-29.9   Extreme: 30 and above       No flowsheet data found.

## 2019-03-03 NOTE — Evaluation (Signed)
Physical Therapy Evaluation Patient Details Name: Chad Mccann MRN: [id number] DOB: [birth year] Today's Date: 03/03/2019   History of Present Illness  Mr. Chad Mccann" Lipinski is a 53 year old male s/p L anterior THA. Pt endorses pmh of kidney transplant (2013).   Clinical Impression  Pt is a 53 year old male who lives in a one story home with his wife.  He is independent and active at baseline.  Pt able to perform bed mobility independently and sit at EOB without difficulty.  Pt presented with good strength of UE's and LE's not limited by post-op pain.  Pt able to stand from bedside with CGA and VC's for management of RW and PWB status.  He ambulated 40 ft with RW and was able to maintain 50% WB and navigate obstacles slowly but without difficulty.  Pt reported 7/10 pain and RN in to give pt pain medication shortly after.  Pt will continue to benefit from skilled PT with focus on strength, tolerance to activity, pain management, HEP and safe functional mobility.  Pt will benefit from [facility] PT following discharge.    Follow Up Recommendations Home health PT;Supervision for mobility/OOB    Equipment Recommendations  None recommended by PT    Recommendations for Other Services       Precautions / Restrictions Precautions Precautions: None Restrictions Weight Bearing Restrictions: Yes LLE Weight Bearing: Partial weight bearing LLE Partial Weight Bearing Percentage or Pounds: 50%      Mobility  Bed Mobility Overal bed mobility: Needs Assistance Bed Mobility: Supine to Sit;Sit to Supine     Supine to sit: Supervision;HOB elevated Sit to supine: Min assist   General bed mobility comments: Min A to move L LE over EOB  Transfers Overall transfer level: Needs assistance Equipment used: Rolling walker (2 wheeled) Transfers: Sit to/from Stand Sit to Stand: From elevated surface;Min guard        Lateral/Scoot Transfers: Min guard General transfer comment: Pt able to stand from bedside  without physical assist.  Education provided for [facility] status and he was able to maintain precautions.  Ambulation/Gait Ambulation/Gait assistance: Min guard Gait Distance (Feet): 40 Feet Assistive device: Rolling walker (2 wheeled)     Gait velocity interpretation: <1.8 ft/sec, indicate of risk for recurrent falls General Gait Details: Pt able to navigate obstacles and restroom slowly but without difficulty.  Pt maintained 50% WB of L LE without difficulty.  Stairs            Wheelchair Mobility    Modified Rankin (Stroke Patients Only)       Balance Overall balance assessment: Needs assistance Sitting-balance support: Feet supported Sitting balance-[name] Scale: Good Sitting balance - Comments: Steady reaching within BOS. Indicated some dizziness when sitting EOB which resolved quickly.    Standing balance support: Bilateral upper extremity supported;During functional activity Standing balance-[name] Scale: Fair Standing balance comment: Relies on RW for now to maintain WB status                             Pertinent Vitals/Pain Pain Assessment: 0-10 Pain Score: 7  Pain Location: Surgical site Pain Descriptors / Indicators: Aching Pain Intervention(s): Limited activity within patient's tolerance;Monitored during session;Patient requesting pain meds-RN notified    Home Living Family/patient expects to be discharged to:: Private residence Living Arrangements: Spouse/significant other Available Help at Discharge: Family Type of Home: House Home Access: Stairs to enter Entrance Stairs-Rails: None Entrance Stairs-Number of Steps: 1 step  with additional small step over threshold at sunroom entrance where pt plans to enter home.  Home Layout: One level Home Equipment: Walker - 2 wheels;Cane - single point      Prior Function Level of Independence: Independent         Comments: Pt endorses working active job (lots of walking); very active prior to  surgery riding his motorcylcle, playing softball, and golfing regularly.      Hand Dominance        Extremity/Trunk Assessment   Upper Extremity Assessment Upper Extremity Assessment: Overall WFL for tasks assessed    Lower Extremity Assessment Lower Extremity Assessment: LLE deficits/detail;Defer to PT evaluation;Overall WFL for tasks assessed LLE Deficits / Details: Pt s/p L THA LLE: Unable to fully assess due to pain    Cervical / Trunk Assessment Cervical / Trunk Assessment: Normal  Communication   Communication: No difficulties  Cognition Arousal/Alertness: Awake/alert Behavior During Therapy: WFL for tasks assessed/performed Overall Cognitive Status: Within Functional Limits for tasks assessed                                 General Comments: Pt intermittently lethargic throughout evaluation      General Comments General comments (skin integrity, edema, etc.): Pt eager and agreeable t/o tx session. Wife and adult children are available to help upon DC.     Exercises Total Joint Exercises Ankle Circles/Pumps: 20 reps;Both;Supine Quad Sets: Strengthening;Both;10 reps;Supine Hip ABduction/ADduction: Strengthening;Left;10 reps;Supine Other Exercises Other Exercises: Issued HEP and reviewed with pt and wife.  x5 min Other Exercises: Pt assisted with LB dressing and gown change during session. Able to demo adaptive equipment during this functional activity.    Assessment/Plan    PT Assessment Patient needs continued PT services  PT Problem List Decreased strength;Decreased mobility;Decreased activity tolerance;Decreased balance;Decreased knowledge of use of DME;Pain       PT Treatment Interventions DME instruction;Therapeutic activities;Gait training;Therapeutic exercise;Patient/family education;Stair training;Balance training;Functional mobility training;Neuromuscular re-education    PT Goals (Current goals can be found in the Care Plan section)   Acute Rehab PT Goals Patient Stated Goal: To have less pain and go home. PT Goal Formulation: With patient Time For Goal Achievement: 03/17/19 Potential to Achieve Goals: Good    Frequency BID   Barriers to discharge        Co-evaluation               AM-PAC PT "6 Clicks" Mobility  Outcome Measure Help needed turning from your back to your side while in a flat bed without using bedrails?: None Help needed moving from lying on your back to sitting on the side of a flat bed without using bedrails?: None Help needed moving to and from a bed to a chair (including a wheelchair)?: A Little Help needed standing up from a chair using your arms (e.g., wheelchair or bedside chair)?: A Little Help needed to walk in hospital room?: A Little Help needed climbing 3-5 steps with a railing? : A Lot 6 Click Score: 19    End of Session Equipment Utilized During Treatment: Gait belt Activity Tolerance: Patient tolerated treatment well Patient left: in chair;with chair alarm set;with call bell/phone within reach;with family/visitor present Nurse Communication: Mobility status PT Visit Diagnosis: Unsteadiness on feet (R26.81);Muscle weakness (generalized) (M62.81);Other abnormalities of gait and mobility (R26.89);Pain Pain - Right/Left: Left Pain - part of body: Hip    Time: 3151-7616 PT Time Calculation (min) (ACUTE  ONLY): 39 min   Charges:   PT Evaluation $PT Eval Low Complexity: 1 Low PT Treatments $Therapeutic Exercise: 8-22 mins        Roxanne Gates, PT, DPT   Roxanne Gates 03/03/2019, 1:05 PM

## 2019-03-03 NOTE — Evaluation (Signed)
Occupational Therapy Evaluation Patient Details Name: Chad Mccann MRN: [id number] DOB: 1966/01/27 Today's Date: 03/03/2019    History of Present Illness Chad Mccann is a 52 year old male s/p L anterior THA. Pt endorses pmh of kidney transplant (2013).    Clinical Impression   Chad Mccann was seen for OT evaluation this date, POD#1 from above surgery. 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 Pt is eager to return to PLOF with less pain and improved safety and independence. Pt currently requires mod assist for LB dressing and bathing while in seated position due to pain and limited AROM of L hip. Pt instructed in self care skills, falls prevention strategies, home/routines modifications, DME/AE for LB bathing and dressing tasks, and compression stocking mgt strategies. Pt would benefit from additional instruction in self care skills and techniques to help maintain WB precautions with or without assistive devices to support recall and carryover prior to discharge. Recommend HHOT upon hospital discharge.      Follow Up Recommendations  Home health OT;Supervision/Assistance - 24 hour    Equipment Recommendations  3 in 1 bedside commode;Tub/shower bench    Recommendations for Other Services       Precautions / Restrictions Precautions Precautions: None Restrictions Weight Bearing Restrictions: Yes LLE Weight Bearing: Partial weight bearing LLE Partial Weight Bearing Percentage or Pounds: 50%      Mobility Bed Mobility Overal bed mobility: Needs Assistance Bed Mobility: Supine to Sit;Sit to Supine     Supine to sit: Supervision;HOB elevated Sit to supine: Min assist   General bed mobility comments: Pt able to come EOB with HOB elevated with supervision. Required light min A for sit>sup for mgmt of  LLE  Transfers Overall transfer level: Needs assistance Equipment used: Rolling walker (2 wheeled) Transfers: Sit to/from Stand;Lateral/Scoot Transfers Sit to Stand: From elevated surface;Min guard        Lateral/Scoot Transfers: Min guard General transfer comment: Min guard for safety. Pt required some cueing for hand placement and sequencing during sit<>stand.    Balance Overall balance assessment: Needs assistance Sitting-balance support: Single extremity supported;Feet supported Sitting balance-Mccann Scale: Good Sitting balance - Comments: Steady reaching within BOS. Indicated some dizziness when sitting EOB which resolved quickly.    Standing balance support: Bilateral upper extremity supported;During functional activity Standing balance-Mccann Scale: Fair Standing balance comment: Pt able to maintain standing balance during LB dressing while adhering to PWB status with B UE on RW.                            ADL either performed or assessed with clinical judgement   ADL Overall ADL's : Needs assistance/impaired Eating/Feeding: Set up;Independent   Grooming: Set up;Independent   Upper Body Bathing: Set up;Sitting;Independent;Supervision/ safety   Lower Body Bathing: Set up;Sit to/from stand;Moderate assistance   Upper Body Dressing : Set up;Independent   Lower Body Dressing: Set up;Moderate assistance;Sit to/from stand;With adaptive equipment   Toilet Transfer: Min guard;Ambulation;RW;Requires wide/bariatric;BSC;Cueing for sequencing   Toileting- Clothing Manipulation and Hygiene: Sit to/from stand;Moderate assistance;Cueing for safety;Set up   Tub/ Shower Transfer: Min guard;Tub bench;Ambulation;Cueing for safety   Functional mobility during ADLs: Min guard;Rolling walker       Vision Baseline Vision/History: No visual deficits Patient Visual Report: No change from baseline  Perception     Praxis      Pertinent Vitals/Pain Pain  Assessment: 0-10 Pain Score: 7  Pain Location: L hip Pain Descriptors / Indicators: Aching;Constant;Grimacing;Operative site guarding Pain Intervention(s): Limited activity within patient's tolerance;Monitored during session;RN gave pain meds during session;Repositioned     Hand Dominance     Extremity/Trunk Assessment Upper Extremity Assessment Upper Extremity Assessment: Overall WFL for tasks assessed   Lower Extremity Assessment Lower Extremity Assessment: LLE deficits/detail;Defer to PT evaluation;Overall WFL for tasks assessed LLE Deficits / Details: Pt s/p L THA LLE: Unable to fully assess due to pain   Cervical / Trunk Assessment Cervical / Trunk Assessment: Normal   Communication Communication Communication: No difficulties   Cognition Arousal/Alertness: Awake/alert Behavior During Therapy: WFL for tasks assessed/performed Overall Cognitive Status: Within Functional Limits for tasks assessed                                 General Comments: Pt became somewhat lethargic after pain meds administered, but was able to complete all evaluation activities and attend to education with verbal prompting.    General Comments  Pt eager and agreeable t/o tx session. Wife and adult children are available to help upon DC.     Exercises Other Exercises Other Exercises: Pt educated in falls prevention strategy, safe use of AE/DME for LB dressing, safe transfer/bed mobility strategies, and compression stocking mgmt. Other Exercises: Pt assisted with LB dressing and gown change during session. Able to demo adaptive equipment during this functional activity.    Shoulder Instructions      Home Living Family/patient expects to be discharged to:: Private residence Living Arrangements: Spouse/significant other Available Help at Discharge: Family Type of Home: House Home Access: Stairs to enter [employer] of Steps: 1 step with additional small step over  threshold at sunroom entrance where pt plans to enter home.  Entrance Stairs-Rails: None Home Layout: One level     Bathroom Shower/Tub: Teacher, early years/pre: Standard Bathroom Accessibility: Yes How Accessible: Accessible via walker Home Equipment: [place] - 2 wheels;Cane - single point          Prior Functioning/Environment Level of Independence: Independent        Comments: Pt endorses working active job (lots of walking); very active prior to surgery riding his motorcylcle, playing softball, and golfing regularly.         OT Problem List: Decreased strength;Impaired balance (sitting and/or standing);Decreased knowledge of precautions;Decreased safety awareness;Decreased knowledge of use of DME or AE;Decreased activity tolerance;Decreased range of motion;Decreased coordination;Pain;Obesity      OT Treatment/Interventions: Self-care/ADL training;Therapeutic exercise;Therapeutic activities;Patient/family education;DME and/or AE instruction    OT Goals(Current goals can be found in the care plan section) Acute Rehab OT Goals Patient Stated Goal: To have less pain and go home. OT Goal Formulation: With patient/family Time For Goal Achievement: 03/17/19 Potential to Achieve Goals: Good ADL Goals Pt Will Perform Lower Body Bathing: with modified independence;sit to/from stand;with adaptive equipment(With LRAD/DME for safety) Pt Will Perform Lower Body Dressing: with modified independence;with adaptive equipment;sit to/from stand(With LRAD/DME for safety) Pt Will Transfer to Toilet: with modified independence;regular height toilet;ambulating(With LRAD/DME for safety) Additional ADL Goal #1: Pt will independently verbalize plan to implement at least 2 learned falls prevention strategies into his daily routines in order to maximize pt safety and independence upon hospital DC.  OT Frequency: Min 1X/week   Barriers to D/C:  Co-evaluation               AM-PAC OT "6 Clicks" Daily Activity     Outcome Measure Help from another person eating meals?: None Help from another person taking care of personal grooming?: None Help from another person toileting, which includes using toliet, bedpan, or urinal?: A Little Help from another person bathing (including washing, rinsing, drying)?: A Lot Help from another person to put on and taking off regular upper body clothing?: A Little Help from another person to put on and taking off regular lower body clothing?: A Lot 6 Click Score: 18   End of Session Equipment Utilized During Treatment: Gait belt;Rolling walker(long handle reacher, sock aid. ) Nurse Communication: Mobility status  Activity Tolerance: Patient tolerated treatment well Patient left: in bed;with call bell/phone within reach;with bed alarm set;with family/visitor present;with SCD's reapplied  OT Visit Diagnosis: Other abnormalities of gait and mobility (R26.89);Pain Pain - Right/Left: Left Pain - part of body: Hip                Time: 2500-3704 OT Time Calculation (min): 54 min Charges:  OT General Charges $OT Visit: 1 Visit OT Evaluation $OT Eval Low Complexity: 1 Low OT Treatments $Self Care/Home Management : 38-52 mins  Shara Blazing, M.S., OTR/L Ascom: [phone] 03/03/19, 10:35 AM

## 2019-03-03 NOTE — Anesthesia Postprocedure Evaluation (Signed)
Anesthesia Post Note  Patient: Chad Mccann  Procedure(s) Performed: TOTAL HIP ARTHROPLASTY ANTERIOR APPROACH-LEFT (Left )  Patient location during evaluation: Nursing Unit Anesthesia Type: Spinal Level of consciousness: awake, awake and alert and oriented Pain management: pain level controlled Vital Signs Assessment: post-procedure vital signs reviewed and stable Respiratory status: spontaneous breathing, respiratory function stable and nonlabored ventilation Cardiovascular status: blood pressure returned to baseline and stable Postop Assessment: no headache and no backache Anesthetic complications: no     Last Vitals:  Vitals:   03/03/19 0514 03/03/19 0729  BP:  (!) 147/77  Pulse:  87  Resp:    Temp: 36.6 C 37.3 C  SpO2:  95%    Last Pain:  Vitals:   03/03/19 1008  TempSrc:   PainSc: 9                  [employer]

## 2019-03-03 NOTE — Progress Notes (Addendum)
Physical Therapy Treatment Patient Details Name: Chad Mccann MRN: [id number] DOB: Mar 14, 1966 Today's Date: 03/03/2019    History of Present Illness Mr. Chad Mccann is a 53 year old male s/p L anterior THA. Pt endorses pmh of kidney transplant (2013).     PT Comments    Pt more alert this afternoon but reporting increased pain in L hip.  He was able to perform transfers and ambulation with CGA and VC's for management of RW and WB precautions.  Pt with slightly more antalgic gait during ambulation but able to complete 50 ft and toileting with CGA/supervision.  Pt open to all education and pt's wife proficient in assisting pt with there ex.  Two reminders needed throughout session for WB and pt promptly corrected.  Pt will continue to benefit from skilled PT with focus on strength, pain management, tolerance to activity and HEP.   Follow Up Recommendations  Home health PT;Supervision for mobility/OOB     Equipment Recommendations  None recommended by PT    Recommendations for Other Services       Precautions / Restrictions Precautions Precautions: None Restrictions Weight Bearing Restrictions: Yes LLE Weight Bearing: Partial weight bearing LLE Partial Weight Bearing Percentage or Pounds: 50%    Mobility  Bed Mobility Overal bed mobility: Needs Assistance Bed Mobility: Sit to Supine     Supine to sit: Supervision;HOB elevated Sit to supine: Min assist;HOB elevated   General bed mobility comments: Min A to move L LE over EOB  Transfers Overall transfer level: Needs assistance Equipment used: Rolling walker (2 wheeled) Transfers: Sit to/from Stand Sit to Stand: From elevated surface;Min guard         General transfer comment: Pt able to stand from bedside without physical assist.  Education provided for [facility] status and he was able to maintain precautions.  Ambulation/Gait Ambulation/Gait assistance: Min guard Gait Distance (Feet): 50 Feet Assistive device:  Rolling walker (2 wheeled)     Gait velocity interpretation: <1.8 ft/sec, indicate of risk for recurrent falls General Gait Details: Pt reporting "stiffness" during gait and with mildly more antalgic gait than this morning.  Able to maintain WB status.   Stairs             Wheelchair Mobility    Modified Rankin (Stroke Patients Only)       Balance Overall balance assessment: Needs assistance Sitting-balance support: Feet supported Sitting balance-[name] Scale: Good     Standing balance support: Bilateral upper extremity supported;During functional activity Standing balance-[name] Scale: Fair Standing balance comment: Relies on RW for now to maintain WB status                            Cognition Arousal/Alertness: Awake/alert Behavior During Therapy: WFL for tasks assessed/performed Overall Cognitive Status: Within Functional Limits for tasks assessed                                 General Comments: More alert this afternoon      Exercises Total Joint Exercises Ankle Circles/Pumps: 20 reps;Both;Supine Quad Sets: Strengthening;Both;10 reps;Supine Heel Slides: AAROM;Strengthening;10 reps;Left;Supine Hip ABduction/ADduction: Strengthening;Left;10 reps;Supine Straight Leg Raises: AAROM;Left;10 reps;Supine Other Exercises Other Exercises: Reviewed HEP and educated wife concerning assisting pt with exercises.  Pt's wife demonstrated proficiency with this.  x10 min Other Exercises: Time allowed for toileting and VC's for managaement of RW and navigation of small space x10  min    General Comments        Pertinent Vitals/Pain Pain Assessment: 0-10 Pain Score: 7  Pain Location: L hip Pain Descriptors / Indicators: Aching Pain Intervention(s): Limited activity within patient's tolerance;Monitored during session    [place] expects to be discharged to:: Private residence Living Arrangements: Spouse/significant  other Available Help at Discharge: Family Type of Home: House Home Access: Stairs to enter Entrance Stairs-Rails: None Home Layout: One level Home Equipment: Environmental consultant - 2 wheels;Cane - single point      Prior Function Level of Independence: Independent      Comments: Pt endorses working active job (lots of walking); very active prior to surgery riding his motorcylcle, playing softball, and golfing regularly.    PT Goals (current goals can now be found in the care plan section) Acute Rehab PT Goals Patient Stated Goal: To have less pain and go home. PT Goal Formulation: With patient Time For Goal Achievement: 03/17/19 Potential to Achieve Goals: Good Progress towards PT goals: Progressing toward goals    Frequency    BID      PT Plan Current plan remains appropriate    Co-evaluation              AM-PAC PT "6 Clicks" Mobility   Outcome Measure  Help needed turning from your back to your side while in a flat bed without using bedrails?: None Help needed moving from lying on your back to sitting on the side of a flat bed without using bedrails?: None Help needed moving to and from a bed to a chair (including a wheelchair)?: A Little Help needed standing up from a chair using your arms (e.g., wheelchair or bedside chair)?: A Little Help needed to walk in hospital room?: A Little Help needed climbing 3-5 steps with a railing? : A Lot 6 Click Score: 19    End of Session Equipment Utilized During Treatment: Gait belt Activity Tolerance: Patient tolerated treatment well Patient left: with call bell/phone within reach;with family/visitor present;in bed;with bed alarm set Nurse Communication: Mobility status PT Visit Diagnosis: Unsteadiness on feet (R26.81);Muscle weakness (generalized) (M62.81);Other abnormalities of gait and mobility (R26.89);Pain Pain - Right/Left: Left Pain - part of body: Hip     Time: 4098-1191, ADDENDED 1621, 03/03/2019 PT Time Calculation (min)  (ACUTE ONLY): 39 min  Charges:  $Therapeutic Exercise: 8-22 mins $Therapeutic Activity: 8-22 mins                     Roxanne Gates, PT, DPT    Roxanne Gates 03/03/2019, 4:00 PM, ADDENDED 4782, 13/10/2019

## 2019-03-03 NOTE — TOC Benefit Eligibility Note (Signed)
Transition of Care [facility]) Benefit Eligibility Note    Patient Details  Name: Chad Mccann MRN: [id number] Date of Birth: January 29, 1966   Medication/Dose: Lovenox 40mg  once daily for 14 days  Covered?: Yes     Prescription Coverage Preferred Pharmacy: [place] (Mail Order)  Spoke with Person/Company/Phone Number:: Amy/[employer] [id number]/[phone]  Co-Pay: 1 syringe = $5.04, $70.56 for 14 syringes.  Prior Approval: No  Deductible: Unmet  Additional Notes: Generic Enoxaparin cost estimated around $40.  Prior approval not required.     Dannette Barbara Phone Number: [phone] or [phone] 03/03/2019, 8:59 AM

## 2019-03-03 NOTE — Progress Notes (Signed)
   Subjective: 1 Day Post-Op Procedure(s) (LRB): TOTAL HIP ARTHROPLASTY ANTERIOR APPROACH-LEFT (Left) Patient reports pain as moderate.   Patient is well, and has had no acute complaints or problems Denies any CP, SOB, ABD pain. We will continue therapy today.  Plan is to go Home after hospital stay.  Objective: Vital signs in last 24 hours: Temp:  [97.1 F (36.2 C)-100.7 F (38.2 C)] 99.1 F (37.3 C) (03/11 0729) Pulse Rate:  [49-90] 87 (03/11 0729) Resp:  [0-19] 18 (03/11 0405) BP: (130-160)/(66-93) 147/77 (03/11 0729) SpO2:  [95 %-100 %] 95 % (03/11 0729) Weight:  [123.9 kg] 123.9 kg (03/10 0930)  Intake/Output from previous day: 03/10 0701 - 03/11 0700 In: 2867.5 [P.O.:240; I.V.:1952.5; Blood:375; IV Piggyback:300.1] Out: 2708 [Urine:2058; Blood:650] Intake/Output this shift: No intake/output data recorded.  Recent Labs    03/02/19 1511  HGB 16.5   Recent Labs    03/02/19 1511  WBC 9.4  RBC 5.48  HCT 49.8  PLT 204   Recent Labs    03/02/19 1511  CREATININE 1.20   No results for input(s): LABPT, INR in the last 72 hours.  EXAM General - Patient is Alert, Appropriate and Oriented Extremity - Neurovascular intact Sensation intact distally Intact pulses distally Dorsiflexion/Plantar flexion intact No cellulitis present Compartment soft Dressing - dressing C/D/I and no drainage Praveena intact Motor Function - intact, moving foot and toes well on exam.   Past Medical History:  Diagnosis Date  . Benign prostatic hypertrophy   . Bladder spasm   . Erectile dysfunction   . Frequency   . GERD (gastroesophageal reflux disease)   . Hypertension   . Hypogonadism in male   . Kidney 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   . Kidney, malrotation   . MGUS (monoclonal gammopathy of unknown significance)   . Monoclonal paraproteinemia   . Obesity, unspecified   . Other specified congenital anomaly of kidney   . Other specified disorders of bladder   . Other testicular  hypofunction   . Premature ejaculation   . Sleep apnea   . Ventral hernia     Assessment/Plan:   1 Day Post-Op Procedure(s) (LRB): TOTAL HIP ARTHROPLASTY ANTERIOR APPROACH-LEFT (Left) Active Problems:   Status post total hip replacement, left  Estimated body mass index is 39.2 kg/m as calculated from the following:   Height as of this encounter: 5\' 10"  (1.778 m).   Weight as of this encounter: 123.9 kg. Advance diet Up with therapy  Will check CBC and BMP this morning Vital signs are stable, encourage incentive spirometer We will work on having bowel movement. Care manager to assist with discharge  DVT Prophylaxis - Lovenox, TED hose and SCDs Weight-Bearing as tolerated to left leg   T. Rachelle Hora, PA-C [place] 03/03/2019, 8:07 AM

## 2019-03-03 NOTE — Care Management (Signed)
Sent [facility] the patient information and requested that Helene Kelp check to see if INN and let me know, she has accepted the patient and will let me know if [facility] is INN

## 2019-03-04 MED ORDER — ENOXAPARIN SODIUM 40 MG/0.4ML ~~LOC~~ SOLN: 40.0000 mg | SUBCUTANEOUS | 0 refills | Status: DC

## 2019-03-04 MED ORDER — METHOCARBAMOL 500 MG PO TABS: 500.0000 mg | ORAL_TABLET | Freq: Four times a day (QID) | ORAL | 0 refills | Status: DC | PRN

## 2019-03-04 MED ORDER — OXYCODONE HCL 10 MG PO TABS: 10.0000 mg | ORAL_TABLET | ORAL | 0 refills | Status: DC | PRN

## 2019-03-04 MED ORDER — BISACODYL 10 MG RE SUPP
10.0000 mg | Freq: Once | RECTAL | Status: AC
  Administered 2019-03-04: 10 mg via RECTAL
  Filled 2019-03-04: qty 1

## 2019-03-04 MED ORDER — ACETAMINOPHEN 325 MG PO TABS: 325.0000 mg | ORAL_TABLET | Freq: Four times a day (QID) | ORAL | Status: AC | PRN

## 2019-03-04 MED ORDER — TRAMADOL HCL 50 MG PO TABS: 50.0000 mg | ORAL_TABLET | Freq: Four times a day (QID) | ORAL | 0 refills | Status: DC | PRN

## 2019-03-04 NOTE — Progress Notes (Addendum)
   Subjective: 2 Days Post-Op Procedure(s) (LRB): TOTAL HIP ARTHROPLASTY ANTERIOR APPROACH-LEFT (Left) Patient reports pain as mild.   Patient is well, and has had no acute complaints or problems Denies any CP, SOB, ABD pain. We will continue therapy today.  Plan is to go Home after hospital stay.  Objective: Vital signs in last 24 hours: Temp:  [98.1 F (36.7 C)-99.6 F (37.6 C)] 98.1 F (36.7 C) (03/12 0821) Pulse Rate:  [76-87] 87 (03/12 0821) Resp:  [16-19] 16 (03/12 0821) BP: (110-150)/(63-85) 131/69 (03/12 0821) SpO2:  [94 %-99 %] 99 % (03/12 0821)  Intake/Output from previous day: 03/11 0701 - 03/12 0700 In: 1300.1 [P.O.:720; I.V.:580.1] Out: 250 [Urine:250] Intake/Output this shift: Total I/O In: 240 [P.O.:240] Out: -   Recent Labs    03/02/19 1511 03/03/19 0815  HGB 16.5 15.0   Recent Labs    03/02/19 1511 03/03/19 0815  WBC 9.4 7.6  RBC 5.48 4.94  HCT 49.8 44.4  PLT 204 165   Recent Labs    03/02/19 1511 03/03/19 0815  NA  --  135  K  --  4.2  CL  --  105  CO2  --  24  BUN  --  12  CREATININE 1.20 1.11  GLUCOSE  --  128*  CALCIUM  --  8.1*   No results for input(s): LABPT, INR in the last 72 hours.  EXAM General - Patient is Alert, Appropriate and Oriented Extremity - Neurovascular intact Sensation intact distally Intact pulses distally Dorsiflexion/Plantar flexion intact No cellulitis present Compartment soft Dressing - dressing C/D/I and no drainage Praveena intact Motor Function - intact, moving foot and toes well on exam.   Past Medical History:  Diagnosis Date  . Benign prostatic hypertrophy   . Bladder spasm   . Erectile dysfunction   . Frequency   . GERD (gastroesophageal reflux disease)   . Hypertension   . Hypogonadism in male   . Kidney 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   . Kidney, malrotation   . MGUS (monoclonal gammopathy of unknown significance)   . Monoclonal paraproteinemia   . Obesity, unspecified   . Other specified  congenital anomaly of kidney   . Other specified disorders of bladder   . Other testicular hypofunction   . Premature ejaculation   . Sleep apnea   . Ventral hernia     Assessment/Plan:   2 Days Post-Op Procedure(s) (LRB): TOTAL HIP ARTHROPLASTY ANTERIOR APPROACH-LEFT (Left) Active Problems:   Status post total hip replacement, left  Estimated body mass index is 39.2 kg/m as calculated from the following:   Height as of this encounter: 5\' 10"  (1.778 m).   Weight as of this encounter: 123.9 kg. Advance diet Up with therapy  Labs stable Vital signs are stable, encourage incentive spirometer We will work on having bowel movement. Care manager to assist with discharge  DVT Prophylaxis - Lovenox, TED hose and SCDs Partial weightbearing to left leg   T. Rachelle Hora, PA-C [place] 03/04/2019, 11:13 AM

## 2019-03-04 NOTE — Progress Notes (Signed)
Physical Therapy Treatment Patient Details Name: Chad Mccann MRN: [id number] DOB: 06-08-1966 Today's Date: 03/04/2019    History of Present Illness 53 year old male s/p L anterior THA. Pt endorses pmh of kidney transplant (2013).     PT Comments    Pt did well with PT session with increased ambulation tolerance, strength, and ease with mobility.  Pt with slow, guarded ambulation, but was able to circumambulate the nurses' station with good safety and ability to maintain [place] well.    Follow Up Recommendations  Home health PT     Equipment Recommendations  None recommended by PT    Recommendations for Other Services       Precautions / Restrictions Precautions Precautions: None Restrictions Weight Bearing Restrictions: Yes LLE Weight Bearing: Partial weight bearing LLE Partial Weight Bearing Percentage or Pounds: 50%    Mobility  Bed Mobility Overal bed mobility: Modified Independent Bed Mobility: Supine to Sit     Supine to sit: Supervision     General bed mobility comments: Pt was easily able to get himself to EOB w/o assist  Transfers Overall transfer level: Modified independent Equipment used: Rolling walker (2 wheeled) Transfers: Sit to/from Stand Sit to Stand: Modified independent (Device/Increase time)         General transfer comment: Pt was able to rise to standing w/o hesitation from low bed setting, needed only minimal verbal cuing  Ambulation/Gait Ambulation/Gait assistance: Min guard Gait Distance (Feet): 200 Feet Assistive device: Rolling walker (2 wheeled)       General Gait Details: Pt with slow but consistent and apart from pain relatively confident gait.  He was reliant on walker (and did well with maintaining PWBing) but did have some UE fatigue with the effort.  Vitals stable t/o the effort with O2 in the high 90s and HR 80s to 90s   Stairs             Wheelchair Mobility    Modified Rankin (Stroke Patients Only)        Balance Overall balance assessment: Modified Independent                                          Cognition Arousal/Alertness: Awake/alert Behavior During Therapy: WFL for tasks assessed/performed Overall Cognitive Status: Within Functional Limits for tasks assessed                                        Exercises Total Joint Exercises Ankle Circles/Pumps: AROM;10 reps Quad Sets: Strengthening;10 reps Short Arc Quad: Strengthening;10 reps Heel Slides: AROM;10 reps(with resisted leg extensions) Hip ABduction/ADduction: Strengthening;Left;10 reps;Supine Straight Leg Raises: AAROM;Left;10 reps;Supine    General Comments        Pertinent Vitals/Pain Pain Assessment: 0-10 Pain Score: 8 (less at rest, but definitely feeling it the whole time)    Home Living                      Prior Function            PT Goals (current goals can now be found in the care plan section) Progress towards PT goals: Progressing toward goals    Frequency    BID      PT Plan Current plan remains appropriate    Co-evaluation  AM-PAC PT "6 Clicks" Mobility   Outcome Measure  Help needed turning from your back to your side while in a flat bed without using bedrails?: None Help needed moving from lying on your back to sitting on the side of a flat bed without using bedrails?: None Help needed moving to and from a bed to a chair (including a wheelchair)?: None Help needed standing up from a chair using your arms (e.g., wheelchair or bedside chair)?: None Help needed to walk in hospital room?: None Help needed climbing 3-5 steps with a railing? : A Little 6 Click Score: 23    End of Session Equipment Utilized During Treatment: Gait belt Activity Tolerance: Patient tolerated treatment well Patient left: with call bell/phone within reach;with family/visitor present;in bed;with bed alarm set Nurse Communication: Mobility  status PT Visit Diagnosis: Unsteadiness on feet (R26.81);Muscle weakness (generalized) (M62.81);Other abnormalities of gait and mobility (R26.89);Pain Pain - Right/Left: Left Pain - part of body: Hip     Time: 3382-5053 PT Time Calculation (min) (ACUTE ONLY): 29 min  Charges:  $Gait Training: 8-22 mins $Therapeutic Exercise: 8-22 mins                     Kreg Shropshire, DPT 03/04/2019, 10:56 AM

## 2019-03-04 NOTE — Discharge Instructions (Signed)

## 2019-03-04 NOTE — Progress Notes (Signed)
Physical Therapy Treatment Patient Details Name: Chad Mccann MRN: [id number] DOB: 05-Jun-1966 Today's Date: 03/04/2019    History of Present Illness 53 year old male s/p L anterior THA. Pt endorses pmh of kidney transplant (2013).     PT Comments    Pt continues to have significant pain with any hip flexion activities, could not tolerate AAROM SLRs.  Pt reports he has been doing some of his exercises as well as getting up and walking to/from bathroom multiple times since the AM session.  Pt in 8/10 pain but still showed great effort with supine exercises.   Follow Up Recommendations  Home health PT     Equipment Recommendations  None recommended by PT    Recommendations for Other Services       Precautions / Restrictions Precautions Precautions: Anterior Hip Restrictions LLE Weight Bearing: Partial weight bearing LLE Partial Weight Bearing Percentage or Pounds: 50%    Mobility  Bed Mobility Overal bed mobility: Modified Independent Bed Mobility: Sit to Supine       Sit to supine: Min guard   General bed mobility comments: Pt needed heavy UE assist to get L LE back into bed from sitting  Transfers                    Ambulation/Gait                 Stairs             Wheelchair Mobility    Modified Rankin (Stroke Patients Only)       Balance                                            Cognition Arousal/Alertness: Awake/alert Behavior During Therapy: WFL for tasks assessed/performed Overall Cognitive Status: Within Functional Limits for tasks assessed                                        Exercises Total Joint Exercises Ankle Circles/Pumps: AROM;10 reps Quad Sets: Strengthening;15 reps Short Arc Quad: Strengthening;15 reps Heel Slides: AROM;10 reps(lightly resisted flexion, heavy resisted leg extensions) Hip ABduction/ADduction: AROM;10 reps Straight Leg Raises: (pt with too much pain even  with AAROM)    General Comments        Pertinent Vitals/Pain Pain Assessment: 0-10 Pain Score: 8     Home Living                      Prior Function            PT Goals (current goals can now be found in the care plan section) Progress towards PT goals: Progressing toward goals    Frequency    BID      PT Plan Current plan remains appropriate    Co-evaluation              AM-PAC PT "6 Clicks" Mobility   Outcome Measure  Help needed turning from your back to your side while in a flat bed without using bedrails?: None Help needed moving from lying on your back to sitting on the side of a flat bed without using bedrails?: None Help needed moving to and from a bed to a chair (including a wheelchair)?: None Help needed standing  up from a chair using your arms (e.g., wheelchair or bedside chair)?: None Help needed to walk in hospital room?: None Help needed climbing 3-5 steps with a railing? : A Little 6 Click Score: 23    End of Session   Activity Tolerance: Patient tolerated treatment well Patient left: with bed alarm set;with call bell/phone within reach;with family/visitor present   PT Visit Diagnosis: Unsteadiness on feet (R26.81);Muscle weakness (generalized) (M62.81);Other abnormalities of gait and mobility (R26.89);Pain Pain - Right/Left: Left Pain - part of body: Hip     Time: 1736-1750 PT Time Calculation (min) (ACUTE ONLY): 14 min  Charges:  $Therapeutic Exercise: 8-22 mins                     Kreg Shropshire, DPT 03/04/2019, 5:57 PM

## 2019-03-04 NOTE — Discharge Summary (Addendum)
Physician Discharge Summary  Patient ID: Chad Mccann MRN: [id number] DOB/AGE: [birth year] 53 y.o.  Admit date: 03/02/2019 Discharge date: 03/05/2019 Admission Diagnoses:  ARTHRITIS OF HIP   Discharge Diagnoses: Patient Active Problem List   Diagnosis Date Noted  . Status post total hip replacement, left 03/02/2019  . Strain of knee 08/17/2018  . Abdominal pain 02/11/2016  . Notalgia 12/10/2015  . History of transplantation, renal 12/10/2015  . Acid reflux 07/10/2015  . 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 BPH with obstruction/lower urinary tract symptoms 07/10/2015  . Hypogonadism in male 07/10/2015  . Erectile dysfunction of organic origin 07/10/2015  . Ventral hernia 12/05/2013  . MGUS (monoclonal gammopathy of unknown significance) 06/28/2013  . Focal and segmental hyalinosis 04/21/2013  . H/O kidney transplant 01/28/2012  . Adiposity 02/12/2011  . Obstructive apnea 02/12/2011  . Essential (primary) hypertension 09/05/2010    Past Medical History:  Diagnosis Date  . Benign prostatic hypertrophy   . Bladder spasm   . Erectile dysfunction   . Frequency   . GERD (gastroesophageal reflux disease)   . Hypertension   . Hypogonadism in male   . Kidney 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   . Kidney, malrotation   . MGUS (monoclonal gammopathy of unknown significance)   . Monoclonal paraproteinemia   . Obesity, unspecified   . Other specified congenital anomaly of kidney   . Other specified disorders of bladder   . Other testicular hypofunction   . Premature ejaculation   . Sleep apnea   . Ventral hernia      Transfusion: none   Consultants (if any):   Discharged Condition: Improved  Hospital Course: Chad Mccann is an 53 y.o. male who was admitted 03/02/2019 with a diagnosis of left hip osteoarthritis and went to the operating room on 03/02/2019 and underwent the above named procedures.    Surgeries: Procedure(s): TOTAL HIP ARTHROPLASTY ANTERIOR APPROACH-LEFT on  03/02/2019 Patient tolerated the surgery well. Taken to PACU where she was stabilized and then transferred to the orthopedic floor.  Started on Lovenox 40 mg q 24 hrs. Foot pumps applied bilaterally at 80 mm. Heels elevated on bed with rolled towels. No evidence of DVT. Negative [place]. Physical therapy started on day #1 for gait training and transfer. OT started day #1 for ADL and assisted devices.  Patient's foley was d/c on day #1. Patient's IVwas d/c on day #2.  On post op day #3 patient was stable and ready for discharge to home.  Implants: Medacta AMIS 3 standard stem with ceramic millimeters head, 58 mm Mpact DM cup and liner  He was given perioperative antibiotics:  Anti-infectives (From admission, onward)   Start     Dose/Rate Route Frequency Ordered Stop   03/02/19 1700  ceFAZolin (ANCEF) IVPB 2g/100 mL premix     2 g 200 mL/hr over 30 Minutes Intravenous Every 6 hours 03/02/19 1455 03/03/19 0545   03/01/19 2315  ceFAZolin (ANCEF) 3 g in dextrose 5 % 50 mL IVPB     3 g 100 mL/hr over 30 Minutes Intravenous  Once 03/01/19 2304 03/02/19 1139    .  He was given sequential compression devices, early ambulation, and Lovenox for DVT prophylaxis.  He benefited maximally from the hospital stay and there were no complications.    Recent vital signs:  Vitals:   03/03/19 2257 03/04/19 0821  BP: (!) 150/85 131/69  Pulse: 86 87  Resp: 19 16  Temp: 99.6 F (37.6 C) 98.1 F (36.7 C)  SpO2: 94% 99%  Recent laboratory studies:  Lab Results  Component Value Date   HGB 15.0 03/03/2019   HGB 16.5 03/02/2019   HGB 16.6 02/22/2019   Lab Results  Component Value Date   WBC 7.6 03/03/2019   PLT 165 03/03/2019   Lab Results  Component Value Date   INR 1.0 02/22/2019   Lab Results  Component Value Date   NA 135 03/03/2019   K 4.2 03/03/2019   CL 105 03/03/2019   CO2 24 03/03/2019   BUN 12 03/03/2019   CREATININE 1.11 03/03/2019   GLUCOSE 128 (H) 03/03/2019     Discharge Medications:   Allergies as of 03/04/2019      Reactions   Hydralazine Other (See Comments)   Nose bleeds/light headed   Morphine And Related Other (See Comments)   "difficulty waking patient"   Prednisone    hallucinations   Shellfish Allergy Nausea And Vomiting      Medication List    TAKE these medications   acetaminophen 325 MG tablet Commonly known as:  TYLENOL Take 1-2 tablets (325-650 mg total) by mouth every 6 (six) hours as needed for mild pain (pain score 1-3 or temp > 100.5).   amLODipine 10 MG tablet Commonly known as:  NORVASC Take 10 mg by mouth daily.   aspirin EC 81 MG tablet Take 81 mg by mouth daily.   atorvastatin 10 MG tablet Commonly known as:  LIPITOR Take 10 mg by mouth every evening.   clonazePAM 0.5 MG tablet Commonly known as:  KLONOPIN Take 0.5 mg by mouth at bedtime.   enoxaparin 40 MG/0.4ML injection Commonly known as:  LOVENOX Inject 0.4 mLs (40 mg total) into the skin daily for 14 days. Start taking on:  March 05, 2019   methocarbamol 500 MG tablet Commonly known as:  ROBAXIN Take 1 tablet (500 mg total) by mouth every 6 (six) hours as needed for muscle spasms.   metoprolol tartrate 100 MG tablet Commonly known as:  LOPRESSOR Take 100 mg by mouth 2 (two) times daily.   mycophenolate 250 MG capsule Commonly known as:  CELLCEPT Take 750 mg by mouth.   Oxycodone HCl 10 MG Tabs Take 1-1.5 tablets (10-15 mg total) by mouth every 3 (three) hours as needed for severe pain (pain score 7-10).   sildenafil 100 MG tablet Commonly known as:  VIAGRA TAKE 1 TABLET BY MOUTH EVERY DAY AS NEEDED FOR ERECTILE DYSFUNCTION   tacrolimus 1 MG capsule Commonly known as:  PROGRAF Take 2-3 mg by mouth See admin instructions. Take 3 capsules (3 mg) by mouth in the morning & take 2 capsules (2 mg) by mouth at night   tadalafil 20 MG tablet Commonly known as:  ADCIRCA/CIALIS Take 1 tablet (20 mg total) by mouth daily as needed for  erectile dysfunction.   testosterone cypionate 200 MG/ML injection Commonly known as:  DEPOTESTOSTERONE CYPIONATE Inject 1.5 mL into the muscle every 14 days What changed:    how much to take  how to take this  when to take this  additional instructions   traMADol 50 MG tablet Commonly known as:  ULTRAM Take 1 tablet (50 mg total) by mouth every 6 (six) hours as needed. What changed:    how much to take  when to take this  reasons to take this  additional instructions            Durable Medical Equipment  (From admission, onward)         Start  Ordered   03/03/19 1112  For home use only DME Bedside commode  Once    Question:  Patient needs a bedside commode to treat with the following condition  Answer:  Joint unsteady   03/03/19 1113          Diagnostic Studies: Dg Hip Operative Unilat W Or W/o Pelvis Left  Result Date: 03/02/2019 CLINICAL DATA:  53 year old male for left hip replacement. Initial encounter. EXAM: OPERATIVE left HIP (WITH PELVIS IF PERFORMED) 6 VIEWS TECHNIQUE: Fluoroscopic spot image(s) were submitted for interpretation post-operatively. COMPARISON:  09/26/2018 MR. FINDINGS: Left hip prosthesis placed. Only portion visualized on frontal imaging. Portion visualized without complication. This can be assessed on follow-up. IMPRESSION: Post left hip replacement. Electronically Signed   By: Genia Del M.D.   On: 03/02/2019 12:57   Dg Hip Unilat W Or W/o Pelvis 2-3 Views Left  Result Date: 03/02/2019 CLINICAL DATA:  53 year old male post left hip replacement. Subsequent encounter. EXAM: DG HIP (WITH OR WITHOUT PELVIS) 2-3V LEFT COMPARISON:  Intraoperative exam 03/02/2019. Preoperative MR 09/27/2015. FINDINGS: Post total left hip replacement which appears in satisfactory position without complication noted. Overlying structures slightly limit evaluation. IMPRESSION: Post total left hip replacement. Electronically Signed   By: Genia Del M.D.    On: 03/02/2019 13:34    Disposition:     Follow-up Information    Duanne Guess, PA-C Follow up in 2 week(s).   Specialties:  Orthopedic Surgery, Emergency Medicine Contact information: [place] [address] [phone]            Signed: Feliberto Gottron 03/04/2019, 11:46 AM

## 2019-03-05 LAB — SURGICAL PATHOLOGY

## 2019-03-05 NOTE — Progress Notes (Signed)
Pt ready for discharge home today per MD. Pt met PT goals and pt had large BM. Patient assessment unchanged from this morning. Pt's wife arrived and was concerned about redness around tegaderm dressing. Chris PA assessed this morning but was notified of wife's concerns and will come speak to her. Reviewed discharge instructions and prescriptions with pt and his wife; all questions answered and pt verbalized understanding. PIV removed, VSS.   Chad Mccann

## 2019-03-05 NOTE — Progress Notes (Signed)
Physical Therapy Treatment Patient Details Name: Chad Mccann MRN: [id number] DOB: August 11, 1966 Today's Date: 03/05/2019    History of Present Illness 53 year old male s/p L anterior THA. Pt endorses pmh of kidney transplant (2013).     PT Comments    Pt continues to struggle with hip flexion activities (needing AAROM for most related tasks) but motivated to do as much as he can; showed great effort during exercises.  Pt did well with ambulation and stair negotiation and showed good overall safety and function with functional tasks.   Pt continues to make gains despite c/o continued pain.  Follow Up Recommendations  Home health PT     Equipment Recommendations  None recommended by PT    Recommendations for Other Services       Precautions / Restrictions Precautions Precautions: Anterior Hip Restrictions LLE Weight Bearing: Partial weight bearing LLE Partial Weight Bearing Percentage or Pounds: 50%    Mobility  Bed Mobility Overal bed mobility: Modified Independent Bed Mobility: Sit to Supine       Sit to supine: Supervision   General bed mobility comments: Pt needed heavy UE assist to get L LE back into bed from sitting  Transfers Overall transfer level: Modified independent Equipment used: Rolling walker (2 wheeled) Transfers: Sit to/from Stand Sit to Stand: Modified independent (Device/Increase time)        Lateral/Scoot Transfers: Supervision General transfer comment: Pt needing small reminders for positioning, but able to rise and sit w/o physical assist  Ambulation/Gait Ambulation/Gait assistance: Min guard Gait Distance (Feet): 250 Feet Assistive device: Rolling walker (2 wheeled)       General Gait Details: Pt did well with ambulation again, stop-go cadence 2/2 PWBing/UE use need, but safe, confidence and w/o increased pain   Stairs Stairs: Yes Stairs assistance: Supervision Stair Management: No rails;Backwards;With walker Number of Stairs:  8 General stair comments: Pt did well with manangement of the walker and remebering proper sequencing, no LOBs or safety issues   Wheelchair Mobility    Modified Rankin (Stroke Patients Only)       Balance Overall balance assessment: Independent Sitting-balance support: Feet supported Sitting balance-[name] Scale: Good     Standing balance support: Bilateral upper extremity supported;During functional activity Standing balance-[name] Scale: Good Standing balance comment: reliant on walker, but safe                            Cognition Arousal/Alertness: Awake/alert Behavior During Therapy: WFL for tasks assessed/performed Overall Cognitive Status: Within Functional Limits for tasks assessed                                        Exercises Total Joint Exercises Ankle Circles/Pumps: AROM;10 reps Quad Sets: Strengthening;15 reps Heel Slides: AROM;10 reps Hip ABduction/ADduction: Strengthening;15 reps Long Arc Quad: Strengthening;10 reps Knee Flexion: Strengthening;10 reps Marching in Standing: Seated;AAROM;10 reps    General Comments        Pertinent Vitals/Pain Pain Assessment: 0-10 Pain Score: 7  Pain Location: L hip    Home Living                      Prior Function            PT Goals (current goals can now be found in the care plan section) Progress towards PT goals: Progressing toward goals  Frequency    BID      PT Plan Current plan remains appropriate    Co-evaluation              AM-PAC PT "6 Clicks" Mobility   Outcome Measure  Help needed turning from your back to your side while in a flat bed without using bedrails?: None Help needed moving from lying on your back to sitting on the side of a flat bed without using bedrails?: None Help needed moving to and from a bed to a chair (including a wheelchair)?: None Help needed standing up from a chair using your arms (e.g., wheelchair or bedside  chair)?: None Help needed to walk in hospital room?: None Help needed climbing 3-5 steps with a railing? : None 6 Click Score: 24    End of Session Equipment Utilized During Treatment: Gait belt Activity Tolerance: Patient tolerated treatment well Patient left: with bed alarm set;with call bell/phone within reach;with family/visitor present   PT Visit Diagnosis: Unsteadiness on feet (R26.81);Muscle weakness (generalized) (M62.81);Other abnormalities of gait and mobility (R26.89);Pain Pain - Right/Left: Left Pain - part of body: Hip     Time: 3546-5681 PT Time Calculation (min) (ACUTE ONLY): 32 min  Charges:  $Gait Training: 8-22 mins $Therapeutic Exercise: 8-22 mins                     Kreg Shropshire, DPT 03/05/2019, 10:14 AM

## 2019-03-05 NOTE — TOC Transition Note (Signed)
Transition of Care [facility]) - CM/SW Discharge Note   Patient Details  Name: Chad Mccann MRN: [id number] Date of Birth: [birth year]  Transition of [facility]) CM/SW Contact:  Shelbie Hutching, RN Phone Number: 03/05/2019, 11:05 AM   Clinical Narrative:    Patient is ready for discharge home today.  [name] with [facility] notified of discharge today.  [name] with [employer] has brought 3 in 1 to the bedside for patient to take home.  Patient reports that he already has a walker.  Patient will discharge with his wife.  He will go home with a Prevena Wound vac, which will be put on before discharge.  Patient and wife notified that their copay for the Lovenox will be $40 which is affordable for them.   Final next level of care: [name] Barriers to Discharge: No Barriers Identified   Patient Goals and CMS Choice Patient states their goals for this hospitalization and ongoing recovery are:: go home CMS Medicare.gov Compare Post Acute Care list provided to:: Patient Choice offered to / list presented to : Patient  Discharge Placement                       Discharge Plan and Services Discharge Planning Services: CM Consult Post Acute Care Choice: Home Health          DME Arranged: 3-N-1 DME Agency: [facility] HH Arranged: PT [place]: [facility] (now [facility])   Social Determinants of Health (SDOH) Interventions     Readmission Risk Interventions No flowsheet data found.

## 2019-03-05 NOTE — Progress Notes (Signed)
   Subjective: 3 Days Post-Op Procedure(s) (LRB): TOTAL HIP ARTHROPLASTY ANTERIOR APPROACH-LEFT (Left) Patient reports pain as mild.   Patient is well, and has had no acute complaints or problems Denies any CP, SOB, ABD pain. We will continue therapy today.  Plan is to go Home after hospital stay.  Objective: Vital signs in last 24 hours: Temp:  [98.1 F (36.7 C)-99.2 F (37.3 C)] 99.2 F (37.3 C) (03/13 0746) Pulse Rate:  [81-87] 81 (03/13 0746) Resp:  [16-17] 16 (03/13 0746) BP: (113-154)/(69-80) 113/73 (03/13 0746) SpO2:  [96 %-99 %] 96 % (03/13 0746)  Intake/Output from previous day: 03/12 0701 - 03/13 0700 In: 720 [P.O.:720] Out: -  Intake/Output this shift: No intake/output data recorded.  Recent Labs    03/02/19 1511 03/03/19 0815  HGB 16.5 15.0   Recent Labs    03/02/19 1511 03/03/19 0815  WBC 9.4 7.6  RBC 5.48 4.94  HCT 49.8 44.4  PLT 204 165   Recent Labs    03/02/19 1511 03/03/19 0815  NA  --  135  K  --  4.2  CL  --  105  CO2  --  24  BUN  --  12  CREATININE 1.20 1.11  GLUCOSE  --  128*  CALCIUM  --  8.1*   No results for input(s): LABPT, INR in the last 72 hours.  EXAM General - Patient is Alert, Appropriate and Oriented Extremity - Neurovascular intact Sensation intact distally Intact pulses distally Dorsiflexion/Plantar flexion intact No cellulitis present Compartment soft Dressing - dressing C/D/I and no drainage Praveena intact Motor Function - intact, moving foot and toes well on exam.   Past Medical History:  Diagnosis Date  . Benign prostatic hypertrophy   . Bladder spasm   . Erectile dysfunction   . Frequency   . GERD (gastroesophageal reflux disease)   . Hypertension   . Hypogonadism in male   . Kidney 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   . Kidney, malrotation   . MGUS (monoclonal gammopathy of unknown significance)   . Monoclonal paraproteinemia   . Obesity, unspecified   . Other specified congenital anomaly of kidney   .  Other specified disorders of bladder   . Other testicular hypofunction   . Premature ejaculation   . Sleep apnea   . Ventral hernia     Assessment/Plan:   3 Days Post-Op Procedure(s) (LRB): TOTAL HIP ARTHROPLASTY ANTERIOR APPROACH-LEFT (Left) Active Problems:   Status post total hip replacement, left  Estimated body mass index is 39.2 kg/m as calculated from the following:   Height as of this encounter: 5\' 10"  (1.778 m).   Weight as of this encounter: 123.9 kg. Advance diet Up with therapy  Vital signs are stable, encourage incentive spirometer Discharge home today pending bowel movement.  DVT Prophylaxis - Lovenox, TED hose and SCDs Partial weightbearing to left leg   T. Rachelle Hora, PA-C [place] 03/05/2019, 7:48 AM

## 2019-05-18 ENCOUNTER — Other Ambulatory Visit: Payer: Self-pay

## 2019-05-18 ENCOUNTER — Other Ambulatory Visit: Payer: 59

## 2019-05-18 ENCOUNTER — Ambulatory Visit: Payer: 59 | Admitting: Urology

## 2019-05-18 DIAGNOSIS — E291 Testicular hypofunction: Secondary | ICD-10-CM

## 2019-05-18 DIAGNOSIS — E349 Endocrine disorder, unspecified: Secondary | ICD-10-CM

## 2019-05-18 DIAGNOSIS — N401 Enlarged prostate with lower urinary tract symptoms: Secondary | ICD-10-CM

## 2019-05-18 DIAGNOSIS — N138 Other obstructive and reflux uropathy: Secondary | ICD-10-CM

## 2019-05-18 DIAGNOSIS — N529 Male erectile dysfunction, unspecified: Secondary | ICD-10-CM

## 2019-05-19 ENCOUNTER — Telehealth: Payer: Self-pay

## 2019-05-19 LAB — PSA: Prostate Specific Ag, Serum: 1.2 ng/mL (ref 0.0–4.0)

## 2019-05-19 LAB — TESTOSTERONE: Testosterone: >1500 ng/dL — ABNORMAL HIGH (ref 264–916)

## 2019-05-19 LAB — HEMATOCRIT: Hematocrit: 51 % (ref 37.5–51.0)

## 2019-05-19 LAB — HEMOGLOBIN: Hemoglobin: 17 g/dL (ref 13.0–17.7)

## 2019-05-19 NOTE — Telephone Encounter (Signed)
-----   Message from Nori Riis, PA-C sent at 05/19/2019  9:52 AM EDT ----- Would you let Mr. Comp know that his testosterone level is too high and we need to skip his next injection and then restart at 1.0 cc every two weeks?

## 2019-05-19 NOTE — Telephone Encounter (Signed)
Called pt, Left detailed message for pt informing him of the information below. Also sent my chart message, advised pt to call back for questions or concerns.

## 2019-05-26 NOTE — Progress Notes (Signed)
2:07 PM   CLARICE BONAVENTURE Nov 20, 1966 [id number]  Referring provider: [name], MD [address] [place], [place] [address]  Chief Complaint  Patient presents with  . Hypogonadism    HPI: Patient is a 53 year old Caucasian male with a history of renal transplant, erectile dysfunction and testosterone deficiency who presents today for six-month follow-up.    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  Renal ultrasound performed on 12/05/2015 noted native kidneys atrophic with cortical thinning an increased echotexture. No hydronephrosis. No hydronephrosis within the transplanted kidney. Followed at [facility] and was last seen in 01/2019.  Creatinine 1.08 on 02/23/2019.   Erectile dysfunction His SHIM score is 16, which is mild to moderate erectile dysfunction.   His previous SHIM score was 12.  He has been having difficulty with erections for the last several years.   His major complaint is maintaining an erections.  His libido is preserved.   His risk factors for ED are hyperlipidemia, HTN, testosterone deficiency and age.  He denies any painful erections or curvatures with his erections.  He would like a script for Viagra 100 mg daily.    SHIM    Row Name 05/27/19 1407         SHIM: Over the last 6 months:   How do you rate your confidence that you could get and keep an erection?  Moderate     When you had erections with sexual stimulation, how often were your erections hard enough for penetration (entering your partner)?  Sometimes (about half the time)     During sexual intercourse, how often were you able to maintain your erection after you had penetrated (entered) your partner?  Sometimes (about half the time)     During sexual intercourse, how difficult was it to maintain your erection to completion of intercourse?  Slightly Difficult     When you  attempted sexual intercourse, how often was it satisfactory for you?  Sometimes (about half the time)       SHIM Total Score   SHIM  16        Score: 1-7 Severe ED 8-11 Moderate ED 12-16 Mild-Moderate ED 17-21 Mild ED 22-25 No ED  Testosterone deficiency His pretreatment testosterone level was 116 ng/dL on 05/14/2012.  He is currently managing his testosterone deficiency with testosterone cypionate 200 mg/cc,  1.5 cc IM q 2 weeks.  He is not happy with this treatment regimen.  He is not having spontaneous erections.  He does sleep with a CPAP machine.    He gave his last injection two weeks.  His last testosterone was >1500 ng/dL on 04/2019.  HCT 51.0 % and Hbg 17.0 on 04/2019.    He states he missed his injection week, so when he gave himself an injection, he forgot to reset his calendar and gave himself two injections almost back to back.    BPH WITH LUTS I PSS score is 10/1.  He has no urinary complaints at this time.  He was given Flomax by his nephrologist as he complained of incomplete bladder emptying and frequency.  He states they are improved.  He did not take the Flomax as prescribed by nephrologist.  We did discuss taking it to see if his symptoms improved, but he deferred after we discussed the side effects.  His previous I PSS score was 6/0.  He denies  any dysuria, hematuria or suprapubic pain.  He also denies any recent fevers, chills, nausea or vomiting.  He does not have a family history of PCa. IPSS    Row Name 05/27/19 1300         International Prostate Symptom Score   How often have you had the sensation of not emptying your bladder?  Less than half the time     How often have you had to urinate less than every two hours?  Less than half the time     How often have you found you stopped and started again several times when you urinated?  Less than 1 in 5 times     How often have you found it difficult to postpone urination?  About half the time     How often have you  had a weak urinary stream?  Less than 1 in 5 times     How often have you had to strain to start urination?  Not at All     How many times did you typically get up at night to urinate?  1 Time     Total IPSS Score  10       Quality of Life due to urinary symptoms   If you were to spend the rest of your life with your urinary condition just the way it is now how would you feel about that?  Pleased        PMH: Past Medical History:  Diagnosis Date  . Benign prostatic hypertrophy   . Bladder spasm   . Erectile dysfunction   . Frequency   . GERD (gastroesophageal reflux disease)   . Hypertension   . Hypogonadism in male   . Kidney 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   . Kidney, malrotation   . MGUS (monoclonal gammopathy of unknown significance)   . Monoclonal paraproteinemia   . Obesity, unspecified   . Other specified congenital anomaly of kidney   . Other specified disorders of bladder   . Other testicular hypofunction   . Premature ejaculation   . Sleep apnea   . Ventral hernia     Surgical History: Past Surgical History:  Procedure Laterality Date  . BACK SURGERY     c5/6 fusion after discectomy 3 weeks prior  . COLONOSCOPY     polyp removal  . dialysis port placement     and removal  . HERNIA REPAIR  12/29/13   ventral  . KIDNEY TRANSPLANT  01/2012  . SPINE SURGERY     cervical fusion  . TOTAL HIP ARTHROPLASTY Left 03/02/2019   Procedure: TOTAL HIP ARTHROPLASTY ANTERIOR APPROACH-LEFT;  Surgeon: Hessie Knows, MD;  Location: [facility] ORS;  Service: Orthopedics;  Laterality: Left;    Home Medications:  Allergies as of 05/27/2019      Reactions   Hydralazine Other (See Comments)   Nose bleeds/light headed   Morphine And Related Other (See Comments)   "difficulty waking patient"   Prednisone    hallucinations   Shellfish Allergy Nausea And Vomiting      Medication List       Accurate as of May 27, 2019  2:07 PM. If you have any questions, ask your nurse or doctor.         STOP taking these medications   aspirin EC 81 MG tablet Stopped by:  Legrand Lasser, PA-C   enoxaparin 40 MG/0.4ML injection Commonly known as:  LOVENOX Stopped by:  Jaskiran Pata, PA-C   methocarbamol 500 MG tablet  Commonly known as:  ROBAXIN Stopped by:  Zara Council, PA-C   Oxycodone HCl 10 MG Tabs Stopped by:  Paton Crum, PA-C   traMADol 50 MG tablet Commonly known as:  ULTRAM Stopped by:  Zara Council, PA-C     TAKE these medications   acetaminophen 325 MG tablet Commonly known as:  TYLENOL Take 1-2 tablets (325-650 mg total) by mouth every 6 (six) hours as needed for mild pain (pain score 1-3 or temp > 100.5).   amLODipine 10 MG tablet Commonly known as:  NORVASC Take 10 mg by mouth daily.   atorvastatin 10 MG tablet Commonly known as:  LIPITOR Take 10 mg by mouth every evening.   clonazePAM 0.5 MG tablet Commonly known as:  KLONOPIN Take 0.5 mg by mouth at bedtime.   famotidine 40 MG tablet Commonly known as:  PEPCID Take by mouth.   metoprolol tartrate 100 MG tablet Commonly known as:  LOPRESSOR Take 100 mg by mouth 2 (two) times daily.   mycophenolate 250 MG capsule Commonly known as:  CELLCEPT Take 750 mg by mouth.   sildenafil 100 MG tablet Commonly known as:  VIAGRA Take 1 tablet (100 mg total) by mouth daily as needed for erectile dysfunction. What changed:    how much to take  how to take this  when to take this  reasons to take this  additional instructions Changed by:  Rosabella Edgin, PA-C   tacrolimus 1 MG capsule Commonly known as:  PROGRAF Take 2-3 mg by mouth See admin instructions. Take 3 capsules (3 mg) by mouth in the morning & take 2 capsules (2 mg) by mouth at night   tadalafil 20 MG tablet Commonly known as:  CIALIS Take 1 tablet (20 mg total) by mouth daily as needed for erectile dysfunction.   tamsulosin 0.4 MG Caps capsule Commonly known as:  FLOMAX   telmisartan 20 MG tablet Commonly known as:   MICARDIS   testosterone cypionate 200 MG/ML injection Commonly known as:  DEPOTESTOSTERONE CYPIONATE Inject 1.5 mL into the muscle every 14 days What changed:    how much to take  how to take this  when to take this  additional instructions       Allergies:  Allergies  Allergen Reactions  . Hydralazine Other (See Comments)    Nose bleeds/light headed  . Morphine And Related Other (See Comments)    "difficulty waking patient"  . Prednisone     hallucinations  . Shellfish Allergy Nausea And Vomiting    Family History: Family History  Problem Relation Age of Onset  . Colon cancer Father 81  . Heart attack Father   . Stroke Brother   . Colon cancer Maternal Grandfather   . Kidney disease Neg Hx   . Prostate cancer Neg Hx   . Kidney cancer Neg Hx   . Bladder Cancer Neg Hx     Social History:  reports that he has never smoked. He has never used smokeless tobacco. He reports current alcohol use. He reports that he does not use drugs.  ROS: UROLOGY Frequent Urination?: No Hard to postpone urination?: No Burning/pain with urination?: No Get up at night to urinate?: No Leakage of urine?: No Urine stream starts and stops?: No Trouble starting stream?: No Do you have to strain to urinate?: No Blood in urine?: No Urinary tract infection?: No Sexually transmitted disease?: No Injury to kidneys or bladder?: No Painful intercourse?: No Weak stream?: No Erection problems?: Yes Penile pain?: No  Gastrointestinal Nausea?: No Vomiting?: No Indigestion/heartburn?: Yes Diarrhea?: No Constipation?: No  Constitutional Fever: No Night sweats?: No Weight loss?: No Fatigue?: Yes  Skin Skin rash/lesions?: No Itching?: No  Eyes Blurred vision?: No Double vision?: No  Ears/Nose/Throat Sore throat?: No Sinus problems?: No  Hematologic/Lymphatic Swollen glands?: No Easy bruising?: No  Cardiovascular Leg swelling?: No Chest pain?: No  Respiratory  Cough?: No Shortness of breath?: No  Endocrine Excessive thirst?: No  Musculoskeletal Back pain?: Yes Joint pain?: No  Neurological Headaches?: No Dizziness?: No  Psychologic Depression?: No Anxiety?: No  Physical Exam: BP 123/74 (BP Location: Left Arm, Patient Position: Sitting, Cuff Size: Normal)   Pulse 67   Ht 5\' 10"  (1.778 m)   Wt 272 lb 4.8 oz (123.5 kg)   BMI 39.07 kg/m   Constitutional:  Well nourished. Alert and oriented, No acute distress. HEENT: [place] AT, moist mucus membranes.  Trachea midline, no masses. Cardiovascular: No clubbing, cyanosis, or edema. Respiratory: Normal respiratory effort, no increased work of breathing. GI: Abdomen is soft, non tender, non distended, no abdominal masses. Liver and spleen not palpable.  No hernias appreciated.  Stool sample for occult testing is not indicated.   GU: No CVA tenderness.  No bladder fullness or masses.  Patient with circumcised phallus.  Urethral meatus is patent.  No penile discharge. No penile lesions or rashes. Scrotum without lesions, cysts, rashes and/or edema.  Testicles are located scrotally bilaterally. No masses are appreciated in the testicles. Left and right epididymis are normal. Rectal: Patient with  normal sphincter tone. Anus and perineum without scarring or rashes. No rectal masses are appreciated. Prostate is approximately 35 grams, no nodules are appreciated. Seminal vesicles are normal. Skin: No rashes, bruises or suspicious lesions. Lymph: No cervical or inguinal adenopathy. Neurologic: Grossly intact, no focal deficits, moving all 4 extremities. Psychiatric: Normal mood and affect.   Laboratory Data: PSA History  0.9 ng/mL on 07/10/2015  0.9 ng/mL on 11/28/2015  0.7 ng/mL on 05/23/2016  0.6 ng/mL on 04/09/2017  0.8 ng/mL on 10/06/2017  1.1 ng/mL on 05/26/2018  1.2 ng/mL on 04/2019    Lab Results  Component Value Date   TESTOSTERONE >1500 (H) 05/18/2019   I have reviewed the labs.   Assessment & Plan:    1. History of renal transplant Followed by nephrology - last seen in 01/2019  2. Testosterone deficiency   Recent testosterone level is > 1500 ng/dL in May 2020 (therapeutic level 450-600 ng/dL) - due to an accidental over medicating with the injections - explained to patient that he needs to be very careful regarding timing his injections correctly as too much testosterone can lead to heart attacks He states that he is now on the correct schedule - will check testosterone today  3. BPH with LU TS I PSS score: 10/1 Continue conservative management, avoiding bladder irritants and timed voidings  Will hold on taking tamsulosin at this time  Return to clinic in 6 months for IPSS score, PSA and exam  RTC in 6 months for PSA and exam, as testosterone therapy can cause prostate enlargement and worsen LUTS  4. Erectile dysfunction:    SHIM score is 16, it is improving  Script given for Viagra 100 mg  RTC in 6 months for SHIM score and exam, as testosterone therapy can affect erections   Return in about 6 months (around 11/26/2019) for PSA, testosterone (one week after injection) H & H, IPSS, SHIM and exam.  These notes generated with voice  recognition software. I apologize for typographical errors.  Zara Council, PA-C  [facility] [address] [place], [place] [address] [phone]

## 2019-05-27 ENCOUNTER — Ambulatory Visit (INDEPENDENT_AMBULATORY_CARE_PROVIDER_SITE_OTHER): Payer: BC Managed Care – PPO | Admitting: Urology

## 2019-05-27 ENCOUNTER — Other Ambulatory Visit: Payer: Self-pay

## 2019-05-27 VITALS — BP 123/74 | HR 67 | Ht 70.0 in | Wt 272.3 lb

## 2019-05-27 DIAGNOSIS — E291 Testicular hypofunction: Secondary | ICD-10-CM

## 2019-05-27 DIAGNOSIS — N529 Male erectile dysfunction, unspecified: Secondary | ICD-10-CM

## 2019-05-27 DIAGNOSIS — N401 Enlarged prostate with lower urinary tract symptoms: Secondary | ICD-10-CM

## 2019-05-27 DIAGNOSIS — E349 Endocrine disorder, unspecified: Secondary | ICD-10-CM

## 2019-05-27 DIAGNOSIS — Z940 Kidney transplant status: Secondary | ICD-10-CM

## 2019-05-27 MED ORDER — SILDENAFIL CITRATE 100 MG PO TABS: 100.0000 mg | ORAL_TABLET | Freq: Every day | ORAL | 0 refills | Status: DC | PRN

## 2019-05-28 LAB — TESTOSTERONE: Testosterone: 689 ng/dL (ref 264–916)

## 2019-05-28 MED ORDER — TESTOSTERONE CYPIONATE 200 MG/ML IM SOLN: INTRAMUSCULAR | 0 refills | Status: DC

## 2019-05-31 ENCOUNTER — Encounter: Payer: Self-pay | Admitting: Urology

## 2019-07-27 ENCOUNTER — Other Ambulatory Visit: Payer: Self-pay

## 2019-07-27 DIAGNOSIS — Z20822 Contact with and (suspected) exposure to covid-19: Secondary | ICD-10-CM

## 2019-07-28 LAB — NOVEL CORONAVIRUS, NAA: SARS-CoV-2, NAA: NOT DETECTED

## 2019-08-19 ENCOUNTER — Telehealth: Payer: Self-pay | Admitting: Urology

## 2019-08-19 NOTE — Telephone Encounter (Signed)
Joy from [employer] called office and stated when they tried to fill pt's testosterone, his insurance company Nurse, mental health) didn't recognize Conservation officer, historic buildings.  Pt was told he needed to check with his ins company to make sure we are still in network.

## 2019-11-03 ENCOUNTER — Other Ambulatory Visit: Payer: Self-pay | Admitting: Family Medicine

## 2019-11-03 DIAGNOSIS — E349 Endocrine disorder, unspecified: Secondary | ICD-10-CM

## 2019-11-08 ENCOUNTER — Other Ambulatory Visit: Payer: BC Managed Care – PPO

## 2019-11-15 ENCOUNTER — Other Ambulatory Visit: Payer: Self-pay

## 2019-11-15 ENCOUNTER — Other Ambulatory Visit: Payer: BC Managed Care – PPO

## 2019-11-15 DIAGNOSIS — E349 Endocrine disorder, unspecified: Secondary | ICD-10-CM

## 2019-11-16 LAB — TESTOSTERONE: Testosterone: 638 ng/dL (ref 264–916)

## 2019-11-16 LAB — HEMATOCRIT: Hematocrit: 52.1 % — ABNORMAL HIGH (ref 37.5–51.0)

## 2019-11-16 LAB — HEMOGLOBIN: Hemoglobin: 16.9 g/dL (ref 13.0–17.7)

## 2019-11-16 LAB — PSA: Prostate Specific Ag, Serum: 1.1 ng/mL (ref 0.0–4.0)

## 2019-11-25 NOTE — Progress Notes (Signed)
8:51 AM   Chad Mccann 01-Jun-1966 [id number]  Referring provider: [name], MD [address] [place],  [place] [id number]  CC:  6 months follow up   HPI: Patient is a 53 year old male with a history of renal transplant, erectile dysfunction and testosterone deficiency who presents today for six-month follow-up.    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  RUS 02/2016 Stable RI's in the renal transplant arteries. No significant change compared with prior study.  Unchanged echogenic kidneys consistent with medical renal disease.  Followed at [facility] and was last seen in 01/2019.  Creatinine 1.08 on 02/23/2019.   Erectile dysfunction His SHIM score is 17, which is mild erectile dysfunction.   His previous SHIM score was 16.  He has been having difficulty with erections for the last several years.   His major complaint is maintaining an erections.  His libido is preserved.   His risk factors for ED are hyperlipidemia, HTN, testosterone deficiency and age.  He denies any painful erections or curvatures with his erections.  He finds the Cialis 20 mg daily.    SHIM    Row Name 11/26/19 0846         SHIM: Over the last 6 months:   How do you rate your confidence that you could get and keep an erection?  Moderate     When you had erections with sexual stimulation, how often were your erections hard enough for penetration (entering your partner)?  Sometimes (about half the time)     During sexual intercourse, how often were you able to maintain your erection after you had penetrated (entered) your partner?  Sometimes (about half the time)     During sexual intercourse, how difficult was it to maintain your erection to completion of intercourse?  Slightly Difficult     When you attempted sexual intercourse, how often was it satisfactory for you?  Most Times (much more  than half the time)       SHIM Total Score   SHIM  17        Score: 1-7 Severe ED 8-11 Moderate ED 12-16 Mild-Moderate ED 17-21 Mild ED 22-25 No ED  Testosterone deficiency His pretreatment testosterone level was 116 ng/dL on 05/14/2012.  He is currently managing his testosterone deficiency with testosterone cypionate 200 mg/cc,  1.5 cc IM q 2 weeks.  He is not having spontaneous erections.  He does sleep with a CPAP machine.     Component     Latest Ref Rng & Units 11/15/2019  Testosterone     264 - 916 ng/dL 638   Component     Latest Ref Rng & Units 11/15/2019  HCT     37.5 - 51.0 % 52.1 (H)   Component     Latest Ref Rng & Units 11/15/2019  Hemoglobin     13.0 - 17.7 g/dL 16.9    BPH WITH LUTS  (prostate and/or bladder) IPSS score: 8/2   Previous score: 10/1   Previous PVR: 78 mL  Major complaint(s):  Nocturia x 2.  Denies any dysuria, hematuria or suprapubic pain.   Currently taking: Nothing.  Flomax made him dizzy.    Denies any recent fevers, chills, nausea or vomiting.    IPSS    Row Name 11/26/19 0800         International Prostate Symptom Score  How often have you had the sensation of not emptying your bladder?  Less than 1 in 5     How often have you had to urinate less than every two hours?  Less than 1 in 5 times     How often have you found you stopped and started again several times when you urinated?  Less than 1 in 5 times     How often have you found it difficult to postpone urination?  About half the time     How often have you had a weak urinary stream?  Less than 1 in 5 times     How often have you had to strain to start urination?  Not at All     How many times did you typically get up at night to urinate?  1 Time     Total IPSS Score  8       Quality of Life due to urinary symptoms   If you were to spend the rest of your life with your urinary condition just the way it is now how would you feel about that?  Mostly Satisfied         Score:  1-7 Mild 8-19 Moderate 20-35 Severe   PMH: Past Medical History:  Diagnosis Date  . Benign prostatic hypertrophy   . Bladder spasm   . Erectile dysfunction   . Frequency   . GERD (gastroesophageal reflux disease)   . Hypertension   . Hypogonadism in male   . Kidney 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   . Kidney, malrotation   . MGUS (monoclonal gammopathy of unknown significance)   . Monoclonal paraproteinemia   . Obesity, unspecified   . Other specified congenital anomaly of kidney   . Other specified disorders of bladder   . Other testicular hypofunction   . Premature ejaculation   . Sleep apnea   . Ventral hernia     Surgical History: Past Surgical History:  Procedure Laterality Date  . BACK SURGERY     c5/6 fusion after discectomy 3 weeks prior  . COLONOSCOPY     polyp removal  . dialysis port placement     and removal  . HERNIA REPAIR  12/29/13   ventral  . KIDNEY TRANSPLANT  01/2012  . SPINE SURGERY     cervical fusion  . TOTAL HIP ARTHROPLASTY Left 03/02/2019   Procedure: TOTAL HIP ARTHROPLASTY ANTERIOR APPROACH-LEFT;  Surgeon: Hessie Knows, MD;  Location: [facility] ORS;  Service: Orthopedics;  Laterality: Left;    Home Medications:  Allergies as of 11/26/2019      Reactions   Hydralazine Other (See Comments)   Nose bleeds/light headed   Morphine And Related Other (See Comments)   "difficulty waking patient"   Prednisone    hallucinations   Shellfish Allergy Nausea And Vomiting      Medication List       Accurate as of November 26, 2019  8:51 AM. If you have any questions, ask your nurse or doctor.        STOP taking these medications   amLODipine 10 MG tablet Commonly known as: NORVASC Stopped by: Zara Council, PA-C     TAKE these medications   acetaminophen 325 MG tablet Commonly known as: TYLENOL Take 1-2 tablets (325-650 mg total) by mouth every 6 (six) hours as needed for mild pain (pain score 1-3 or temp > 100.5).   atorvastatin 10 MG  tablet Commonly known as: LIPITOR Take 10 mg by mouth every evening.  clonazePAM 0.5 MG tablet Commonly known as: KLONOPIN Take 0.5 mg by mouth at bedtime.   famotidine 40 MG tablet Commonly known as: PEPCID Take by mouth.   metoprolol tartrate 100 MG tablet Commonly known as: LOPRESSOR Take 100 mg by mouth 2 (two) times daily.   mycophenolate 250 MG capsule Commonly known as: CELLCEPT Take 750 mg by mouth.   sildenafil 100 MG tablet Commonly known as: VIAGRA Take 1 tablet (100 mg total) by mouth daily as needed for erectile dysfunction.   tacrolimus 1 MG capsule Commonly known as: PROGRAF Take 2-3 mg by mouth See admin instructions. Take 3 capsules (3 mg) by mouth in the morning & take 2 capsules (2 mg) by mouth at night   tadalafil 20 MG tablet Commonly known as: CIALIS Take 1 tablet (20 mg total) by mouth daily as needed for erectile dysfunction.   tamsulosin 0.4 MG Caps capsule Commonly known as: FLOMAX   telmisartan 20 MG tablet Commonly known as: MICARDIS   testosterone cypionate 200 MG/ML injection Commonly known as: DEPOTESTOSTERONE CYPIONATE Inject 1.5 mL into the muscle every 14 days       Allergies:  Allergies  Allergen Reactions  . Hydralazine Other (See Comments)    Nose bleeds/light headed  . Morphine And Related Other (See Comments)    "difficulty waking patient"  . Prednisone     hallucinations  . Shellfish Allergy Nausea And Vomiting    Family History: Family History  Problem Relation Age of Onset  . Colon cancer Father 20  . Heart attack Father   . Stroke Brother   . Colon cancer Maternal Grandfather   . Kidney disease Neg Hx   . Prostate cancer Neg Hx   . Kidney cancer Neg Hx   . Bladder Cancer Neg Hx     Social History:  reports that he has never smoked. He has never used smokeless tobacco. He reports current alcohol use. He reports that he does not use drugs.  ROS: UROLOGY Frequent Urination?: No Hard to postpone  urination?: No Burning/pain with urination?: No Get up at night to urinate?: No Leakage of urine?: No Urine stream starts and stops?: No Trouble starting stream?: No Do you have to strain to urinate?: No Blood in urine?: No Urinary tract infection?: No Sexually transmitted disease?: No Injury to kidneys or bladder?: No Painful intercourse?: No Weak stream?: No Erection problems?: No Penile pain?: No  Gastrointestinal Nausea?: No Vomiting?: No Indigestion/heartburn?: Yes Diarrhea?: No Constipation?: No  Constitutional Fever: No Night sweats?: No Weight loss?: No Fatigue?: No  Skin Skin rash/lesions?: No Itching?: No  Eyes Blurred vision?: No Double vision?: No  Ears/Nose/Throat Sore throat?: No Sinus problems?: No  Hematologic/Lymphatic Swollen glands?: No Easy bruising?: No  Cardiovascular Leg swelling?: No Chest pain?: No  Respiratory Cough?: No Shortness of breath?: No  Endocrine Excessive thirst?: No  Musculoskeletal Back pain?: No Joint pain?: No  Neurological Headaches?: No Dizziness?: No  Psychologic Depression?: No Anxiety?: No  Physical Exam: BP (!) 157/90   Pulse (!) 57   Ht 5\' 10"  (1.778 m)   Wt 245 lb 3.2 oz (111.2 kg)   BMI 35.18 kg/m   Constitutional:  Well nourished. Alert and oriented, No acute distress. HEENT: [place] AT, moist mucus membranes.  Trachea midline, no masses. Cardiovascular: No clubbing, cyanosis, or edema. Respiratory: Normal respiratory effort, no increased work of breathing. GI: Abdomen is soft, non tender, non distended, no abdominal masses. Liver and spleen not palpable.  No hernias  appreciated.  Stool sample for occult testing is not indicated.   GU: No CVA tenderness.  No bladder fullness or masses.  Patient with circumcised phallus.  Urethral meatus is patent.  No penile discharge. No penile lesions or rashes. Scrotum without lesions, cysts, rashes and/or edema.  Testicles are located scrotally  bilaterally. No masses are appreciated in the testicles. Left and right epididymis are normal. Rectal: Patient with  normal sphincter tone. Anus and perineum without scarring or rashes. No rectal masses are appreciated. Prostate is approximately 35 grams, no nodules are appreciated. Seminal vesicles could not be palpated.  Skin: No rashes, bruises or suspicious lesions. Lymph: No inguinal adenopathy. Neurologic: Grossly intact, no focal deficits, moving all 4 extremities. Psychiatric: Normal mood and affect.   Laboratory Data:  Component     Latest Ref Rng & Units 07/10/2015 11/28/2015 05/23/2016 04/09/2017  Prostate Specific Ag, Serum     0.0 - 4.0 ng/mL 0.9 0.9 0.7 0.6   Component     Latest Ref Rng & Units 10/06/2017 11/26/2017 05/26/2018 11/17/2018  Prostate Specific Ag, Serum     0.0 - 4.0 ng/mL 0.8 1.0 1.1 0.7   Component     Latest Ref Rng & Units 05/18/2019 11/15/2019  Prostate Specific Ag, Serum     0.0 - 4.0 ng/mL 1.2 1.1    Lab Results  Component Value Date   TESTOSTERONE 638 11/15/2019   I have reviewed the labs.  Assessment & Plan:    1. History of renal transplant Followed by nephrology - last seen in 01/2019  2. Testosterone deficiency   Recent testosterone level is 638 in November 2020 (therapeutic level 450-600 ng/dL) HCT is increasing RTC in HCT, hemoglobin and testosterone (one week after an injection)  3. BPH with LU TS I PSS score: 8/2, it is improved  Continue conservative management, avoiding bladder irritants and timed voidings  Return to clinic in 6 months for IPSS score, PSA and exam  RTC in 6 months for PSA and exam, as testosterone therapy can cause prostate enlargement and worsen LUTS  4. Erectile dysfunction:    SHIM score is 17, it is improving  Script given for Cialis 20 mg daily - refills sent to [place]  RTC in 6 months for SHIM score and exam, as testosterone therapy can affect erections   Return in about 3 months  (around 02/24/2020) for HCT, hemoglobin and testosterone level one week after an injection .  These notes generated with voice recognition software. I apologize for typographical errors.  Zara Council, PA-C  [facility] [address] [place], [place] [address] [phone]

## 2019-11-26 ENCOUNTER — Other Ambulatory Visit: Payer: Self-pay

## 2019-11-26 ENCOUNTER — Ambulatory Visit (INDEPENDENT_AMBULATORY_CARE_PROVIDER_SITE_OTHER): Payer: BC Managed Care – PPO | Admitting: Urology

## 2019-11-26 ENCOUNTER — Encounter: Payer: Self-pay | Admitting: Urology

## 2019-11-26 ENCOUNTER — Other Ambulatory Visit: Payer: Self-pay | Admitting: Urology

## 2019-11-26 ENCOUNTER — Telehealth: Payer: Self-pay | Admitting: Urology

## 2019-11-26 VITALS — BP 157/90 | HR 57 | Ht 70.0 in | Wt 245.2 lb

## 2019-11-26 DIAGNOSIS — N529 Male erectile dysfunction, unspecified: Secondary | ICD-10-CM

## 2019-11-26 DIAGNOSIS — N401 Enlarged prostate with lower urinary tract symptoms: Secondary | ICD-10-CM | POA: Diagnosis not present

## 2019-11-26 DIAGNOSIS — E291 Testicular hypofunction: Secondary | ICD-10-CM

## 2019-11-26 DIAGNOSIS — E349 Endocrine disorder, unspecified: Secondary | ICD-10-CM

## 2019-11-26 DIAGNOSIS — N138 Other obstructive and reflux uropathy: Secondary | ICD-10-CM

## 2019-11-26 DIAGNOSIS — Z940 Kidney transplant status: Secondary | ICD-10-CM | POA: Diagnosis not present

## 2019-11-26 MED ORDER — TESTOSTERONE CYPIONATE 200 MG/ML IM SOLN: INTRAMUSCULAR | 0 refills | Status: DC

## 2019-11-26 MED ORDER — TADALAFIL 20 MG PO TABS: 20.0000 mg | ORAL_TABLET | Freq: Every day | ORAL | 11 refills | Status: DC | PRN

## 2019-11-26 NOTE — Telephone Encounter (Signed)
[name] called office and needs RX that was sent for pt corrected.  It says 0 refills til 07/09/18.

## 2019-11-26 NOTE — Telephone Encounter (Signed)
Prescription corrected

## 2019-12-13 DIAGNOSIS — Z940 Kidney transplant status: Secondary | ICD-10-CM | POA: Diagnosis not present

## 2019-12-13 DIAGNOSIS — D849 Immunodeficiency, unspecified: Secondary | ICD-10-CM | POA: Diagnosis not present

## 2019-12-21 DIAGNOSIS — Z79899 Other long term (current) drug therapy: Secondary | ICD-10-CM | POA: Diagnosis not present

## 2019-12-21 DIAGNOSIS — B070 Plantar wart: Secondary | ICD-10-CM | POA: Diagnosis not present

## 2019-12-21 DIAGNOSIS — Z6835 Body mass index (BMI) 35.0-35.9, adult: Secondary | ICD-10-CM | POA: Diagnosis not present

## 2019-12-21 DIAGNOSIS — Z940 Kidney transplant status: Secondary | ICD-10-CM | POA: Diagnosis not present

## 2020-01-04 DIAGNOSIS — B070 Plantar wart: Secondary | ICD-10-CM | POA: Diagnosis not present

## 2020-02-01 DIAGNOSIS — D239 Other benign neoplasm of skin, unspecified: Secondary | ICD-10-CM | POA: Diagnosis not present

## 2020-02-01 DIAGNOSIS — L821 Other seborrheic keratosis: Secondary | ICD-10-CM | POA: Diagnosis not present

## 2020-02-01 DIAGNOSIS — B070 Plantar wart: Secondary | ICD-10-CM | POA: Diagnosis not present

## 2020-02-16 DIAGNOSIS — Z20822 Contact with and (suspected) exposure to covid-19: Secondary | ICD-10-CM | POA: Diagnosis not present

## 2020-02-24 ENCOUNTER — Other Ambulatory Visit: Payer: Self-pay

## 2020-02-24 DIAGNOSIS — E291 Testicular hypofunction: Secondary | ICD-10-CM

## 2020-02-25 ENCOUNTER — Other Ambulatory Visit: Payer: BC Managed Care – PPO

## 2020-02-25 ENCOUNTER — Other Ambulatory Visit: Payer: Self-pay

## 2020-02-25 DIAGNOSIS — E291 Testicular hypofunction: Secondary | ICD-10-CM

## 2020-02-26 ENCOUNTER — Emergency Department: Payer: BC Managed Care – PPO

## 2020-02-26 ENCOUNTER — Emergency Department
Admission: EM | Admit: 2020-02-26 | Discharge: 2020-02-26 | Disposition: A | Discharge status: Home / Self Care | Payer: BC Managed Care – PPO | Attending: Emergency Medicine | Admitting: Emergency Medicine

## 2020-02-26 ENCOUNTER — Encounter: Payer: Self-pay | Admitting: Emergency Medicine

## 2020-02-26 ENCOUNTER — Other Ambulatory Visit: Payer: Self-pay

## 2020-02-26 DIAGNOSIS — I10 Essential (primary) hypertension: Secondary | ICD-10-CM | POA: Insufficient documentation

## 2020-02-26 DIAGNOSIS — D751 Secondary polycythemia: Secondary | ICD-10-CM | POA: Diagnosis not present

## 2020-02-26 DIAGNOSIS — R0602 Shortness of breath: Secondary | ICD-10-CM | POA: Diagnosis not present

## 2020-02-26 DIAGNOSIS — R079 Chest pain, unspecified: Secondary | ICD-10-CM | POA: Insufficient documentation

## 2020-02-26 DIAGNOSIS — Z96642 Presence of left artificial hip joint: Secondary | ICD-10-CM | POA: Insufficient documentation

## 2020-02-26 DIAGNOSIS — R7989 Other specified abnormal findings of blood chemistry: Secondary | ICD-10-CM | POA: Diagnosis not present

## 2020-02-26 DIAGNOSIS — Z79899 Other long term (current) drug therapy: Secondary | ICD-10-CM | POA: Diagnosis not present

## 2020-02-26 DIAGNOSIS — Z940 Kidney transplant status: Secondary | ICD-10-CM | POA: Diagnosis not present

## 2020-02-26 LAB — CBC WITH DIFFERENTIAL/PLATELET
Abs Immature Granulocytes: 0.02 10*3/uL (ref 0.00–0.07)
Basophils Absolute: 0 10*3/uL (ref 0.0–0.1)
Basophils Relative: 1 %
Eosinophils Absolute: 0.2 10*3/uL (ref 0.0–0.5)
Eosinophils Relative: 3 %
HCT: 56.1 % — ABNORMAL HIGH (ref 39.0–52.0)
Hemoglobin: 19.1 g/dL — ABNORMAL HIGH (ref 13.0–17.0)
Immature Granulocytes: 0 %
Lymphocytes Relative: 20 %
Lymphs Abs: 0.9 10*3/uL (ref 0.7–4.0)
MCH: 30.8 pg (ref 26.0–34.0)
MCHC: 34 g/dL (ref 30.0–36.0)
MCV: 90.5 fL (ref 80.0–100.0)
Monocytes Absolute: 0.4 10*3/uL (ref 0.1–1.0)
Monocytes Relative: 8 %
Neutro Abs: 3.2 10*3/uL (ref 1.7–7.7)
Neutrophils Relative %: 68 %
Platelets: 204 10*3/uL (ref 150–400)
RBC: 6.2 MIL/uL — ABNORMAL HIGH (ref 4.22–5.81)
RDW: 12.9 % (ref 11.5–15.5)
WBC: 4.8 10*3/uL (ref 4.0–10.5)
nRBC: 0 % (ref 0.0–0.2)

## 2020-02-26 LAB — TROPONIN I (HIGH SENSITIVITY)
Troponin I (High Sensitivity): 12 ng/L (ref <18)
Troponin I (High Sensitivity): 12 ng/L (ref <18)

## 2020-02-26 LAB — URINALYSIS, COMPLETE (UACMP) WITH MICROSCOPIC
Bacteria, UA: NONE SEEN
Bilirubin Urine: NEGATIVE
Glucose, UA: NEGATIVE mg/dL
Hgb urine dipstick: NEGATIVE
Ketones, ur: NEGATIVE mg/dL
Leukocytes,Ua: NEGATIVE
Nitrite: NEGATIVE
Protein, ur: 30 mg/dL — AB
Specific Gravity, Urine: 1.016 (ref 1.005–1.030)
Squamous Epithelial / HPF: NONE SEEN (ref 0–5)
WBC, UA: NONE SEEN WBC/hpf (ref 0–5)
pH: 6 (ref 5.0–8.0)

## 2020-02-26 LAB — BASIC METABOLIC PANEL
Anion gap: 10 (ref 5–15)
BUN: 20 mg/dL (ref 6–20)
CO2: 23 mmol/L (ref 22–32)
Calcium: 10 mg/dL (ref 8.9–10.3)
Chloride: 104 mmol/L (ref 98–111)
Creatinine, Ser: 1.08 mg/dL (ref 0.61–1.24)
GFR calc Af Amer: >60 mL/min (ref >60)
GFR calc non Af Amer: >60 mL/min (ref >60)
Glucose, Bld: 103 mg/dL — ABNORMAL HIGH (ref 70–99)
Potassium: 4.7 mmol/L (ref 3.5–5.1)
Sodium: 137 mmol/L (ref 135–145)

## 2020-02-26 LAB — HEPATIC FUNCTION PANEL
ALT: 17 U/L (ref 0–44)
AST: 14 U/L — ABNORMAL LOW (ref 15–41)
Albumin: 4.6 g/dL (ref 3.5–5.0)
Alkaline Phosphatase: 60 U/L (ref 38–126)
Bilirubin, Direct: 0.2 mg/dL (ref 0.0–0.2)
Indirect Bilirubin: 1.2 mg/dL — ABNORMAL HIGH (ref 0.3–0.9)
Total Bilirubin: 1.4 mg/dL — ABNORMAL HIGH (ref 0.3–1.2)
Total Protein: 7.8 g/dL (ref 6.5–8.1)

## 2020-02-26 LAB — PROTIME-INR
INR: 1 (ref 0.8–1.2)
Prothrombin Time: 13.4 seconds (ref 11.4–15.2)

## 2020-02-26 LAB — HEMOGLOBIN: Hemoglobin: 20 g/dL (ref 13.0–17.7)

## 2020-02-26 LAB — IRON AND TIBC
Iron: 131 ug/dL (ref 45–182)
Saturation Ratios: 33 % (ref 17.9–39.5)
TIBC: 395 ug/dL (ref 250–450)
UIBC: 264 ug/dL

## 2020-02-26 LAB — HEMATOCRIT: Hematocrit: 57.7 % — ABNORMAL HIGH (ref 37.5–51.0)

## 2020-02-26 LAB — TESTOSTERONE: Testosterone: 502 ng/dL (ref 264–916)

## 2020-02-26 LAB — FERRITIN: Ferritin: 27 ng/mL (ref 24–336)

## 2020-02-26 MED ORDER — TRAMADOL HCL 50 MG PO TABS
50.0000 mg | ORAL_TABLET | Freq: Once | ORAL | Status: AC
  Administered 2020-02-26: 50 mg via ORAL
  Filled 2020-02-26: qty 1

## 2020-02-26 MED ORDER — TRAMADOL HCL 50 MG PO TABS: 50.0000 mg | ORAL_TABLET | Freq: Four times a day (QID) | ORAL | 0 refills | Status: AC | PRN

## 2020-02-26 NOTE — ED Notes (Signed)
Dr Cherylann Banas aware of pt's concerns of flashing in his eye- Dr Cherylann Banas states we will hold discharge and observe for approximately 20 minutes

## 2020-02-26 NOTE — ED Triage Notes (Signed)
Pt to ED via POV c/o abnormal lab result. Pt states that he had labs done yesterday at [place] and he got a call this morning saying that he needed to come to the ED because his Hgb and Hct were very elevated. Pt is currently in NAD.

## 2020-02-26 NOTE — Discharge Instructions (Signed)
You should receive a call from Dr. Gary Fleet office on Monday to schedule an appointment within the next few days.  If you do not hear by the end of the day Monday, you can call the office at the number included.  Return to the ER for new, worsening, or persistent weakness, chest pain, shortness of breath, lightheadedness, abnormal bleeding or bruising, or any other new or worsening symptoms that concern you.

## 2020-02-26 NOTE — ED Notes (Signed)
Theraputic phlebotomy complete- 419ml of blood removed

## 2020-02-26 NOTE — ED Provider Notes (Signed)
[facility] Emergency Department Provider Note ____________________________________________   First MD Initiated Contact with Patient 02/26/20 [phone]     (approximate)  I have reviewed the triage vital signs and the nursing notes.   HISTORY  Chief Complaint Abnormal Lab    HPI Chad Mccann is a 54 y.o. male with PMH as noted below who presents for evaluation after he was noted to have abnormal labs during an outpatient urology visit yesterday.  The patient states that his hemoglobin and hematocrit are elevated and he was instructed to come to the hospital for further evaluation.  The patient reports bilateral shoulder pain which is atraumatic.  He states he had an episode of chest pain in the lower sternal area yesterday which has resolved, and also had an episode of lightheadedness which lasted around a minute.  The patient reports mild shortness of breath with exertion.  He has a history of MGUS but denies any prior history of polycythemia.  Past Medical History:  Diagnosis Date  . Benign prostatic hypertrophy   . Bladder spasm   . Erectile dysfunction   . Frequency   . GERD (gastroesophageal reflux disease)   . Hypertension   . Hypogonadism in male   . Kidney 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   . Kidney, malrotation   . MGUS (monoclonal gammopathy of unknown significance)   . Monoclonal paraproteinemia   . Obesity, unspecified   . Other specified congenital anomaly of kidney   . Other specified disorders of bladder   . Other testicular hypofunction   . Premature ejaculation   . Sleep apnea   . Ventral hernia     Patient Active Problem List   Diagnosis Date Noted  . Status post total hip replacement, left 03/02/2019  . Strain of knee 08/17/2018  . Abdominal pain 02/11/2016  . Notalgia 12/10/2015  . History of transplantation, renal 12/10/2015  . Acid reflux 07/10/2015  . 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 BPH with obstruction/lower urinary tract symptoms  07/10/2015  . Hypogonadism in male 07/10/2015  . Erectile dysfunction of organic origin 07/10/2015  . Ventral hernia 12/05/2013  . MGUS (monoclonal gammopathy of unknown significance) 06/28/2013  . Focal and segmental hyalinosis 04/21/2013  . H/O kidney transplant 01/28/2012  . Adiposity 02/12/2011  . Obstructive apnea 02/12/2011  . Essential (primary) hypertension 09/05/2010    Past Surgical History:  Procedure Laterality Date  . BACK SURGERY     c5/6 fusion after discectomy 3 weeks prior  . COLONOSCOPY     polyp removal  . dialysis port placement     and removal  . HERNIA REPAIR  12/29/13   ventral  . KIDNEY TRANSPLANT  01/2012  . SPINE SURGERY     cervical fusion  . TOTAL HIP ARTHROPLASTY Left 03/02/2019   Procedure: TOTAL HIP ARTHROPLASTY ANTERIOR APPROACH-LEFT;  Surgeon: Hessie Knows, MD;  Location: [facility] ORS;  Service: Orthopedics;  Laterality: Left;    Prior to Admission medications   Medication Sig Start Date End Date Taking? Authorizing Provider  acetaminophen (TYLENOL) 325 MG tablet Take 1-2 tablets (325-650 mg total) by mouth every 6 (six) hours as needed for mild pain (pain score 1-3 or temp > 100.5). 03/04/19   Duanne Guess, PA-C  atorvastatin (LIPITOR) 10 MG tablet Take 10 mg by mouth every evening.     [provider]  clonazePAM (KLONOPIN) 0.5 MG tablet Take 0.5 mg by mouth at bedtime.     [provider]  famotidine (PEPCID) 40 MG tablet  Take by mouth. 04/08/19 04/07/20  [provider]  metoprolol tartrate (LOPRESSOR) 100 MG tablet Take 100 mg by mouth 2 (two) times daily.    [provider]  mycophenolate (CELLCEPT) 250 MG capsule Take 750 mg by mouth. 10/03/17   [provider]  sildenafil (VIAGRA) 100 MG tablet Take 1 tablet (100 mg total) by mouth daily as needed for erectile dysfunction. 05/27/19   Zara Council A, PA-C  tacrolimus (PROGRAF) 1 MG capsule Take 2-3 mg by mouth See admin instructions. Take 3 capsules  (3 mg) by mouth in the morning & take 2 capsules (2 mg) by mouth at night    [provider]  tadalafil (CIALIS) 20 MG tablet Take 1 tablet (20 mg total) by mouth daily as needed for erectile dysfunction. 11/26/19   Zara Council A, PA-C  tamsulosin (FLOMAX) 0.4 MG CAPS capsule  02/16/19   [provider]  telmisartan (MICARDIS) 20 MG tablet  02/16/19   [provider]  testosterone cypionate (DEPOTESTOSTERONE CYPIONATE) 200 MG/ML injection Inject 1.5 mL into the muscle every 14 days 11/26/19   Zara Council A, PA-C  traMADol (ULTRAM) 50 MG tablet Take 1 tablet (50 mg total) by mouth every 6 (six) hours as needed for up to 5 days. 02/26/20 03/02/20  Arta Silence, MD    Allergies Hydralazine, Morphine and related, Prednisone, and Shellfish allergy  Family History  Problem Relation Age of Onset  . Colon cancer Father 55  . Heart attack Father   . Stroke Brother   . Colon cancer Maternal Grandfather   . Kidney disease Neg Hx   . Prostate cancer Neg Hx   . Kidney cancer Neg Hx   . Bladder Cancer Neg Hx     Social History Social History   Tobacco Use  . Smoking status: Never Smoker  . Smokeless tobacco: Never Used  Substance Use Topics  . Alcohol use: Yes    Alcohol/week: 0.0 standard drinks    Comment: occ  . Drug use: No    Review of Systems  Constitutional: No fever. Eyes: No redness. ENT: No sore throat. Cardiovascular: Positive for resolved chest pain. Respiratory: Positive for intermittent shortness of breath. Gastrointestinal: No vomiting or diarrhea.  Genitourinary: Negative for dysuria or hematuria.  Musculoskeletal: Negative for back pain. Skin: Negative for rash. Neurological: Negative for headache.   ____________________________________________   PHYSICAL EXAM:  VITAL SIGNS: ED Triage Vitals  Enc Vitals Group     BP 02/26/20 0840 (!) 206/101     Pulse Rate 02/26/20 0840 65     Resp 02/26/20 0840 18     Temp 02/26/20  0840 98.1 F (36.7 C)     Temp Source 02/26/20 0840 Oral     SpO2 02/26/20 0840 95 %     Weight 02/26/20 0844 240 lb (108.9 kg)     Height 02/26/20 0844 5\' 10"  (1.778 m)     Head Circumference --      Peak Flow --      Pain Score 02/26/20 0844 7     Pain Loc --      Pain Edu? --      Excl. in Black? --     Constitutional: Alert and oriented. Well appearing and in no acute distress. Eyes: Conjunctivae are normal.  Head: Atraumatic. Nose: No congestion/rhinnorhea. Mouth/Throat: Mucous membranes are moist.   Neck: Normal range of motion.  Cardiovascular: Normal rate, regular rhythm.  Good peripheral circulation. Respiratory: Normal respiratory effort.  No  retractions. Gastrointestinal: Soft and nontender. No distention.  Genitourinary: No flank tenderness. Musculoskeletal: Extremities warm and well perfused.  Neurologic:  Normal speech and language. No gross focal neurologic deficits are appreciated.  Skin:  Skin is warm and dry. No rash noted. Psychiatric: Mood and affect are normal. Speech and behavior are normal.  ____________________________________________   LABS (all labs ordered are listed, but only abnormal results are displayed)  Labs Reviewed  BASIC METABOLIC PANEL - Abnormal; Notable for the following components:      Result Value   Glucose, Bld 103 (*)    All other components within normal limits  CBC WITH DIFFERENTIAL/PLATELET - Abnormal; Notable for the following components:   RBC 6.20 (*)    Hemoglobin 19.1 (*)    HCT 56.1 (*)    All other components within normal limits  HEPATIC FUNCTION PANEL - Abnormal; Notable for the following components:   AST 14 (*)    Total Bilirubin 1.4 (*)    Indirect Bilirubin 1.2 (*)    All other components within normal limits  URINALYSIS, COMPLETE (UACMP) WITH MICROSCOPIC - Abnormal; Notable for the following components:   Color, Urine YELLOW (*)    APPearance CLEAR (*)    Protein, ur 30 (*)    All other components within  normal limits  PROTIME-INR  FERRITIN  IRON AND TIBC  ERYTHROPOIETIN  JAK2  V617F QUAL. WITH REFLEX TO EXON 12  TROPONIN I (HIGH SENSITIVITY)  TROPONIN I (HIGH SENSITIVITY)   ____________________________________________  EKG  ED ECG REPORT I, Arta Silence, the attending physician, personally viewed and interpreted this ECG.  Date: 02/26/2020 EKG Time: 1009 Rate: 65 Rhythm: normal sinus rhythm QRS Axis: normal Intervals: normal ST/T Wave abnormalities: Nonspecific ST abnormalities inferior and lateral Narrative Interpretation: Nonspecific abnormalities with no evidence of acute ischemia; no significant change when compared to EKG of 02/22/2019  ____________________________________________  RADIOLOGY  CXR: No focal infiltrate or other acute abnormality  ____________________________________________   PROCEDURES  Procedure(s) performed: No  Procedures  Critical Care performed: No ____________________________________________   INITIAL IMPRESSION / ASSESSMENT AND PLAN / ED COURSE  Pertinent labs & imaging results that were available during my care of the patient were reviewed by me and considered in my medical decision making (see chart for details).  54 year old male with PMH as noted above including history of MGUS and kidney transplant presents with elevated hemoglobin and hematocrit noted on outpatient labs yesterday.  The patient reports an episode of chest pain and a separate episode of dizziness yesterday, and has had some exertional shortness of breath.  He denies any prior history of polycythemia  I reviewed the past medical records in Epic.  The patient was last admitted for a total hip arthroplasty 1 year ago.  He follows with nephrology after a kidney transplant in 2013.  On exam, the patient is overall well-appearing.  He was significantly hypertensive at triage although this is improved without intervention.  His other vital signs are normal.  The  physical exam is otherwise unremarkable.  The patient's labs from yesterday suggest polycythemia.  We will obtain a repeat CBC as well as chemistries, urinalysis, EPO level, and a troponin due to the patient's episode of chest pain.  I will then plan to consult hematology.  ----------------------------------------- 4:11 PM on 02/26/2020 -----------------------------------------  Lab work-up was unremarkable except for the polycythemia.  I consulted Dr. Grayland Ormond from hematology who recommended some additional labs for further work-up, and advised that the patient should have a therapeutic  phlebotomy of 500 cc.  He advised that the patient did not require repeat blood work afterwards, but should follow-up in his office in the next several days.  I forwarded the patient's information to Dr. Grayland Ormond.  The patient tolerated the phlebotomy without complications.  His vital signs remained stable.  I instructed him on the follow-up plan and gave him thorough return precautions and he expressed understanding. ____________________________________________   FINAL CLINICAL IMPRESSION(S) / ED DIAGNOSES  Final diagnoses:  Polycythemia      NEW MEDICATIONS STARTED DURING THIS VISIT:  Discharge Medication List as of 02/26/2020  2:13 PM    START taking these medications   Details  traMADol (ULTRAM) 50 MG tablet Take 1 tablet (50 mg total) by mouth every 6 (six) hours as needed for up to 5 days., Starting Sat 02/26/2020, Until Thu 03/02/2020, Normal         Note:  This document was prepared using Dragon voice recognition software and may include unintentional dictation errors.    Arta Silence, MD 02/26/20 [phone]

## 2020-02-26 NOTE — ED Notes (Signed)
Pt states he is feeling better and Dr Cherylann Banas states he is clear for discharge

## 2020-02-26 NOTE — ED Notes (Signed)
Lt green and dark green tubes sent to lab

## 2020-02-26 NOTE — ED Notes (Signed)
Therapeutic phlebotomy bag connected to patient

## 2020-02-28 ENCOUNTER — Telehealth: Payer: Self-pay

## 2020-02-28 LAB — ERYTHROPOIETIN: Erythropoietin: 9.7 m[IU]/mL (ref 2.6–18.5)

## 2020-02-28 NOTE — Telephone Encounter (Signed)
Patient states he is waiting on a call from Dr. Gary Fleet office and will not inject testosterone at this time

## 2020-02-28 NOTE — Telephone Encounter (Signed)
-----   Message from Nori Riis, PA-C sent at 02/28/2020  7:48 AM EST ----- Please call Mr. Corrie and have him schedule a follow up with Dr. Grayland Ormond for his elevated hemoglobin.   Also, remind him not to inject anymore testosterone at this time.

## 2020-03-03 LAB — JAK2 EXONS 12-15

## 2020-03-03 LAB — JAK2  V617F QUAL. WITH REFLEX TO EXON 12: Reflex:: 15

## 2020-03-06 DIAGNOSIS — Q6102 Congenital multiple renal cysts: Secondary | ICD-10-CM | POA: Diagnosis not present

## 2020-03-06 DIAGNOSIS — D751 Secondary polycythemia: Secondary | ICD-10-CM | POA: Diagnosis not present

## 2020-03-06 DIAGNOSIS — Z940 Kidney transplant status: Secondary | ICD-10-CM | POA: Diagnosis not present

## 2020-03-06 DIAGNOSIS — N281 Cyst of kidney, acquired: Secondary | ICD-10-CM | POA: Diagnosis not present

## 2020-03-14 DIAGNOSIS — B070 Plantar wart: Secondary | ICD-10-CM | POA: Diagnosis not present

## 2020-03-27 DIAGNOSIS — N051 Unspecified nephritic syndrome with focal and segmental glomerular lesions: Secondary | ICD-10-CM | POA: Diagnosis not present

## 2020-03-27 DIAGNOSIS — Z87891 Personal history of nicotine dependence: Secondary | ICD-10-CM | POA: Diagnosis not present

## 2020-03-27 DIAGNOSIS — G4733 Obstructive sleep apnea (adult) (pediatric): Secondary | ICD-10-CM | POA: Diagnosis not present

## 2020-03-27 DIAGNOSIS — D582 Other hemoglobinopathies: Secondary | ICD-10-CM | POA: Diagnosis not present

## 2020-03-27 DIAGNOSIS — Z940 Kidney transplant status: Secondary | ICD-10-CM | POA: Diagnosis not present

## 2020-03-27 DIAGNOSIS — R61 Generalized hyperhidrosis: Secondary | ICD-10-CM | POA: Diagnosis not present

## 2020-03-27 DIAGNOSIS — R634 Abnormal weight loss: Secondary | ICD-10-CM | POA: Diagnosis not present

## 2020-03-27 DIAGNOSIS — D472 Monoclonal gammopathy: Secondary | ICD-10-CM | POA: Diagnosis not present

## 2020-03-27 DIAGNOSIS — I10 Essential (primary) hypertension: Secondary | ICD-10-CM | POA: Diagnosis not present

## 2020-04-10 ENCOUNTER — Telehealth: Payer: Self-pay

## 2020-04-10 NOTE — Telephone Encounter (Signed)
Patient called stating that his hemoglobin is now back to normal (labs in care everywhere) He wanted to know he if could restart testosterone? Please Tacey Heap

## 2020-04-10 NOTE — Telephone Encounter (Signed)
mychart sent.

## 2020-04-10 NOTE — Telephone Encounter (Signed)
We will need medical clearance from his hematologist prior to restarting his testosterone injections.

## 2020-04-17 DIAGNOSIS — R634 Abnormal weight loss: Secondary | ICD-10-CM | POA: Diagnosis not present

## 2020-04-17 DIAGNOSIS — R161 Splenomegaly, not elsewhere classified: Secondary | ICD-10-CM | POA: Diagnosis not present

## 2020-04-17 DIAGNOSIS — R61 Generalized hyperhidrosis: Secondary | ICD-10-CM | POA: Diagnosis not present

## 2020-04-18 DIAGNOSIS — D751 Secondary polycythemia: Secondary | ICD-10-CM | POA: Diagnosis not present

## 2020-04-18 DIAGNOSIS — D472 Monoclonal gammopathy: Secondary | ICD-10-CM | POA: Diagnosis not present

## 2020-04-20 ENCOUNTER — Other Ambulatory Visit: Payer: Self-pay

## 2020-04-20 DIAGNOSIS — E291 Testicular hypofunction: Secondary | ICD-10-CM

## 2020-04-21 ENCOUNTER — Other Ambulatory Visit: Payer: Self-pay | Admitting: Urology

## 2020-04-21 DIAGNOSIS — E291 Testicular hypofunction: Secondary | ICD-10-CM

## 2020-04-21 MED ORDER — TESTOSTERONE CYPIONATE 200 MG/ML IM SOLN: INTRAMUSCULAR | 0 refills | Status: DC

## 2020-04-21 NOTE — Progress Notes (Signed)
I have sent in a script for his testosterone, but he needs to restart his injections at 0.75 cc every 14 days.  We then need to check his hgb, HCT and testosterone in one month.   Patient notified and voiced understanding. Lab orders are in and appointment has been made.

## 2020-04-24 ENCOUNTER — Other Ambulatory Visit: Payer: Self-pay | Admitting: Family Medicine

## 2020-04-24 DIAGNOSIS — E291 Testicular hypofunction: Secondary | ICD-10-CM

## 2020-05-26 ENCOUNTER — Telehealth: Payer: Self-pay | Admitting: Urology

## 2020-05-26 NOTE — Telephone Encounter (Signed)
Please call [name] and remind him that we need to check his testosterone, hemoglobin and hematocrit levels.

## 2020-06-01 ENCOUNTER — Other Ambulatory Visit: Payer: Self-pay

## 2020-06-02 ENCOUNTER — Other Ambulatory Visit: Payer: BC Managed Care – PPO

## 2020-06-02 ENCOUNTER — Other Ambulatory Visit: Payer: Self-pay

## 2020-06-02 DIAGNOSIS — E291 Testicular hypofunction: Secondary | ICD-10-CM

## 2020-06-02 NOTE — Telephone Encounter (Signed)
Labs have been done.

## 2020-06-03 LAB — TESTOSTERONE: Testosterone: 226 ng/dL — ABNORMAL LOW (ref 264–916)

## 2020-06-03 LAB — HEMATOCRIT: Hematocrit: 49.5 % (ref 37.5–51.0)

## 2020-06-03 LAB — HEMOGLOBIN: Hemoglobin: 17.1 g/dL (ref 13.0–17.7)

## 2020-06-05 ENCOUNTER — Telehealth: Payer: Self-pay | Admitting: Family Medicine

## 2020-06-05 NOTE — Telephone Encounter (Signed)
Patient notified and lab appointment scheduled. 

## 2020-06-05 NOTE — Telephone Encounter (Signed)
-----   Message from Nori Riis, PA-C sent at 06/04/2020  3:26 PM EDT ----- Please let Mr. Selner know that his hematocrit and hemoglobin are within normal limit.  His hemoglobin is at the upper range of normal, so I suggest that he continue with his present dose.  I would like to see him in one month with HCT, hemoglobin, PSA and testosterone level.

## 2020-06-07 DIAGNOSIS — Z79899 Other long term (current) drug therapy: Secondary | ICD-10-CM | POA: Diagnosis not present

## 2020-06-07 DIAGNOSIS — Z940 Kidney transplant status: Secondary | ICD-10-CM | POA: Diagnosis not present

## 2020-06-07 DIAGNOSIS — Z6835 Body mass index (BMI) 35.0-35.9, adult: Secondary | ICD-10-CM | POA: Diagnosis not present

## 2020-06-15 DIAGNOSIS — I10 Essential (primary) hypertension: Secondary | ICD-10-CM | POA: Diagnosis not present

## 2020-06-15 DIAGNOSIS — Z940 Kidney transplant status: Secondary | ICD-10-CM | POA: Diagnosis not present

## 2020-07-10 ENCOUNTER — Other Ambulatory Visit: Payer: Self-pay

## 2020-07-10 DIAGNOSIS — E349 Endocrine disorder, unspecified: Secondary | ICD-10-CM

## 2020-07-11 ENCOUNTER — Encounter: Payer: Self-pay | Admitting: Urology

## 2020-07-24 ENCOUNTER — Telehealth: Payer: Self-pay | Admitting: Urology

## 2020-07-24 NOTE — Telephone Encounter (Signed)
Would you call Mr. Chad Mccann and remind him that we still need his labs repeated, his hematocrit, hemoglobin and testosterone level?

## 2020-07-24 NOTE — Telephone Encounter (Signed)
LMOM for patient to return call and schedule a lab appointment.  

## 2020-08-17 ENCOUNTER — Other Ambulatory Visit: Payer: BC Managed Care – PPO

## 2020-08-17 ENCOUNTER — Other Ambulatory Visit: Payer: Self-pay

## 2020-08-17 DIAGNOSIS — E349 Endocrine disorder, unspecified: Secondary | ICD-10-CM | POA: Diagnosis not present

## 2020-08-18 ENCOUNTER — Telehealth: Payer: Self-pay | Admitting: Family Medicine

## 2020-08-18 LAB — HEMOGLOBIN AND HEMATOCRIT, BLOOD
Hematocrit: 52.8 % — ABNORMAL HIGH (ref 37.5–51.0)
Hemoglobin: 18.3 g/dL — ABNORMAL HIGH (ref 13.0–17.7)

## 2020-08-18 LAB — TESTOSTERONE: Testosterone: 782 ng/dL (ref 264–916)

## 2020-08-18 LAB — PSA: Prostate Specific Ag, Serum: 1.1 ng/mL (ref 0.0–4.0)

## 2020-08-18 NOTE — Telephone Encounter (Signed)
-----   Message from Nori Riis, PA-C sent at 08/18/2020  9:34 AM EDT ----- Please let Mr. Cuevas know that his hemoglobin is creeping up again.  I suggest he reduce his testosterone dose to 0.5 cc every 14 days and we need to recheck his hemoglobin, hematocrit and testosterone level in one month.  I would also make sure to sleep with CPAP nightly and speak with his nephrologist about whether or not it is safe for him to donate blood after having a kidney transplant.

## 2020-08-18 NOTE — Telephone Encounter (Signed)
Patient notified and voiced understanding.

## 2020-09-12 DIAGNOSIS — Z20822 Contact with and (suspected) exposure to covid-19: Secondary | ICD-10-CM | POA: Diagnosis not present

## 2020-09-18 ENCOUNTER — Other Ambulatory Visit: Payer: Self-pay

## 2020-09-18 DIAGNOSIS — E291 Testicular hypofunction: Secondary | ICD-10-CM

## 2020-09-20 ENCOUNTER — Other Ambulatory Visit: Payer: Self-pay

## 2020-09-21 ENCOUNTER — Encounter: Payer: Self-pay | Admitting: Urology

## 2020-09-29 ENCOUNTER — Encounter: Payer: Self-pay | Admitting: Urology

## 2020-09-29 ENCOUNTER — Other Ambulatory Visit: Payer: BC Managed Care – PPO

## 2020-10-09 ENCOUNTER — Telehealth: Payer: Self-pay

## 2020-10-09 DIAGNOSIS — E291 Testicular hypofunction: Secondary | ICD-10-CM

## 2020-10-09 NOTE — Telephone Encounter (Signed)
Labs ordered.

## 2020-10-09 NOTE — Telephone Encounter (Signed)
-----   Message from Nori Riis, PA-C sent at 10/09/2020  8:11 AM EDT ----- Please add a testosterone, HCT and hgb for his lab appointment when he schedules.

## 2020-10-18 ENCOUNTER — Other Ambulatory Visit: Payer: Self-pay

## 2020-10-18 ENCOUNTER — Other Ambulatory Visit: Payer: BC Managed Care – PPO

## 2020-10-18 DIAGNOSIS — E291 Testicular hypofunction: Secondary | ICD-10-CM | POA: Diagnosis not present

## 2020-10-19 ENCOUNTER — Telehealth: Payer: Self-pay | Admitting: Family Medicine

## 2020-10-19 ENCOUNTER — Other Ambulatory Visit: Payer: Self-pay | Admitting: Family Medicine

## 2020-10-19 DIAGNOSIS — E291 Testicular hypofunction: Secondary | ICD-10-CM

## 2020-10-19 LAB — TESTOSTERONE: Testosterone: 444 ng/dL (ref 264–916)

## 2020-10-19 LAB — HEMOGLOBIN AND HEMATOCRIT, BLOOD
Hematocrit: 52.1 % — ABNORMAL HIGH (ref 37.5–51.0)
Hemoglobin: 17.2 g/dL (ref 13.0–17.7)

## 2020-10-19 MED ORDER — TESTOSTERONE CYPIONATE 200 MG/ML IM SOLN: INTRAMUSCULAR | 2 refills | Status: DC

## 2020-10-19 NOTE — Telephone Encounter (Signed)
-----   Message from Nori Riis, PA-C sent at 10/19/2020  8:04 AM EDT ----- Please let Mr. Chad Mccann know that his blood work is stable.  He needs to continue testosterone dose  0.5 cc every 14 days.  I will need to see him next month for PSA, testosterone, HCT, Hemoglobin, I PSS, SHIM and exam

## 2020-10-19 NOTE — Telephone Encounter (Signed)
-----   Message from Nori Riis, PA-C sent at 10/19/2020  8:04 AM EDT ----- Please let Chad Mccann know that his blood work is stable.  He needs to continue testosterone dose  0.5 cc every 14 days.  I will need to see him next month for PSA, testosterone, HCT, Hemoglobin, I PSS, SHIM and exam

## 2020-10-19 NOTE — Telephone Encounter (Signed)
Patient notified and voiced understanding. Appointments have been scheduled. He is requesting a refill on the testosterone.

## 2020-10-19 NOTE — Telephone Encounter (Signed)
Patient notified and voiced understanding. Appointments have been scheduled.

## 2020-11-10 ENCOUNTER — Telehealth: Payer: Self-pay | Admitting: Urology

## 2020-11-10 ENCOUNTER — Other Ambulatory Visit: Payer: Self-pay | Admitting: Family Medicine

## 2020-11-10 DIAGNOSIS — N138 Other obstructive and reflux uropathy: Secondary | ICD-10-CM

## 2020-11-10 DIAGNOSIS — E291 Testicular hypofunction: Secondary | ICD-10-CM

## 2020-11-10 NOTE — Telephone Encounter (Signed)
Would you please also add hemoglobin, hematocrit and testosterone to Mr. Pocock's blood work?

## 2020-11-15 ENCOUNTER — Other Ambulatory Visit: Payer: Self-pay

## 2020-11-15 ENCOUNTER — Other Ambulatory Visit: Payer: BC Managed Care – PPO

## 2020-11-15 DIAGNOSIS — N138 Other obstructive and reflux uropathy: Secondary | ICD-10-CM

## 2020-11-15 DIAGNOSIS — E291 Testicular hypofunction: Secondary | ICD-10-CM | POA: Diagnosis not present

## 2020-11-15 DIAGNOSIS — N401 Enlarged prostate with lower urinary tract symptoms: Secondary | ICD-10-CM | POA: Diagnosis not present

## 2020-11-16 LAB — HEMATOCRIT: Hematocrit: 51.9 % — ABNORMAL HIGH (ref 37.5–51.0)

## 2020-11-16 LAB — HEMOGLOBIN: Hemoglobin: 17.7 g/dL (ref 13.0–17.7)

## 2020-11-16 LAB — TESTOSTERONE: Testosterone: 206 ng/dL — ABNORMAL LOW (ref 264–916)

## 2020-11-16 LAB — PSA: Prostate Specific Ag, Serum: 0.9 ng/mL (ref 0.0–4.0)

## 2020-11-21 NOTE — Progress Notes (Deleted)
12:57 PM   KLAY SOBOTKA Jul 19, 1966 [id number]  Referring provider: [name], MD [address] [place],   [address]  CC:  6 months follow up   HPI: Patient is a 54 year old male with a history of renal transplant, erectile dysfunction and testosterone deficiency who presents today for follow-up.    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  RUS 02/2016 Stable RI's in the renal transplant arteries. No significant change compared with prior study.  Unchanged echogenic kidneys consistent with medical renal disease.  Followed at [facility] and was last seen in 05/2020.   Specimen:  Blood  Ref Range & Units 5 mo ago  Sodium 135 - 145 mmol/L 139      Potassium 3.5 - 5.0 mmol/L 5.1High      Chloride 98 - 107 mmol/L 105      CO2 22.0 - 30.0 mmol/L 29.0      Anion Gap 7 - 15 mmol/L 5Low      BUN 7 - 21 mg/dL 17      Creatinine 0.70 - 1.30 mg/dL 1.00      BUN/Creatinine Ratio  17      EGFR CKD-EPI Non-African American, Male >=60 mL/min/1.34m 85      EGFR CKD-EPI African American, Male >=60 mL/min/1.734m>90      Glucose 70 - 179 mg/dL 71      Calcium 8.5 - 10.2 mg/dL 9.5      Phosphorus 2.9 - 4.7 mg/dL 2.4Low      Albumin 3.5 - 5.0 g/dL 4.3      Resulting Agency  [facility]  Specimen Collected: 06/15/20 8:21 AM Last Resulted: 06/15/20 4:19 PM  [place] From: [facility] Received: 11/10/20 10:13 AM     Erectile dysfunction His SHIM score is ***, which is *** erectile dysfunction.   His previous SHIM score was 17.  He has been having difficulty with erections for the last several years.   His major complaint is maintaining an erections.  His libido is preserved.   His risk factors for ED are hyperlipidemia, HTN, testosterone deficiency and age.  He denies any painful erections or curvatures with  his erections.  He finds the Cialis 20 mg daily.      Score: 1-7 Severe ED 8-11 Moderate ED 12-16 Mild-Moderate ED 17-21 Mild ED 22-25 No ED  Testosterone deficiency His pretreatment testosterone level was 116 ng/dL on 05/14/2012.  He is currently managing his testosterone deficiency with testosterone cypionate 200 mg/cc,  0.5 cc IM q 2 weeks.  He is not having spontaneous erections.  He does sleep with a CPAP machine.      Component     Latest Ref Rng & Units 11/15/2020  Testosterone     264 - 916 ng/dL 206 (L)   Component     Latest Ref Rng & Units 11/15/2020  HCT     37.5 - 51.0 % 51.9 (H)   Component     Latest Ref Rng & Units 11/15/2020  Hemoglobin     13.0 - 17.7 g/dL 17.7     BPH WITH LUTS  (prostate and/or bladder) IPSS score: ***   Previous score: 8/2 Previous PVR: 78 mL  Major complaint(s):  Nocturia x 2.  Denies any dysuria, hematuria or suprapubic pain.   Currently taking: Nothing.  Flomax made him dizzy.  Denies any recent fevers, chills, nausea or vomiting.      Score:  1-7 Mild 8-19 Moderate 20-35 Severe   PMH: Past Medical History:  Diagnosis Date  . Benign prostatic hypertrophy   . Bladder spasm   . Erectile dysfunction   . Frequency   . GERD (gastroesophageal reflux disease)   . Hypertension   . Hypogonadism in male   . Kidney 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   . Kidney, malrotation   . MGUS (monoclonal gammopathy of unknown significance)   . Monoclonal paraproteinemia   . Obesity, unspecified   . Other specified congenital anomaly of kidney   . Other specified disorders of bladder   . Other testicular hypofunction   . Premature ejaculation   . Sleep apnea   . Ventral hernia     Surgical History: Past Surgical History:  Procedure Laterality Date  . BACK SURGERY     c5/6 fusion after discectomy 3 weeks prior  . COLONOSCOPY     polyp removal  . dialysis port placement     and removal  . HERNIA REPAIR  12/29/13   ventral  .  KIDNEY TRANSPLANT  01/2012  . SPINE SURGERY     cervical fusion  . TOTAL HIP ARTHROPLASTY Left 03/02/2019   Procedure: TOTAL HIP ARTHROPLASTY ANTERIOR APPROACH-LEFT;  Surgeon: Hessie Knows, MD;  Location: [facility] ORS;  Service: Orthopedics;  Laterality: Left;    Home Medications:  Allergies as of 11/22/2020      Reactions   Hydralazine Other (See Comments)   Nose bleeds/light headed   Morphine And Related Other (See Comments)   "difficulty waking patient"   Prednisone    hallucinations   Shellfish Allergy Nausea And Vomiting      Medication List       Accurate as of November 21, 2020 12:57 PM. If you have any questions, ask your nurse or doctor.        acetaminophen 325 MG tablet Commonly known as: TYLENOL Take 1-2 tablets (325-650 mg total) by mouth every 6 (six) hours as needed for mild pain (pain score 1-3 or temp > 100.5).   atorvastatin 10 MG tablet Commonly known as: LIPITOR Take 10 mg by mouth every evening.   clonazePAM 0.5 MG tablet Commonly known as: KLONOPIN Take 0.5 mg by mouth at bedtime.   metoprolol tartrate 100 MG tablet Commonly known as: LOPRESSOR Take 100 mg by mouth 2 (two) times daily.   mycophenolate 250 MG capsule Commonly known as: CELLCEPT Take 750 mg by mouth.   sildenafil 100 MG tablet Commonly known as: VIAGRA Take 1 tablet (100 mg total) by mouth daily as needed for erectile dysfunction.   tacrolimus 1 MG capsule Commonly known as: PROGRAF Take 2-3 mg by mouth See admin instructions. Take 3 capsules (3 mg) by mouth in the morning & take 2 capsules (2 mg) by mouth at night   tadalafil 20 MG tablet Commonly known as: CIALIS Take 1 tablet (20 mg total) by mouth daily as needed for erectile dysfunction.   tamsulosin 0.4 MG Caps capsule Commonly known as: FLOMAX   telmisartan 20 MG tablet Commonly known as: MICARDIS   testosterone cypionate 200 MG/ML injection Commonly known as: DEPOTESTOSTERONE CYPIONATE Inject 0.5  mL into the  muscle every 14 days       Allergies:  Allergies  Allergen Reactions  . Hydralazine Other (See Comments)    Nose bleeds/light headed  . Morphine And Related Other (See Comments)    "difficulty waking patient"  .  Prednisone     hallucinations  . Shellfish Allergy Nausea And Vomiting    Family History: Family History  Problem Relation Age of Onset  . Colon cancer Father 8  . Heart attack Father   . Stroke Brother   . Colon cancer Maternal Grandfather   . Kidney disease Neg Hx   . Prostate cancer Neg Hx   . Kidney cancer Neg Hx   . Bladder Cancer Neg Hx     Social History:  reports that he has never smoked. He has never used smokeless tobacco. He reports current alcohol use. He reports that he does not use drugs.  ROS: For pertinent review of systems please refer to history of present illness  Physical Exam: There were no vitals taken for this visit.  Constitutional:  Well nourished. Alert and oriented, No acute distress. HEENT: [place] AT, moist mucus membranes.  Trachea midline Cardiovascular: No clubbing, cyanosis, or edema. Respiratory: Normal respiratory effort, no increased work of breathing. GI: Abdomen is soft, non tender, non distended, no abdominal masses. Liver and spleen not palpable.  No hernias appreciated.  Stool sample for occult testing is not indicated.   GU: No CVA tenderness.  No bladder fullness or masses.  Patient with circumcised/uncircumcised phallus. ***Foreskin easily retracted***  Urethral meatus is patent.  No penile discharge. No penile lesions or rashes. Scrotum without lesions, cysts, rashes and/or edema.  Testicles are located scrotally bilaterally. No masses are appreciated in the testicles. Left and right epididymis are normal. Rectal: Patient with  normal sphincter tone. Anus and perineum without scarring or rashes. No rectal masses are appreciated. Prostate is approximately *** grams, *** nodules are appreciated. Seminal vesicles are  normal. Skin: No rashes, bruises or suspicious lesions. Lymph: No inguinal adenopathy. Neurologic: Grossly intact, no focal deficits, moving all 4 extremities. Psychiatric: Normal mood and affect.   Laboratory Data: Component     Latest Ref Rng & Units 11/28/2015 05/23/2016 04/09/2017 10/06/2017  Prostate Specific Ag, Serum     0.0 - 4.0 ng/mL 0.9 0.7 0.6 0.8   Component     Latest Ref Rng & Units 11/26/2017 05/26/2018 11/17/2018 05/18/2019  Prostate Specific Ag, Serum     0.0 - 4.0 ng/mL 1.0 1.1 0.7 1.2   Component     Latest Ref Rng & Units 11/15/2019 08/17/2020 11/15/2020  Prostate Specific Ag, Serum     0.0 - 4.0 ng/mL 1.1 1.1 0.9    Lab Results  Component Value Date   TESTOSTERONE 206 (L) 11/15/2020   I have reviewed the labs.  Assessment & Plan:    1. History of renal transplant Followed by nephrology - last seen in 05/2020  2. Testosterone deficiency   Recent testosterone level is not therapeutic, but we cannot risk a higher dose due to history erthrocytosis HCT is increasing RTC in three months HCT, hemoglobin and testosterone (one week after an injection)  3. BPH with LU TS I PSS score: 8/2, it is improved  Continue conservative management, avoiding bladder irritants and timed voidings  Return to clinic in 6 months for IPSS score, PSA and exam  RTC in 6 months for PSA and exam, as testosterone therapy can cause prostate enlargement and worsen LUTS  4. Erectile dysfunction:    SHIM score is 17, it is improving  Script given for Cialis 20 mg daily - refills sent to [place]  RTC in 6 months for SHIM score and exam, as testosterone therapy can affect erections   No  follow-ups on file.  These notes generated with voice recognition software. I apologize for typographical errors.  Zara Council, PA-C  [facility] [address] [place], [place] [address] [phone]

## 2020-11-22 ENCOUNTER — Ambulatory Visit: Payer: Self-pay | Admitting: Urology

## 2020-11-22 DIAGNOSIS — E349 Endocrine disorder, unspecified: Secondary | ICD-10-CM

## 2020-11-22 DIAGNOSIS — N529 Male erectile dysfunction, unspecified: Secondary | ICD-10-CM

## 2020-11-22 DIAGNOSIS — Z940 Kidney transplant status: Secondary | ICD-10-CM

## 2020-11-22 DIAGNOSIS — N138 Other obstructive and reflux uropathy: Secondary | ICD-10-CM

## 2020-11-23 ENCOUNTER — Encounter: Payer: Self-pay | Admitting: Urology

## 2020-11-30 NOTE — Progress Notes (Signed)
11:59 AM   Chad Mccann 1966/10/29 [id number]  Referring provider: [name], MD [address] [place],  [place] [address]  CC:  6 months follow up   HPI: Patient is a 54 year old male with a history of renal transplant, erectile dysfunction and testosterone deficiency who presents today for follow-up.    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  RUS 02/2016 Stable RI's in the renal transplant arteries. No significant change compared with prior study.  Unchanged echogenic kidneys consistent with medical renal disease.  Followed at [facility] and was last seen in 05/2020.   Specimen:  Blood  Ref Range & Units 5 mo ago  Sodium 135 - 145 mmol/L 139      Potassium 3.5 - 5.0 mmol/L 5.1High      Chloride 98 - 107 mmol/L 105      CO2 22.0 - 30.0 mmol/L 29.0      Anion Gap 7 - 15 mmol/L 5Low      BUN 7 - 21 mg/dL 17      Creatinine 0.70 - 1.30 mg/dL 1.00      BUN/Creatinine Ratio  17      EGFR CKD-EPI Non-African American, Male >=60 mL/min/1.7m 85      EGFR CKD-EPI African American, Male >=60 mL/min/1.790m>90      Glucose 70 - 179 mg/dL 71      Calcium 8.5 - 10.2 mg/dL 9.5      Phosphorus 2.9 - 4.7 mg/dL 2.4Low      Albumin 3.5 - 5.0 g/dL 4.3      Resulting Agency  [facility]  Specimen Collected: 06/15/20 8:21 AM Last Resulted: 06/15/20 4:19 PM  [facility] From: [facility] Received: 11/10/20 10:13 AM     Erectile dysfunction His SHIM score is 15, which is mild to moderate erectile dysfunction.   His previous SHIM score was 17.  He has been having difficulty with erections for the last several years.   His major complaint is maintaining an erections.  His libido is preserved.   His risk factors for ED are hyperlipidemia, HTN, testosterone deficiency and age.  He denies any painful erections or  curvatures with his erections.  He finds the Cialis 20 mg daily.     SHIM    Row Name 12/01/20 1142         SHIM: Over the last 6 months:   How do you rate your confidence that you could get and keep an erection? Moderate     When you had erections with sexual stimulation, how often were your erections hard enough for penetration (entering your partner)? Sometimes (about half the time)     During sexual intercourse, how often were you able to maintain your erection after you had penetrated (entered) your partner? Sometimes (about half the time)     During sexual intercourse, how difficult was it to maintain your erection to completion of intercourse? Difficult     When you attempted sexual intercourse, how often was it satisfactory for you? Sometimes (about half the time)           SHIM Total Score   SHIM 15            Score: 1-7 Severe ED 8-11 Moderate ED 12-16 Mild-Moderate ED 17-21 Mild ED 22-25 No ED  Testosterone deficiency His pretreatment testosterone level  was 116 ng/dL on 05/14/2012.  He is currently managing his testosterone deficiency with testosterone cypionate 200 mg/cc,  0.5 cc IM q 2 weeks.  He is not having spontaneous erections.  He does sleep with a CPAP machine.      Component     Latest Ref Rng & Units 11/15/2020  Testosterone     264 - 916 ng/dL 206 (L)   Component     Latest Ref Rng & Units 11/15/2020  HCT     37.5 - 51.0 % 51.9 (H)   Component     Latest Ref Rng & Units 11/15/2020  Hemoglobin     13.0 - 17.7 g/dL 17.7     BPH WITH LUTS  (prostate and/or bladder) IPSS score: 9/2  Previous score: 8/2 Previous PVR: 78 mL  Major complaint(s):  None.  Patient denies any modifying or aggravating factors.  Patient denies any gross hematuria, dysuria or suprapubic/flank pain.  Patient denies any fevers, chills, nausea or vomiting.     IPSS    Row Name 12/01/20 1100         International Prostate Symptom Score   How often have you had the  sensation of not emptying your bladder? Less than 1 in 5     How often have you had to urinate less than every two hours? Less than half the time     How often have you found you stopped and started again several times when you urinated? Less than 1 in 5 times     How often have you found it difficult to postpone urination? Less than half the time     How often have you had a weak urinary stream? Less than half the time     How often have you had to strain to start urination? Not at All     How many times did you typically get up at night to urinate? 1 Time     Total IPSS Score 9           Quality of Life due to urinary symptoms   If you were to spend the rest of your life with your urinary condition just the way it is now how would you feel about that? Mostly Satisfied            Score:  1-7 Mild 8-19 Moderate 20-35 Severe   PMH: Past Medical History:  Diagnosis Date  . Benign prostatic hypertrophy   . Bladder spasm   . Erectile dysfunction   . Frequency   . GERD (gastroesophageal reflux disease)   . Hypertension   . Hypogonadism in male   . Kidney 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   . Kidney, malrotation   . MGUS (monoclonal gammopathy of unknown significance)   . Monoclonal paraproteinemia   . Obesity, unspecified   . Other specified congenital anomaly of kidney   . Other specified disorders of bladder   . Other testicular hypofunction   . Premature ejaculation   . Sleep apnea   . Ventral hernia     Surgical History: Past Surgical History:  Procedure Laterality Date  . BACK SURGERY     c5/6 fusion after discectomy 3 weeks prior  . COLONOSCOPY     polyp removal  . dialysis port placement     and removal  . HERNIA REPAIR  12/29/13   ventral  . KIDNEY TRANSPLANT  01/2012  . SPINE SURGERY     cervical fusion  . TOTAL HIP ARTHROPLASTY Left  03/02/2019   Procedure: TOTAL HIP ARTHROPLASTY ANTERIOR APPROACH-LEFT;  Surgeon: Hessie Knows, MD;  Location: [facility] ORS;  Service:  Orthopedics;  Laterality: Left;    Home Medications:  Allergies as of 12/01/2020      Reactions   Hydralazine Other (See Comments)   Nose bleeds/light headed   Morphine And Related Other (See Comments)   "difficulty waking patient"   Prednisone    hallucinations   Shellfish Allergy Nausea And Vomiting      Medication List       Accurate as of December 01, 2020 11:59 AM. If you have any questions, ask your nurse or doctor.        STOP taking these medications   tamsulosin 0.4 MG Caps capsule Commonly known as: FLOMAX Stopped by: Zara Council, PA-C     TAKE these medications   acetaminophen 325 MG tablet Commonly known as: TYLENOL Take 1-2 tablets (325-650 mg total) by mouth every 6 (six) hours as needed for mild pain (pain score 1-3 or temp > 100.5).   atorvastatin 10 MG tablet Commonly known as: LIPITOR Take 10 mg by mouth every evening.   clonazePAM 0.5 MG tablet Commonly known as: KLONOPIN Take 0.5 mg by mouth at bedtime.   metoprolol tartrate 100 MG tablet Commonly known as: LOPRESSOR Take 100 mg by mouth 2 (two) times daily.   mycophenolate 250 MG capsule Commonly known as: CELLCEPT Take 750 mg by mouth.   sildenafil 100 MG tablet Commonly known as: VIAGRA Take 1 tablet (100 mg total) by mouth daily as needed for erectile dysfunction.   tacrolimus 1 MG capsule Commonly known as: PROGRAF Take 2-3 mg by mouth See admin instructions. Take 3 capsules (3 mg) by mouth in the morning & take 2 capsules (2 mg) by mouth at night   tadalafil 20 MG tablet Commonly known as: CIALIS Take 1 tablet (20 mg total) by mouth daily as needed for erectile dysfunction.   telmisartan 20 MG tablet Commonly known as: MICARDIS   testosterone cypionate 200 MG/ML injection Commonly known as: DEPOTESTOSTERONE CYPIONATE Inject 0.5  mL into the muscle every 14 days       Allergies:  Allergies  Allergen Reactions  . Hydralazine Other (See Comments)    Nose  bleeds/light headed  . Morphine And Related Other (See Comments)    "difficulty waking patient"  . Prednisone     hallucinations  . Shellfish Allergy Nausea And Vomiting    Family History: Family History  Problem Relation Age of Onset  . Colon cancer Father 17  . Heart attack Father   . Stroke Brother   . Colon cancer Maternal Grandfather   . Kidney disease Neg Hx   . Prostate cancer Neg Hx   . Kidney cancer Neg Hx   . Bladder Cancer Neg Hx     Social History:  reports that he has never smoked. He has never used smokeless tobacco. He reports current alcohol use. He reports that he does not use drugs.  ROS: For pertinent review of systems please refer to history of present illness  Physical Exam: BP 136/85   Pulse (!) 59   Ht '5\' 10"'  (1.778 m)   Wt 238 lb (108 kg)   BMI 34.15 kg/m   Constitutional:  Well nourished. Alert and oriented, No acute distress. HEENT: [place] AT, mask in place.  Trachea midline Cardiovascular: No clubbing, cyanosis, or edema. Respiratory: Normal respiratory effort, no increased work of breathing. GU: No CVA tenderness.  No  bladder fullness or masses.  Patient with circumcised phallus.  Urethral meatus is patent.  No penile discharge. No penile lesions or rashes. Scrotum without lesions, cysts, rashes and/or edema.  Testicles are located scrotally bilaterally. No masses are appreciated in the testicles. Left and right epididymis are normal. Rectal: Patient with  normal sphincter tone. Anus and perineum without scarring or rashes. No rectal masses are appreciated. Prostate is approximately 40 grams, no nodules are appreciated. Seminal vesicles could not be palpated Neurologic: Grossly intact, no focal deficits, moving all 4 extremities. Psychiatric: Normal mood and affect.   Laboratory Data: Component     Latest Ref Rng & Units 11/28/2015 05/23/2016 04/09/2017 10/06/2017  Prostate Specific Ag, Serum     0.0 - 4.0 ng/mL 0.9 0.7 0.6 0.8   Component      Latest Ref Rng & Units 11/26/2017 05/26/2018 11/17/2018 05/18/2019  Prostate Specific Ag, Serum     0.0 - 4.0 ng/mL 1.0 1.1 0.7 1.2   Component     Latest Ref Rng & Units 11/15/2019 08/17/2020 11/15/2020  Prostate Specific Ag, Serum     0.0 - 4.0 ng/mL 1.1 1.1 0.9    Lab Results  Component Value Date   TESTOSTERONE 206 (L) 11/15/2020   I have reviewed the labs.  Assessment & Plan:    1. History of renal transplant Followed by nephrology - last seen in 05/2020  2. Testosterone deficiency   Recent testosterone level is not therapeutic, but we cannot risk a higher dose due to history erthrocytosis HCT is increasing RTC in three months HCT, hemoglobin and testosterone (one week after an injection)  3. BPH with LU TS I PSS score: 9/2, it is worse Continue conservative management, avoiding bladder irritants and timed voidings  Return to clinic in 6 months for IPSS score, PSA and exam  RTC in 6 months for PSA and exam, as testosterone therapy can cause prostate enlargement and worsen LUTS  4. Erectile dysfunction:    SHIM score is 15, it is worse Script given for Cialis 20 mg daily - refills sent to [place]  RTC in 6 months for SHIM score and exam, as testosterone therapy can affect erections  5. Premature ejaculation I discussed the the area using a topical LidoCream or prescribing an SSRI, but patient would like to consider this further and will contact me if he is interested in either   Return in about 3 months (around 03/01/2021) for testosterone(one week after injection) HCT and hemoglobin only .  These notes generated with voice recognition software. I apologize for typographical errors.  Zara Council, PA-C  [facility] [address] [place], [place] [address] [phone]

## 2020-12-01 ENCOUNTER — Ambulatory Visit (INDEPENDENT_AMBULATORY_CARE_PROVIDER_SITE_OTHER): Payer: BC Managed Care – PPO | Admitting: Urology

## 2020-12-01 ENCOUNTER — Other Ambulatory Visit: Payer: Self-pay

## 2020-12-01 ENCOUNTER — Encounter: Payer: Self-pay | Admitting: Urology

## 2020-12-01 VITALS — BP 136/85 | HR 59 | Ht 70.0 in | Wt 238.0 lb

## 2020-12-01 DIAGNOSIS — N529 Male erectile dysfunction, unspecified: Secondary | ICD-10-CM | POA: Diagnosis not present

## 2020-12-01 DIAGNOSIS — E349 Endocrine disorder, unspecified: Secondary | ICD-10-CM

## 2020-12-01 DIAGNOSIS — N401 Enlarged prostate with lower urinary tract symptoms: Secondary | ICD-10-CM

## 2020-12-01 DIAGNOSIS — N138 Other obstructive and reflux uropathy: Secondary | ICD-10-CM | POA: Diagnosis not present

## 2020-12-01 DIAGNOSIS — E291 Testicular hypofunction: Secondary | ICD-10-CM

## 2020-12-01 DIAGNOSIS — F524 Premature ejaculation: Secondary | ICD-10-CM

## 2020-12-01 MED ORDER — TADALAFIL 20 MG PO TABS: 20.0000 mg | ORAL_TABLET | Freq: Every day | ORAL | 11 refills | Status: DC | PRN

## 2020-12-01 MED ORDER — TESTOSTERONE CYPIONATE 200 MG/ML IM SOLN: INTRAMUSCULAR | 2 refills | Status: DC

## 2020-12-02 LAB — COMPREHENSIVE METABOLIC PANEL
ALT: 16 IU/L (ref 0–44)
AST: 11 IU/L (ref 0–40)
Albumin/Globulin Ratio: 1.7 (ref 1.2–2.2)
Albumin: 4.6 g/dL (ref 3.8–4.9)
Alkaline Phosphatase: 80 IU/L (ref 44–121)
BUN/Creatinine Ratio: 15 (ref 9–20)
BUN: 17 mg/dL (ref 6–24)
Bilirubin Total: 0.8 mg/dL (ref 0.0–1.2)
CO2: 22 mmol/L (ref 20–29)
Calcium: 9.8 mg/dL (ref 8.7–10.2)
Chloride: 99 mmol/L (ref 96–106)
Creatinine, Ser: 1.15 mg/dL (ref 0.76–1.27)
GFR calc Af Amer: 82 mL/min/{1.73_m2} (ref >59)
GFR calc non Af Amer: 71 mL/min/{1.73_m2} (ref >59)
Globulin, Total: 2.7 g/dL (ref 1.5–4.5)
Glucose: 87 mg/dL (ref 65–99)
Potassium: 5 mmol/L (ref 3.5–5.2)
Sodium: 136 mmol/L (ref 134–144)
Total Protein: 7.3 g/dL (ref 6.0–8.5)

## 2020-12-02 LAB — LIPID PANEL
Chol/HDL Ratio: 5.3 ratio — ABNORMAL HIGH (ref 0.0–5.0)
Cholesterol, Total: 142 mg/dL (ref 100–199)
HDL: 27 mg/dL — ABNORMAL LOW (ref >39)
LDL Chol Calc (NIH): 90 mg/dL (ref 0–99)
Triglycerides: 136 mg/dL (ref 0–149)
VLDL Cholesterol Cal: 25 mg/dL (ref 5–40)

## 2021-02-01 ENCOUNTER — Other Ambulatory Visit: Payer: Self-pay | Admitting: Urology

## 2021-02-01 DIAGNOSIS — Z6834 Body mass index (BMI) 34.0-34.9, adult: Secondary | ICD-10-CM | POA: Diagnosis not present

## 2021-02-01 DIAGNOSIS — Z79899 Other long term (current) drug therapy: Secondary | ICD-10-CM | POA: Diagnosis not present

## 2021-02-01 DIAGNOSIS — Z09 Encounter for follow-up examination after completed treatment for conditions other than malignant neoplasm: Secondary | ICD-10-CM | POA: Diagnosis not present

## 2021-02-01 DIAGNOSIS — I10 Essential (primary) hypertension: Secondary | ICD-10-CM | POA: Diagnosis not present

## 2021-02-01 DIAGNOSIS — E291 Testicular hypofunction: Secondary | ICD-10-CM

## 2021-02-01 DIAGNOSIS — Z940 Kidney transplant status: Secondary | ICD-10-CM | POA: Diagnosis not present

## 2021-02-01 MED ORDER — TESTOSTERONE CYPIONATE 200 MG/ML IM SOLN: INTRAMUSCULAR | 2 refills | Status: DC

## 2021-02-01 NOTE — Progress Notes (Signed)
Testosterone is refilled.

## 2021-02-02 DIAGNOSIS — I10 Essential (primary) hypertension: Secondary | ICD-10-CM | POA: Diagnosis not present

## 2021-02-02 DIAGNOSIS — Z940 Kidney transplant status: Secondary | ICD-10-CM | POA: Diagnosis not present

## 2021-02-02 DIAGNOSIS — Z79899 Other long term (current) drug therapy: Secondary | ICD-10-CM | POA: Diagnosis not present

## 2021-02-06 DIAGNOSIS — Z79899 Other long term (current) drug therapy: Secondary | ICD-10-CM | POA: Diagnosis not present

## 2021-02-06 DIAGNOSIS — Z940 Kidney transplant status: Secondary | ICD-10-CM | POA: Diagnosis not present

## 2021-03-01 ENCOUNTER — Other Ambulatory Visit: Payer: BC Managed Care – PPO

## 2021-03-01 ENCOUNTER — Other Ambulatory Visit: Payer: Self-pay

## 2021-03-01 DIAGNOSIS — E291 Testicular hypofunction: Secondary | ICD-10-CM

## 2021-03-02 LAB — HEMATOCRIT: Hematocrit: 47.6 % (ref 37.5–51.0)

## 2021-03-02 LAB — HEMOGLOBIN: Hemoglobin: 16.4 g/dL (ref 13.0–17.7)

## 2021-03-02 LAB — TESTOSTERONE: Testosterone: 331 ng/dL (ref 264–916)

## 2021-03-06 ENCOUNTER — Telehealth: Payer: Self-pay | Admitting: Family Medicine

## 2021-03-06 DIAGNOSIS — E291 Testicular hypofunction: Secondary | ICD-10-CM

## 2021-03-06 DIAGNOSIS — N138 Other obstructive and reflux uropathy: Secondary | ICD-10-CM

## 2021-03-06 NOTE — Telephone Encounter (Signed)
Patient notified and wants to call back 1st of June to schedule the appointment. He is not sure when the off week will be. Order for labs are in.

## 2021-03-06 NOTE — Telephone Encounter (Signed)
-----   Message from Nori Riis, PA-C sent at 03/04/2021  7:52 PM EDT ----- Please let Mr. Hogg know that his labs were normal.  I would like to see him in June for blood work and office visit.  Testosterone (one week after injection), HCT, Hgb and PSA.

## 2021-04-17 ENCOUNTER — Telehealth: Payer: Self-pay | Admitting: *Deleted

## 2021-04-17 ENCOUNTER — Other Ambulatory Visit: Payer: Self-pay | Admitting: Urology

## 2021-04-17 ENCOUNTER — Other Ambulatory Visit: Payer: Self-pay | Admitting: *Deleted

## 2021-04-17 DIAGNOSIS — E291 Testicular hypofunction: Secondary | ICD-10-CM

## 2021-04-17 MED ORDER — TESTOSTERONE CYPIONATE 200 MG/ML IM SOLN: INTRAMUSCULAR | 2 refills | Status: DC

## 2021-04-17 NOTE — Telephone Encounter (Signed)
Left message to have pt return my call, pt needs to come in June instead of tomorrow, I have changed his appt and shannon has sent his rx in.

## 2021-04-17 NOTE — Progress Notes (Signed)
Testosterone cypionate prescription sent for new dose of 0.75 cc every 14 days.

## 2021-04-18 ENCOUNTER — Other Ambulatory Visit: Payer: Self-pay | Admitting: *Deleted

## 2021-04-18 ENCOUNTER — Other Ambulatory Visit: Payer: Self-pay

## 2021-04-18 DIAGNOSIS — E291 Testicular hypofunction: Secondary | ICD-10-CM

## 2021-04-18 NOTE — Telephone Encounter (Signed)
Testosterone rx needs to be changed to 31ml per [place]

## 2021-04-19 ENCOUNTER — Ambulatory Visit: Payer: Self-pay | Admitting: Urology

## 2021-04-20 DIAGNOSIS — Z940 Kidney transplant status: Secondary | ICD-10-CM | POA: Diagnosis not present

## 2021-05-04 ENCOUNTER — Other Ambulatory Visit: Payer: Self-pay | Admitting: Urology

## 2021-05-04 DIAGNOSIS — E291 Testicular hypofunction: Secondary | ICD-10-CM

## 2021-05-04 MED ORDER — TESTOSTERONE CYPIONATE 200 MG/ML IM SOLN: INTRAMUSCULAR | 0 refills | Status: DC

## 2021-05-28 ENCOUNTER — Other Ambulatory Visit: Payer: Self-pay

## 2021-05-28 ENCOUNTER — Other Ambulatory Visit: Payer: BC Managed Care – PPO

## 2021-05-28 DIAGNOSIS — E291 Testicular hypofunction: Secondary | ICD-10-CM | POA: Diagnosis not present

## 2021-05-28 NOTE — Progress Notes (Signed)
10:37 AM   Chad Mccann [birth year] [id number]  Referring provider: [name], MD [address] [place],  [place] [address]  Urological history: 1. 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 Testosterone deficiency -testosterone 355 in 05/2021 -hemoglobin and HCT normal in 05/2021 -managed with testosterone cypionate 200 cc/mg, 0.75 cc every 14 days   3. ED -contributing factors of hyperlipidemia, HTN, testosterone deficiency and age -SHIM 71 -managed with tadalafil 20 mg, on-demand-dosing  4. BPH with LU TS -PSA 1.4 in 05/2021 -I PSS 9/1  5. Sleep apnea -sleeps with CPAP   6. Premature ejaculation   CC:  6 months follow up   HPI: Chad Mccann is a 55 y.o. male who presents today for 6 months follow up.       Patient still having spontaneous erections.  He denies any pain or curvature with erections.  He continues to experience episodes of premature ejaculation at this time.   SHIM    Row Name 05/29/21 1008         SHIM: Over the last 6 months:   How do you rate your confidence that you could get and keep an erection? Moderate     When you had erections with sexual stimulation, how often were your erections hard enough for penetration (entering your partner)? Most Times (much more than half the time)     During sexual intercourse, how often were you able to maintain your erection after you had penetrated (entered) your partner? Most Times (much more than half the time)     During sexual intercourse, how difficult was it to maintain your erection to completion of intercourse? Slightly Difficult     When you attempted sexual intercourse, how often was it satisfactory for you? Sometimes (about half the time)           SHIM Total Score   SHIM 18            Score: 1-7 Severe ED 8-11 Moderate ED 12-16 Mild-Moderate ED 17-21  Mild ED 22-25 No ED   He has no urinary complaints at this time.  Patient denies any modifying or aggravating factors.  Patient denies any gross hematuria, dysuria or suprapubic/flank pain.  Patient denies any fevers, chills, nausea or vomiting.    IPSS    Row Name 05/29/21 1000         International Prostate Symptom Score   How often have you had the sensation of not emptying your bladder? Less than 1 in 5     How often have you had to urinate less than every two hours? Less than half the time     How often have you found you stopped and started again several times when you urinated? Less than 1 in 5 times     How often have you found it difficult to postpone urination? Less than half the time     How often have you had a weak urinary stream? Less than half the time     How often have you had to strain to start urination? Not at All     How many times did you typically get up at night to urinate? 1 Time     Total IPSS Score 9           Quality of Life due to urinary symptoms   If  you were to spend the rest of your life with your urinary condition just the way it is now how would you feel about that? Pleased            Score:  1-7 Mild 8-19 Moderate 20-35 Severe   PMH: Past Medical History:  Diagnosis Date  . Benign prostatic hypertrophy   . Bladder spasm   . Erectile dysfunction   . Frequency   . GERD (gastroesophageal reflux disease)   . Hypertension   . Hypogonadism in male   . Kidney 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   . Kidney, malrotation   . MGUS (monoclonal gammopathy of unknown significance)   . Monoclonal paraproteinemia   . Obesity, unspecified   . Other specified congenital anomaly of kidney   . Other specified disorders of bladder   . Other testicular hypofunction   . Premature ejaculation   . Sleep apnea   . Ventral hernia     Surgical History: Past Surgical History:  Procedure Laterality Date  . BACK SURGERY     c5/6 fusion after discectomy 3 weeks  prior  . COLONOSCOPY     polyp removal  . dialysis port placement     and removal  . HERNIA REPAIR  12/29/13   ventral  . KIDNEY TRANSPLANT  01/2012  . SPINE SURGERY     cervical fusion  . TOTAL HIP ARTHROPLASTY Left 03/02/2019   Procedure: TOTAL HIP ARTHROPLASTY ANTERIOR APPROACH-LEFT;  Surgeon: Hessie Knows, MD;  Location: [facility] ORS;  Service: Orthopedics;  Laterality: Left;    Home Medications:  Allergies as of 05/29/2021      Reactions   Hydralazine Other (See Comments)   Nose bleeds/light headed   Morphine And Related Other (See Comments)   "difficulty waking patient"   Prednisone    hallucinations   Shellfish Allergy Nausea And Vomiting      Medication List       Accurate as of May 29, 2021 10:37 AM. If you have any questions, ask your nurse or doctor.        acetaminophen 325 MG tablet Commonly known as: TYLENOL Take 1-2 tablets (325-650 mg total) by mouth every 6 (six) hours as needed for mild pain (pain score 1-3 or temp > 100.5).   atorvastatin 10 MG tablet Commonly known as: LIPITOR Take 10 mg by mouth every evening.   chlorthalidone 25 MG tablet Commonly known as: HYGROTON Take 12.5 mg by mouth every morning.   clonazePAM 0.5 MG tablet Commonly known as: KLONOPIN Take 0.5 mg by mouth at bedtime.   lidocaine-prilocaine cream Commonly known as: EMLA Apply 1 application topically as needed. Started by: Zara Council, PA-C   metoprolol tartrate 100 MG tablet Commonly known as: LOPRESSOR Take 100 mg by mouth 2 (two) times daily.   mycophenolate 250 MG capsule Commonly known as: CELLCEPT Take 750 mg by mouth.   tacrolimus 1 MG capsule Commonly known as: PROGRAF Take 2-3 mg by mouth See admin instructions. Take 3 capsules (3 mg) by mouth in the morning & take 2 capsules (2 mg) by mouth at night   tadalafil 20 MG tablet Commonly known as: CIALIS Take 1 tablet (20 mg total) by mouth daily as needed for erectile dysfunction.   telmisartan 20 MG  tablet Commonly known as: MICARDIS   testosterone cypionate 200 MG/ML injection Commonly known as: DEPOTESTOSTERONE CYPIONATE Inject 0.75  mL into the muscle every 14 days       Allergies:  Allergies  Allergen Reactions  .  Hydralazine Other (See Comments)    Nose bleeds/light headed  . Morphine And Related Other (See Comments)    "difficulty waking patient"  . Prednisone     hallucinations  . Shellfish Allergy Nausea And Vomiting    Family History: Family History  Problem Relation Age of Onset  . Colon cancer Father 38  . Heart attack Father   . Stroke Brother   . Colon cancer Maternal Grandfather   . Kidney disease Neg Hx   . Prostate cancer Neg Hx   . Kidney cancer Neg Hx   . Bladder Cancer Neg Hx     Social History:  reports that he has never smoked. He has never used smokeless tobacco. He reports current alcohol use. He reports that he does not use drugs.  ROS: For pertinent review of systems please refer to history of present illness  Physical Exam: BP 124/79   Pulse (!) 59   Ht '5\' 10"'  (1.778 m)   Wt 233 lb (105.7 kg)   BMI 33.43 kg/m   Constitutional:  Well nourished. Alert and oriented, No acute distress. HEENT:  AT, mask in place.  Trachea midline Cardiovascular: No clubbing, cyanosis, or edema. Respiratory: Normal respiratory effort, no increased work of breathing. GU: No CVA tenderness.  No bladder fullness or masses.  Patient with circumcised phallus.  Urethral meatus is patent.  No penile discharge. No penile lesions or rashes. Scrotum without lesions, cysts, rashes and/or edema.  Testicles are located scrotally bilaterally. No masses are appreciated in the testicles. Left and right epididymis are normal. Rectal: Patient with  normal sphincter tone. Anus and perineum without scarring or rashes. No rectal masses are appreciated. Prostate is approximately 45 grams, no nodules are appreciated. Seminal vesicles could not be palpated. Lymph: No inguinal  adenopathy. Neurologic: Grossly intact, no focal deficits, moving all 4 extremities. Psychiatric: Normal mood and affect.   Laboratory Data: WBC 3.6 - 11.2 10*9/L 4.4   RBC 4.26 - 5.60 10*12/L 4.94   HGB 12.9 - 16.5 g/dL 16.4   HCT 39.0 - 48.0 % 45.7   MCV 77.6 - 95.7 fL 92.4   MCH 25.9 - 32.4 pg 33.2High   MCHC 32.0 - 36.0 g/dL 36.0   RDW 12.2 - 15.2 % 13.6   MPV 6.8 - 10.7 fL 8.6   Platelet 150 - 450 10*9/L 198   Neutrophils % % 60.2   Lymphocytes % % 28.9   Monocytes % % 6.8   Eosinophils % % 3.4   Basophils % % 0.7   Absolute Neutrophils 1.8 - 7.8 10*9/L 2.6   Absolute Lymphocytes 1.1 - 3.6 10*9/L 1.3   Absolute Monocytes 0.3 - 0.8 10*9/L 0.3   Absolute Eosinophils 0.0 - 0.5 10*9/L 0.2   Absolute Basophils 0.0 - 0.1 10*9/L 0.0   Resulting Agency  [facility] CLINICAL LABORATORIES  Specimen Collected: 04/20/21 09:32 Last Resulted: 04/20/21 16:33  Received From: [place]  Result Received: 04/23/21 09:26   Sodium 135 - 145 mmol/L 138   Potassium 3.4 - 4.8 mmol/L 4.5   Chloride 98 - 107 mmol/L 105   Anion Gap 5 - 14 mmol/L 4Low   CO2 20.0 - 31.0 mmol/L 29.0   BUN 9 - 23 mg/dL 19   Creatinine 0.60 - 1.10 mg/dL 1.25High   BUN/Creatinine Ratio  15   EGFR CKD-EPI Non-African American, Male >=60 mL/min/1.42m 64   EGFR CKD-EPI African American, Male >=60 mL/min/1.77m74   Glucose 70 - 179 mg/dL 79  Calcium 8.7 - 10.4 mg/dL 9.9   Albumin 3.4 - 5.0 g/dL 4.1   Total Protein 5.7 - 8.2 g/dL 7.3   Total Bilirubin 0.3 - 1.2 mg/dL 0.7   AST <=34 U/L 11   ALT 10 - 49 U/L 12   Alkaline Phosphatase 46 - 116 U/L 73   Resulting Agency  [facility] CLINICAL LABORATORIES  Specimen Collected: 04/20/21 09:32 Last Resulted: 04/20/21 17:32  Received From: [place]  Result Received: 04/23/21 09:26   Color, UA  Yellow   Clarity, UA  Cloudy   Specific Gravity, UA 1.003 - 1.030 1.020   pH, UA 5.0 - 9.0 5.0   Leukocyte Esterase, UA Negative Negative   Nitrite, UA  Negative Negative   Protein, UA Negative Negative   Glucose, UA Negative Negative   Ketones, UA Negative Negative   Urobilinogen, UA  0.2 mg/dL   Bilirubin, UA Negative Negative   Blood, UA Negative Negative   RBC, UA <=3 /HPF <1   WBC, UA <=2 /HPF <1   Squam Epithel, UA 0 - 5 /HPF <1   Bacteria, UA None Seen /HPF None Seen   Resulting Agency  [facility] CLINICAL LABORATORIES  Specimen Collected: 04/20/21 09:32 Last Resulted: 04/20/21 17:05  Received From: [place]  Result Received: 04/23/21 09:26   Lab Results  Component Value Date   TESTOSTERONE 355 05/28/2021  I have reviewed the labs.  Assessment & Plan:    1. Renal transplant -Followed by nephrology - last seen in 01/2021  2. Testosterone deficiency   -testosterone level is therapeutic -continue 0.75 cc every 14 days -refill sent to pharmacy  3. BPH with LU TS -continue conservative management  4. Erectile dysfunction:    -continue tadalafil   5. Premature ejaculation -I discussed the the area using a topical LidoCream or prescribing an SSRI -patient would like a trial of LidoCream -prescription sent to pharmacy -instructed to apply to penis topically prior to intercourse and to wash the penis off after application before insertion into partner   Return in about 3 months (around 08/29/2021) for testosterone (one week after injection) H & H.  These notes generated with voice recognition software. I apologize for typographical errors.  Zara Council, PA-C  [facility] [address] [place], [place] [address] [phone]

## 2021-05-29 ENCOUNTER — Encounter: Payer: Self-pay | Admitting: Urology

## 2021-05-29 ENCOUNTER — Ambulatory Visit (INDEPENDENT_AMBULATORY_CARE_PROVIDER_SITE_OTHER): Payer: BC Managed Care – PPO | Admitting: Urology

## 2021-05-29 VITALS — BP 124/79 | HR 59 | Ht 70.0 in | Wt 233.0 lb

## 2021-05-29 DIAGNOSIS — E349 Endocrine disorder, unspecified: Secondary | ICD-10-CM

## 2021-05-29 DIAGNOSIS — N138 Other obstructive and reflux uropathy: Secondary | ICD-10-CM

## 2021-05-29 DIAGNOSIS — F524 Premature ejaculation: Secondary | ICD-10-CM

## 2021-05-29 DIAGNOSIS — N401 Enlarged prostate with lower urinary tract symptoms: Secondary | ICD-10-CM | POA: Diagnosis not present

## 2021-05-29 DIAGNOSIS — E291 Testicular hypofunction: Secondary | ICD-10-CM

## 2021-05-29 DIAGNOSIS — Z940 Kidney transplant status: Secondary | ICD-10-CM

## 2021-05-29 DIAGNOSIS — N529 Male erectile dysfunction, unspecified: Secondary | ICD-10-CM | POA: Diagnosis not present

## 2021-05-29 LAB — HEMOGLOBIN AND HEMATOCRIT, BLOOD
Hematocrit: 49 % (ref 37.5–51.0)
Hemoglobin: 16.4 g/dL (ref 13.0–17.7)

## 2021-05-29 LAB — PSA: Prostate Specific Ag, Serum: 1.4 ng/mL (ref 0.0–4.0)

## 2021-05-29 LAB — TESTOSTERONE: Testosterone: 355 ng/dL (ref 264–916)

## 2021-05-29 MED ORDER — TESTOSTERONE CYPIONATE 200 MG/ML IM SOLN: INTRAMUSCULAR | 0 refills | Status: DC

## 2021-05-29 MED ORDER — LIDOCAINE-PRILOCAINE 2.5-2.5 % EX CREA: 1.0000 "application " | TOPICAL_CREAM | CUTANEOUS | 0 refills | Status: DC | PRN

## 2021-06-18 DIAGNOSIS — Z6835 Body mass index (BMI) 35.0-35.9, adult: Secondary | ICD-10-CM | POA: Diagnosis not present

## 2021-06-18 DIAGNOSIS — Z1211 Encounter for screening for malignant neoplasm of colon: Secondary | ICD-10-CM | POA: Diagnosis not present

## 2021-06-18 DIAGNOSIS — Z940 Kidney transplant status: Secondary | ICD-10-CM | POA: Diagnosis not present

## 2021-06-18 DIAGNOSIS — Z79899 Other long term (current) drug therapy: Secondary | ICD-10-CM | POA: Diagnosis not present

## 2021-07-03 DIAGNOSIS — Z940 Kidney transplant status: Secondary | ICD-10-CM | POA: Diagnosis not present

## 2021-07-06 DIAGNOSIS — N3000 Acute cystitis without hematuria: Secondary | ICD-10-CM | POA: Diagnosis not present

## 2021-07-06 DIAGNOSIS — D849 Immunodeficiency, unspecified: Secondary | ICD-10-CM | POA: Diagnosis not present

## 2021-07-09 DIAGNOSIS — R509 Fever, unspecified: Secondary | ICD-10-CM | POA: Diagnosis not present

## 2021-07-09 DIAGNOSIS — Z940 Kidney transplant status: Secondary | ICD-10-CM | POA: Diagnosis not present

## 2021-08-27 ENCOUNTER — Emergency Department: Payer: BC Managed Care – PPO

## 2021-08-27 ENCOUNTER — Other Ambulatory Visit: Payer: Self-pay

## 2021-08-27 ENCOUNTER — Encounter: Payer: Self-pay | Admitting: Emergency Medicine

## 2021-08-27 ENCOUNTER — Encounter: Payer: Self-pay | Admitting: Anesthesiology

## 2021-08-27 ENCOUNTER — Inpatient Hospital Stay
Admission: EM | Admit: 2021-08-27 | Discharge: 2021-08-29 | DRG: 247 | Disposition: A | Discharge status: Home / Self Care | Payer: BC Managed Care – PPO | Attending: Internal Medicine | Admitting: Internal Medicine

## 2021-08-27 ENCOUNTER — Encounter
Admission: EM | Disposition: A | Discharge status: Home / Self Care | Payer: Self-pay | Source: Home / Self Care | Attending: Internal Medicine

## 2021-08-27 DIAGNOSIS — E785 Hyperlipidemia, unspecified: Secondary | ICD-10-CM

## 2021-08-27 DIAGNOSIS — N400 Enlarged prostate without lower urinary tract symptoms: Secondary | ICD-10-CM | POA: Diagnosis present

## 2021-08-27 DIAGNOSIS — Z20822 Contact with and (suspected) exposure to covid-19: Secondary | ICD-10-CM | POA: Diagnosis present

## 2021-08-27 DIAGNOSIS — R0902 Hypoxemia: Secondary | ICD-10-CM | POA: Diagnosis not present

## 2021-08-27 DIAGNOSIS — Z823 Family history of stroke: Secondary | ICD-10-CM | POA: Diagnosis not present

## 2021-08-27 DIAGNOSIS — R079 Chest pain, unspecified: Secondary | ICD-10-CM | POA: Diagnosis not present

## 2021-08-27 DIAGNOSIS — Z79899 Other long term (current) drug therapy: Secondary | ICD-10-CM | POA: Diagnosis not present

## 2021-08-27 DIAGNOSIS — Z800 Family history of malignant neoplasm of digestive organs: Secondary | ICD-10-CM

## 2021-08-27 DIAGNOSIS — Z8249 Family history of ischemic heart disease and other diseases of the circulatory system: Secondary | ICD-10-CM | POA: Diagnosis not present

## 2021-08-27 DIAGNOSIS — I2510 Atherosclerotic heart disease of native coronary artery without angina pectoris: Secondary | ICD-10-CM | POA: Diagnosis not present

## 2021-08-27 DIAGNOSIS — N179 Acute kidney failure, unspecified: Secondary | ICD-10-CM

## 2021-08-27 DIAGNOSIS — Z91013 Allergy to seafood: Secondary | ICD-10-CM

## 2021-08-27 DIAGNOSIS — I213 ST elevation (STEMI) myocardial infarction of unspecified site: Secondary | ICD-10-CM | POA: Diagnosis not present

## 2021-08-27 DIAGNOSIS — R0789 Other chest pain: Secondary | ICD-10-CM | POA: Diagnosis not present

## 2021-08-27 DIAGNOSIS — I2109 ST elevation (STEMI) myocardial infarction involving other coronary artery of anterior wall: Secondary | ICD-10-CM | POA: Diagnosis not present

## 2021-08-27 DIAGNOSIS — I2102 ST elevation (STEMI) myocardial infarction involving left anterior descending coronary artery: Secondary | ICD-10-CM | POA: Diagnosis present

## 2021-08-27 DIAGNOSIS — Z940 Kidney transplant status: Secondary | ICD-10-CM | POA: Diagnosis not present

## 2021-08-27 DIAGNOSIS — I10 Essential (primary) hypertension: Secondary | ICD-10-CM | POA: Diagnosis present

## 2021-08-27 DIAGNOSIS — K219 Gastro-esophageal reflux disease without esophagitis: Secondary | ICD-10-CM | POA: Diagnosis not present

## 2021-08-27 DIAGNOSIS — N186 End stage renal disease: Secondary | ICD-10-CM | POA: Diagnosis not present

## 2021-08-27 DIAGNOSIS — Z96642 Presence of left artificial hip joint: Secondary | ICD-10-CM | POA: Diagnosis not present

## 2021-08-27 DIAGNOSIS — G4733 Obstructive sleep apnea (adult) (pediatric): Secondary | ICD-10-CM | POA: Diagnosis present

## 2021-08-27 HISTORY — PX: CORONARY/GRAFT ACUTE MI REVASCULARIZATION: CATH118305

## 2021-08-27 HISTORY — PX: LEFT HEART CATH AND CORONARY ANGIOGRAPHY: CATH118249

## 2021-08-27 LAB — RESP PANEL BY RT-PCR (FLU A&B, COVID) ARPGX2
Influenza A by PCR: NEGATIVE
Influenza B by PCR: NEGATIVE
SARS Coronavirus 2 by RT PCR: NEGATIVE

## 2021-08-27 LAB — CBC WITH DIFFERENTIAL/PLATELET
Abs Immature Granulocytes: 0.03 10*3/uL (ref 0.00–0.07)
Basophils Absolute: 0 10*3/uL (ref 0.0–0.1)
Basophils Relative: 0 %
Eosinophils Absolute: 0.1 10*3/uL (ref 0.0–0.5)
Eosinophils Relative: 2 %
HCT: 49 % (ref 39.0–52.0)
Hemoglobin: 17.7 g/dL — ABNORMAL HIGH (ref 13.0–17.0)
Immature Granulocytes: 1 %
Lymphocytes Relative: 20 %
Lymphs Abs: 1.1 10*3/uL (ref 0.7–4.0)
MCH: 32.7 pg (ref 26.0–34.0)
MCHC: 36.1 g/dL — ABNORMAL HIGH (ref 30.0–36.0)
MCV: 90.4 fL (ref 80.0–100.0)
Monocytes Absolute: 0.3 10*3/uL (ref 0.1–1.0)
Monocytes Relative: 6 %
Neutro Abs: 3.9 10*3/uL (ref 1.7–7.7)
Neutrophils Relative %: 71 %
Platelets: 199 10*3/uL (ref 150–400)
RBC: 5.42 MIL/uL (ref 4.22–5.81)
RDW: 13.2 % (ref 11.5–15.5)
WBC: 5.5 10*3/uL (ref 4.0–10.5)
nRBC: 0 % (ref 0.0–0.2)

## 2021-08-27 LAB — COMPREHENSIVE METABOLIC PANEL
ALT: 13 U/L (ref 0–44)
AST: 14 U/L — ABNORMAL LOW (ref 15–41)
Albumin: 4.5 g/dL (ref 3.5–5.0)
Alkaline Phosphatase: 57 U/L (ref 38–126)
Anion gap: 6 (ref 5–15)
BUN: 22 mg/dL — ABNORMAL HIGH (ref 6–20)
CO2: 27 mmol/L (ref 22–32)
Calcium: 9.5 mg/dL (ref 8.9–10.3)
Chloride: 104 mmol/L (ref 98–111)
Creatinine, Ser: 1.61 mg/dL — ABNORMAL HIGH (ref 0.61–1.24)
GFR, Estimated: 50 mL/min — ABNORMAL LOW (ref >60)
Glucose, Bld: 118 mg/dL — ABNORMAL HIGH (ref 70–99)
Potassium: 4.5 mmol/L (ref 3.5–5.1)
Sodium: 137 mmol/L (ref 135–145)
Total Bilirubin: 1.1 mg/dL (ref 0.3–1.2)
Total Protein: 7.8 g/dL (ref 6.5–8.1)

## 2021-08-27 LAB — LIPID PANEL
Cholesterol: 143 mg/dL (ref 0–200)
HDL: 28 mg/dL — ABNORMAL LOW (ref >40)
LDL Cholesterol: 82 mg/dL (ref 0–99)
Total CHOL/HDL Ratio: 5.1 RATIO
Triglycerides: 166 mg/dL — ABNORMAL HIGH (ref <150)
VLDL: 33 mg/dL (ref 0–40)

## 2021-08-27 LAB — APTT: aPTT: 25 seconds (ref 24–36)

## 2021-08-27 LAB — PROTIME-INR
INR: 1.1 (ref 0.8–1.2)
Prothrombin Time: 13.7 seconds (ref 11.4–15.2)

## 2021-08-27 LAB — TROPONIN I (HIGH SENSITIVITY)
Troponin I (High Sensitivity): 54 ng/L — ABNORMAL HIGH (ref <18)
Troponin I (High Sensitivity): 102 ng/L (ref <18)
Troponin I (High Sensitivity): 157 ng/L (ref <18)
Troponin I (High Sensitivity): 395 ng/L (ref <18)

## 2021-08-27 LAB — POCT ACTIVATED CLOTTING TIME: Activated Clotting Time: 352 seconds

## 2021-08-27 SURGERY — CORONARY/GRAFT ACUTE MI REVASCULARIZATION
Anesthesia: Moderate Sedation

## 2021-08-27 MED ORDER — VERAPAMIL HCL 2.5 MG/ML IV SOLN
INTRAVENOUS | Status: AC
  Filled 2021-08-27: qty 2

## 2021-08-27 MED ORDER — IOHEXOL 350 MG/ML SOLN
INTRAVENOUS | Status: DC | PRN
  Administered 2021-08-27: 85 mL

## 2021-08-27 MED ORDER — ACETAMINOPHEN 325 MG PO TABS: 650.0000 mg | ORAL_TABLET | ORAL | Status: DC | PRN

## 2021-08-27 MED ORDER — ONDANSETRON HCL 4 MG/2ML IJ SOLN: 4.0000 mg | Freq: Four times a day (QID) | INTRAMUSCULAR | Status: DC | PRN

## 2021-08-27 MED ORDER — FENTANYL CITRATE (PF) 100 MCG/2ML IJ SOLN
INTRAMUSCULAR | Status: DC | PRN
  Administered 2021-08-27 (×2): 25 ug via INTRAVENOUS

## 2021-08-27 MED ORDER — ROSUVASTATIN CALCIUM 10 MG PO TABS
40.0000 mg | ORAL_TABLET | Freq: Every day | ORAL | Status: DC
  Administered 2021-08-27 – 2021-08-29 (×3): 40 mg via ORAL
  Filled 2021-08-27 (×3): qty 4

## 2021-08-27 MED ORDER — HEPARIN (PORCINE) IN NACL 1000-0.9 UT/500ML-% IV SOLN
INTRAVENOUS | Status: AC
  Filled 2021-08-27: qty 1000

## 2021-08-27 MED ORDER — SODIUM CHLORIDE 0.9% FLUSH
3.0000 mL | Freq: Two times a day (BID) | INTRAVENOUS | Status: DC
  Administered 2021-08-27 – 2021-08-29 (×4): 3 mL via INTRAVENOUS

## 2021-08-27 MED ORDER — ENOXAPARIN SODIUM 40 MG/0.4ML IJ SOSY: 40.0000 mg | PREFILLED_SYRINGE | INTRAMUSCULAR | Status: DC

## 2021-08-27 MED ORDER — SODIUM CHLORIDE 0.9% FLUSH: 3.0000 mL | INTRAVENOUS | Status: DC | PRN

## 2021-08-27 MED ORDER — TICAGRELOR 90 MG PO TABS
90.0000 mg | ORAL_TABLET | Freq: Two times a day (BID) | ORAL | Status: DC
  Administered 2021-08-27 – 2021-08-29 (×4): 90 mg via ORAL
  Filled 2021-08-27 (×4): qty 1

## 2021-08-27 MED ORDER — ASPIRIN 81 MG PO CHEW
81.0000 mg | CHEWABLE_TABLET | Freq: Every day | ORAL | Status: DC
  Administered 2021-08-28 – 2021-08-29 (×2): 81 mg via ORAL
  Filled 2021-08-27 (×2): qty 1

## 2021-08-27 MED ORDER — HEPARIN (PORCINE) IN NACL 2000-0.9 UNIT/L-% IV SOLN
INTRAVENOUS | Status: DC | PRN
  Administered 2021-08-27: 1000 mL

## 2021-08-27 MED ORDER — HEPARIN SODIUM (PORCINE) 1000 UNIT/ML IJ SOLN
INTRAMUSCULAR | Status: AC
  Filled 2021-08-27: qty 1

## 2021-08-27 MED ORDER — SODIUM CHLORIDE 0.9 % IV SOLN
INTRAVENOUS | Status: DC
  Administered 2021-08-27: 1000 mL via INTRAVENOUS

## 2021-08-27 MED ORDER — FENTANYL CITRATE PF 50 MCG/ML IJ SOSY
PREFILLED_SYRINGE | INTRAMUSCULAR | Status: AC
  Filled 2021-08-27: qty 1

## 2021-08-27 MED ORDER — HEPARIN SODIUM (PORCINE) 1000 UNIT/ML IJ SOLN
INTRAMUSCULAR | Status: DC | PRN
  Administered 2021-08-27: 6000 [IU] via INTRAVENOUS

## 2021-08-27 MED ORDER — TACROLIMUS 1 MG PO CAPS
2.0000 mg | ORAL_CAPSULE | Freq: Two times a day (BID) | ORAL | Status: DC
  Administered 2021-08-27 – 2021-08-28 (×3): 2 mg via ORAL
  Filled 2021-08-27 (×4): qty 2

## 2021-08-27 MED ORDER — HEPARIN SODIUM (PORCINE) 5000 UNIT/ML IJ SOLN
4000.0000 [IU] | Freq: Once | INTRAMUSCULAR | Status: AC
  Administered 2021-08-27: 4000 [IU] via INTRAVENOUS

## 2021-08-27 MED ORDER — SODIUM CHLORIDE 0.9 % IV SOLN: 250.0000 mL | INTRAVENOUS | Status: DC | PRN

## 2021-08-27 MED ORDER — MIDAZOLAM HCL 2 MG/2ML IJ SOLN
INTRAMUSCULAR | Status: AC
  Filled 2021-08-27: qty 2

## 2021-08-27 MED ORDER — LIDOCAINE HCL 1 % IJ SOLN
INTRAMUSCULAR | Status: AC
  Filled 2021-08-27: qty 20

## 2021-08-27 MED ORDER — SODIUM CHLORIDE 0.9 % IV SOLN: INTRAVENOUS | Status: DC

## 2021-08-27 MED ORDER — MORPHINE SULFATE (PF) 4 MG/ML IV SOLN
4.0000 mg | Freq: Once | INTRAVENOUS | Status: DC
  Filled 2021-08-27: qty 1

## 2021-08-27 MED ORDER — FENTANYL CITRATE (PF) 100 MCG/2ML IJ SOLN
12.5000 ug | INTRAMUSCULAR | Status: DC | PRN
  Administered 2021-08-27: 12.5 ug via INTRAVENOUS
  Filled 2021-08-27: qty 2

## 2021-08-27 MED ORDER — TICAGRELOR 90 MG PO TABS
ORAL_TABLET | ORAL | Status: DC | PRN
  Administered 2021-08-27: 180 mg via ORAL

## 2021-08-27 MED ORDER — CLONAZEPAM 0.5 MG PO TABS
0.5000 mg | ORAL_TABLET | Freq: Every day | ORAL | Status: DC
  Administered 2021-08-27 – 2021-08-28 (×2): 0.5 mg via ORAL
  Filled 2021-08-27 (×2): qty 1

## 2021-08-27 MED ORDER — NITROGLYCERIN 1 MG/10 ML FOR IR/CATH LAB
INTRA_ARTERIAL | Status: DC | PRN
  Administered 2021-08-27: 200 ug via INTRACORONARY

## 2021-08-27 MED ORDER — NITROGLYCERIN 0.4 MG SL SUBL
0.4000 mg | SUBLINGUAL_TABLET | SUBLINGUAL | Status: DC | PRN
  Administered 2021-08-27: 0.4 mg via SUBLINGUAL

## 2021-08-27 MED ORDER — MYCOPHENOLATE MOFETIL 250 MG PO CAPS
750.0000 mg | ORAL_CAPSULE | Freq: Two times a day (BID) | ORAL | Status: DC
  Administered 2021-08-27 – 2021-08-29 (×5): 750 mg via ORAL
  Filled 2021-08-27 (×6): qty 3

## 2021-08-27 MED ORDER — MIDAZOLAM HCL 2 MG/2ML IJ SOLN
INTRAMUSCULAR | Status: DC | PRN
  Administered 2021-08-27: 1 mg via INTRAVENOUS

## 2021-08-27 MED ORDER — VERAPAMIL HCL 2.5 MG/ML IV SOLN
INTRAVENOUS | Status: DC | PRN
  Administered 2021-08-27: 2.5 mg via INTRAVENOUS

## 2021-08-27 MED ORDER — CARVEDILOL 6.25 MG PO TABS
6.2500 mg | ORAL_TABLET | Freq: Two times a day (BID) | ORAL | Status: DC
  Administered 2021-08-27 – 2021-08-29 (×4): 6.25 mg via ORAL
  Filled 2021-08-27 (×4): qty 1

## 2021-08-27 MED ORDER — ENOXAPARIN SODIUM 60 MG/0.6ML IJ SOSY
54.0000 mg | PREFILLED_SYRINGE | INTRAMUSCULAR | Status: DC
  Administered 2021-08-28 – 2021-08-29 (×2): 55 mg via SUBCUTANEOUS
  Filled 2021-08-27 (×2): qty 0.55

## 2021-08-27 MED ORDER — NITROGLYCERIN 1 MG/10 ML FOR IR/CATH LAB
INTRA_ARTERIAL | Status: AC
  Filled 2021-08-27: qty 10

## 2021-08-27 MED ORDER — TICAGRELOR 90 MG PO TABS
ORAL_TABLET | ORAL | Status: AC
  Filled 2021-08-27: qty 2

## 2021-08-27 SURGICAL SUPPLY — 19 items
BALLN TREK RX 2.5X12 (BALLOONS) ×2
BALLN ~~LOC~~ EUPHORA RX 3.25X15 (BALLOONS) ×2
BALLOON TREK RX 2.5X12 (BALLOONS) ×1 IMPLANT
BALLOON ~~LOC~~ EUPHORA RX 3.25X15 (BALLOONS) ×1 IMPLANT
CATH INFINITI JR4 5F (CATHETERS) ×2 IMPLANT
CATH LAUNCHER 6FR EBU 3.75 (CATHETERS) ×2 IMPLANT
DEVICE RAD TR BAND REGULAR (VASCULAR PRODUCTS) ×2 IMPLANT
DRAPE BRACHIAL (DRAPES) ×2 IMPLANT
GLIDESHEATH SLEND SS 6F .021 (SHEATH) ×2 IMPLANT
GUIDEWIRE INQWIRE 1.5J.035X260 (WIRE) ×1 IMPLANT
INQWIRE 1.5J .035X260CM (WIRE) ×2
KIT ENCORE 26 ADVANTAGE (KITS) ×2 IMPLANT
PACK CARDIAC CATH (CUSTOM PROCEDURE TRAY) ×2 IMPLANT
PAD ONESTEP ZOLL R SERIES ADT (MISCELLANEOUS) ×2 IMPLANT
PROTECTION STATION PRESSURIZED (MISCELLANEOUS) ×2
SET ATX SIMPLICITY (MISCELLANEOUS) ×2 IMPLANT
STATION PROTECTION PRESSURIZED (MISCELLANEOUS) ×1 IMPLANT
STENT ONYX FRONTIER 3.0X22 (Permanent Stent) ×2 IMPLANT
WIRE RUNTHROUGH .014X180CM (WIRE) ×2 IMPLANT

## 2021-08-27 NOTE — Plan of Care (Signed)
Care plan reviewed with patient and stated understanding.

## 2021-08-27 NOTE — ED Provider Notes (Signed)
[facility]  ____________________________________________   Event Date/Time   First MD Initiated Contact with Patient 08/27/21 1243     (approximate)  I have reviewed the triage vital signs and the nursing notes.   HISTORY  Chief Complaint Chest Pain    HPI Chad Mccann is a 55 y.o. male past medical history of FSGS status post renal transplant 2013, GERD, obesity, hypertension who presents with chest pain.  Symptoms started around 11 AM.  He endorses a pressure-like sensation in the center of his chest with some tingling in the left arm.  Denies any associated dyspnea, nausea, vomiting or diaphoresis.  Pain has been constant since onset.  Not clearly exertional.  Patient denies any history of similar pain or any cardiac history.      Past Medical History:  Diagnosis Date   Benign prostatic hypertrophy    Bladder spasm    Erectile dysfunction    Frequency    GERD (gastroesophageal reflux disease)    Hypertension    Hypogonadism in male    Kidney 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    Kidney, malrotation    MGUS (monoclonal gammopathy of unknown significance)    Monoclonal paraproteinemia    Obesity, unspecified    Other specified congenital anomaly of kidney    Other specified disorders of bladder    Other testicular hypofunction    Premature ejaculation    Sleep apnea    Ventral hernia     Patient Active Problem List   Diagnosis Date Noted   Status post total hip replacement, left 03/02/2019   Strain of knee 08/17/2018   Abdominal pain 02/11/2016   Notalgia 12/10/2015   History of transplantation, renal 12/10/2015   Acid reflux 07/10/2015   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   BPH with obstruction/lower urinary tract symptoms 07/10/2015   Hypogonadism in male 07/10/2015   Erectile dysfunction of organic origin 07/10/2015   Ventral hernia 12/05/2013   MGUS (monoclonal gammopathy of unknown significance) 06/28/2013   Focal and segmental hyalinosis  04/21/2013   H/O kidney transplant 01/28/2012   Adiposity 02/12/2011   Obstructive apnea 02/12/2011   Essential (primary) hypertension 09/05/2010    Past Surgical History:  Procedure Laterality Date   BACK SURGERY     c5/6 fusion after discectomy 3 weeks prior   COLONOSCOPY     polyp removal   dialysis port placement     and removal   HERNIA REPAIR  12/29/13   ventral   KIDNEY TRANSPLANT  01/2012   SPINE SURGERY     cervical fusion   TOTAL HIP ARTHROPLASTY Left 03/02/2019   Procedure: TOTAL HIP ARTHROPLASTY ANTERIOR APPROACH-LEFT;  Surgeon: Hessie Knows, MD;  Location: [facility] ORS;  Service: Orthopedics;  Laterality: Left;    Prior to Admission medications   Medication Sig Start Date End Date Taking? Authorizing Provider  acetaminophen (TYLENOL) 325 MG tablet Take 1-2 tablets (325-650 mg total) by mouth every 6 (six) hours as needed for mild pain (pain score 1-3 or temp > 100.5). 03/04/19   Duanne Guess, PA-C  atorvastatin (LIPITOR) 10 MG tablet Take 10 mg by mouth every evening.     [provider]  chlorthalidone (HYGROTON) 25 MG tablet Take 12.5 mg by mouth every morning. 05/10/21   [provider]  clonazePAM (KLONOPIN) 0.5 MG tablet Take 0.5 mg by mouth at bedtime.    [provider]  lidocaine-prilocaine (EMLA) cream Apply 1 application topically as needed. 05/29/21   Zara Council A, PA-C  metoprolol  tartrate (LOPRESSOR) 100 MG tablet Take 100 mg by mouth 2 (two) times daily.    [provider]  mycophenolate (CELLCEPT) 250 MG capsule Take 750 mg by mouth. 10/03/17   [provider]  tacrolimus (PROGRAF) 1 MG capsule Take 2-3 mg by mouth See admin instructions. Take 3 capsules (3 mg) by mouth in the morning & take 2 capsules (2 mg) by mouth at night    [provider]  tadalafil (CIALIS) 20 MG tablet Take 1 tablet (20 mg total) by mouth daily as needed for erectile dysfunction. 12/01/20   Zara Council A, PA-C   telmisartan (MICARDIS) 20 MG tablet  02/16/19   [provider]  testosterone cypionate (DEPOTESTOSTERONE CYPIONATE) 200 MG/ML injection Inject 0.75  mL into the muscle every 14 days 05/29/21   Zara Council A, PA-C    Allergies Hydralazine, Morphine and related, Prednisone, and Shellfish allergy  Family History  Problem Relation Age of Onset   Colon cancer Father 52   Heart attack Father    Stroke Brother    Colon cancer Maternal Grandfather    Kidney disease Neg Hx    Prostate cancer Neg Hx    Kidney cancer Neg Hx    Bladder Cancer Neg Hx     Social History Social History   Tobacco Use   Smoking status: Never   Smokeless tobacco: Never  Substance Use Topics   Alcohol use: Yes    Alcohol/week: 0.0 standard drinks    Comment: occ   Drug use: No    Review of Systems   Review of Systems  Constitutional:  Negative for fatigue.  Respiratory:  Positive for chest tightness. Negative for shortness of breath.   Cardiovascular:  Positive for chest pain. Negative for leg swelling.  Gastrointestinal:  Negative for abdominal pain, nausea and vomiting.  Musculoskeletal:  Negative for back pain and neck pain.  All other systems reviewed and are negative.  Physical Exam Updated Vital Signs BP (!) 161/92   Pulse 66   Resp 15   SpO2 100%   Physical Exam Vitals and nursing note reviewed.  Constitutional:      General: He is in acute distress.     Appearance: Normal appearance. He is diaphoretic.     Comments: Mild distress, appears uncomfortable  HENT:     Head: Normocephalic and atraumatic.  Eyes:     General: No scleral icterus.    Conjunctiva/sclera: Conjunctivae normal.  Cardiovascular:     Heart sounds: Normal heart sounds.  Pulmonary:     Effort: Pulmonary effort is normal. No respiratory distress.     Breath sounds: Normal breath sounds. No wheezing.  Abdominal:     Palpations: Abdomen is soft.  Musculoskeletal:        General: No deformity or signs  of injury.     Cervical back: Normal range of motion.     Right lower leg: No edema.     Left lower leg: No edema.  Skin:    Capillary Refill: Capillary refill takes less than 2 seconds.     Coloration: Skin is not jaundiced or pale.  Neurological:     General: No focal deficit present.     Mental Status: He is alert and oriented to person, place, and time. Mental status is at baseline.  Psychiatric:        Mood and Affect: Mood normal.        Behavior: Behavior normal.     LABS (all labs ordered  are listed, but only abnormal results are displayed)  Labs Reviewed  CBC WITH DIFFERENTIAL/PLATELET - Abnormal; Notable for the following components:      Result Value   Hemoglobin 17.7 (*)    MCHC 36.1 (*)    All other components within normal limits  RESP PANEL BY RT-PCR (FLU A&B, COVID) ARPGX2  HEMOGLOBIN A1C  PROTIME-INR  APTT  COMPREHENSIVE METABOLIC PANEL  LIPID PANEL  TROPONIN I (HIGH SENSITIVITY)   ____________________________________________  EKG  ST elevation in V3 and V4, normal sinus rhythm, normal axis, normal intervals ____________________________________________  RADIOLOGY Almeta Monas, personally viewed and evaluated these images (plain radiographs) as part of my medical decision making, as well as reviewing the written report by the radiologist.  ED MD interpretation:  n/a    ____________________________________________   PROCEDURES  Procedure(s) performed (including Critical Care):  Procedures   ____________________________________________   INITIAL IMPRESSION / ASSESSMENT AND PLAN / ED COURSE     Patient is a 55 year old male who presents with chest pain that started several hours ago.  EMS EKG is concerning for hyperacute T wave in V4.  STEMI alert was called in the field.  On arrival EKG now shows some ST elevation in V3 and V4.  Patient with ongoing ischemic type chest pain.  His vital signs are within normal limits other than some  hypertension.  Dr. Fletcher Anon evaluated the patient at bedside and decision was made to take him to the Cath Lab.  He was given aspirin by EMS, received heparin and nitroglycerin in the ED.      ____________________________________________   FINAL CLINICAL IMPRESSION(S) / ED DIAGNOSES  Final diagnoses:  ST elevation myocardial infarction (STEMI), unspecified artery [facility])     ED Discharge Orders     None        Note:  This document was prepared using Dragon voice recognition software and may include unintentional dictation errors.    Rada Hay, MD 08/27/21 [phone]

## 2021-08-27 NOTE — Consult Note (Signed)
Cardiology Consultation:   Patient ID: Chad Mccann MRN: [id number]; DOB: [birth year]  Admit date: 08/27/2021 Date of Consult: 08/27/2021  PCP:  [name], MD   [facility] Providers Cardiologist:  None   new Fletcher Anon)     Patient Profile:   Chad Mccann is a 55 y.o. male with a hx of kidney transplant who is being seen 08/27/2021 for the evaluation of anterior ST elevation myocardial infarction at the request of Dr. Starleen Blue.  History of Present Illness:   Chad Mccann is a 55 year old male with no previous cardiac history.  He has known history of kidney transplant in 2013 due to FSGS, obesity, essential hypertension, hyperlipidemia and GERD.  The patient called EMS due to substernal chest pain that started around 11 AM.  He had significant tightness feeling radiating to his left arm with tingling in the left arm.  The pain was associated with burping and shortness of breath.  EKG in the field showed 1 to 2 mm of anterior ST elevation and thus a code STEMI was activated.  The patient was still having significant chest discomfort in the ED with persistent EKG changes.  He was given aspirin and unfractionated heparin.  I recommended proceeding with emergent cardiac catheterization possible PCI.   Past Medical History:  Diagnosis Date   Benign prostatic hypertrophy    Bladder spasm    Erectile dysfunction    Frequency    GERD (gastroesophageal reflux disease)    Hypertension    Hypogonadism in male    Kidney 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    Kidney, malrotation    MGUS (monoclonal gammopathy of unknown significance)    Monoclonal paraproteinemia    Obesity, unspecified    Other specified congenital anomaly of kidney    Other specified disorders of bladder    Other testicular hypofunction    Premature ejaculation    Sleep apnea    Ventral hernia     Past Surgical History:  Procedure Laterality Date   BACK SURGERY     c5/6 fusion after discectomy 3 weeks prior   COLONOSCOPY      polyp removal   dialysis port placement     and removal   HERNIA REPAIR  12/29/13   ventral   KIDNEY TRANSPLANT  01/2012   SPINE SURGERY     cervical fusion   TOTAL HIP ARTHROPLASTY Left 03/02/2019   Procedure: TOTAL HIP ARTHROPLASTY ANTERIOR APPROACH-LEFT;  Surgeon: Hessie Knows, MD;  Location: [facility] ORS;  Service: Orthopedics;  Laterality: Left;     Home Medications:  Prior to Admission medications   Medication Sig Start Date End Date Taking? Authorizing Provider  acetaminophen (TYLENOL) 325 MG tablet Take 1-2 tablets (325-650 mg total) by mouth every 6 (six) hours as needed for mild pain (pain score 1-3 or temp > 100.5). 03/04/19   Duanne Guess, PA-C  atorvastatin (LIPITOR) 10 MG tablet Take 10 mg by mouth every evening.     [provider]  chlorthalidone (HYGROTON) 25 MG tablet Take 12.5 mg by mouth every morning. 05/10/21   [provider]  clonazePAM (KLONOPIN) 0.5 MG tablet Take 0.5 mg by mouth at bedtime.    [provider]  lidocaine-prilocaine (EMLA) cream Apply 1 application topically as needed. 05/29/21   Zara Council A, PA-C  metoprolol tartrate (LOPRESSOR) 100 MG tablet Take 100 mg by mouth 2 (two) times daily.    [provider]  mycophenolate (CELLCEPT) 250 MG capsule Take 750 mg by mouth. 10/03/17  [provider]  tacrolimus (PROGRAF) 1 MG capsule Take 2-3 mg by mouth See admin instructions. Take 3 capsules (3 mg) by mouth in the morning & take 2 capsules (2 mg) by mouth at night    [provider]  tadalafil (CIALIS) 20 MG tablet Take 1 tablet (20 mg total) by mouth daily as needed for erectile dysfunction. 12/01/20   Zara Council A, PA-C  telmisartan (MICARDIS) 20 MG tablet  02/16/19   [provider]  testosterone cypionate (DEPOTESTOSTERONE CYPIONATE) 200 MG/ML injection Inject 0.75  mL into the muscle every 14 days 05/29/21   Nori Riis, PA-C    Inpatient Medications: Scheduled Meds:   carvedilol  6.25 mg Oral BID WC   clonazePAM  0.5 mg Oral QHS   mycophenolate  750 mg Oral BID   rosuvastatin  40 mg Oral Daily   tacrolimus  2-3 mg Oral See admin instructions   Continuous Infusions:  sodium chloride     PRN Meds: nitroGLYCERIN  Allergies:    Allergies  Allergen Reactions   Hydralazine Other (See Comments)    Nose bleeds/light headed   Morphine And Related Other (See Comments)    "difficulty waking patient"   Prednisone     hallucinations   Shellfish Allergy Nausea And Vomiting    Social History:   Social History   Socioeconomic History   Marital status: Married    Spouse name: Not on file   Number of children: Not on file   Years of education: Not on file   Highest education level: Not on file  Occupational History   Occupation: Hotel manager  Tobacco Use   Smoking status: Never   Smokeless tobacco: Never  Substance and Sexual Activity   Alcohol use: Yes    Alcohol/week: 0.0 standard drinks    Comment: occ   Drug use: No   Sexual activity: Yes  Other Topics Concern   Not on file  Social History Narrative   Not on file   Social Determinants of Health   Financial Resource Strain: Not on file  Food Insecurity: Not on file  Transportation Needs: Not on file  Physical Activity: Not on file  Stress: Not on file  Social Connections: Not on file  Intimate Partner Violence: Not on file    Family History:    Family History  Problem Relation Age of Onset   Colon cancer Father 106   Heart attack Father    Stroke Brother    Colon cancer Maternal Grandfather    Kidney disease Neg Hx    Prostate cancer Neg Hx    Kidney cancer Neg Hx    Bladder Cancer Neg Hx      ROS:  Please see the history of present illness.   All other ROS reviewed and negative.     Physical Exam/Data:   Vitals:   08/27/21 1255 08/27/21 1309 08/27/21 1312 08/27/21 1415  BP: (!) 161/92   (!) 156/96  Pulse: 66   (!) 48  Resp: 15   14  Temp:  98.1 F  (36.7 C)  98 F (36.7 C)  TempSrc:  Oral  Oral  SpO2: 100%  94% 96%  Weight:    108 kg  Height:    '5\' 10"'$  (1.778 m)   No intake or output data in the 24 hours ending 08/27/21 1427 Last 3 Weights 08/27/2021 05/29/2021 12/01/2020  Weight (lbs) 238 lb 1.6 oz 233 lb 238 lb  Weight (kg) 108 kg  105.688 kg 107.956 kg     Body mass index is 34.16 kg/m.  General:  Well nourished, well developed, in no acute distress HEENT: normal Lymph: no adenopathy Neck: no JVD Endocrine:  No thryomegaly Vascular: No carotid bruits; FA pulses 2+ bilaterally without bruits  Cardiac:  normal S1, S2; RRR; no murmur  Lungs:  clear to auscultation bilaterally, no wheezing, rhonchi or rales  Abd: soft, nontender, no hepatomegaly  Ext: no edema Musculoskeletal:  No deformities, BUE and BLE strength normal and equal Skin: warm and dry  Neuro:  CNs 2-12 intact, no focal abnormalities noted Psych:  Normal affect   EKG:  The EKG was personally reviewed and demonstrates: Sinus rhythm with 1 to 2 mm of anterior ST elevation most pronounced in V3. Telemetry:  Telemetry was personally reviewed and demonstrates:    Relevant CV Studies:   Laboratory Data:  High Sensitivity Troponin:   Recent Labs  Lab 08/27/21 1243  TROPONINIHS 54*     Chemistry Recent Labs  Lab 08/27/21 1243  NA 137  K 4.5  CL 104  CO2 27  GLUCOSE 118*  BUN 22*  CREATININE 1.61*  CALCIUM 9.5  GFRNONAA 50*  ANIONGAP 6    Recent Labs  Lab 08/27/21 1243  PROT 7.8  ALBUMIN 4.5  AST 14*  ALT 13  ALKPHOS 57  BILITOT 1.1   Hematology Recent Labs  Lab 08/27/21 1243  WBC 5.5  RBC 5.42  HGB 17.7*  HCT 49.0  MCV 90.4  MCH 32.7  MCHC 36.1*  RDW 13.2  PLT 199   BNPNo results for input(s): BNP, PROBNP in the last 168 hours.  DDimer No results for input(s): DDIMER in the last 168 hours.   Radiology/Studies:  CARDIAC CATHETERIZATION  Result Date: 08/27/2021   Mid LM to Dist LM lesion is 30% stenosed.   Mid LAD lesion is  99% stenosed.   Ost LAD lesion is 30% stenosed.   Prox RCA lesion is 30% stenosed.   Prox RCA to Mid RCA lesion is 40% stenosed.   RPDA lesion is 30% stenosed.   A drug-eluting stent was successfully placed using a STENT ONYX FRONTIER 3.0X22.   Post intervention, there is a 0% residual stenosis. 1.  Severe one-vessel coronary artery disease with 99% thrombotic stenosis in the mid LAD which is the culprit for anterior STEMI.  Mild to moderate left main and RCA disease. 2.  Left ventricular angiography was not performed due to chronic kidney disease.  LVEDP was significantly elevated at 32 mmHg. 3.  Successful angioplasty and drug-eluting stent placement to the mid LAD. Recommendations: Dual antiplatelet therapy for at least 12 months. Aggressive treatment of risk factors..  I switched atorvastatin to high-dose rosuvastatin. I switched metoprolol to carvedilol.  An ACE inhibitor or ARB can be resumed once renal function is stable. I requested an echocardiogram. Gentle hydration for renal protection.     Assessment and Plan:   Anterior ST elevation myocardial infarction: Emergent cardiac catheterization was performed via the right radial artery which showed thrombotic subtotal occlusion of the mid LAD which was the culprit.  This was treated successfully with PCI and drug-eluting stent placement.  Recommend dual antiplatelet therapy with aspirin and ticagrelor for at least 12 months.  Recommend aggressive treatment of risk factors. Suspected acute systolic heart failure due to ischemic cardiomyopathy: I requested an echocardiogram.  I switched metoprolol to carvedilol.  ACE inhibitor or ARB can be resumed once we ensure stability of renal function.  His LVEDP was significantly elevated at 32 mmHg and thus will provide cautious gentle hydration for renal protection. Status post kidney transplant for FSGS: I consulted Dr. Holley Raring. Essential hypertension: Hold ARB for now until we ensure stability of renal function  with contrast exposure. Hyperlipidemia: I switch small dose atorvastatin to high-dose rosuvastatin given his myocardial infarction and residual coronary artery disease.   Risk Assessment/Risk Scores:   TIMI Risk Score for ST  Elevation MI:   The patient's TIMI risk score is 4, which indicates a 7.3% risk of all cause mortality at 30 days.{  For questions or updates, please contact [place] Please consult [email or web address] for contact info under    Signed, Kathlyn Sacramento, MD  08/27/2021 2:27 PM

## 2021-08-27 NOTE — Progress Notes (Signed)
PHARMACIST - PHYSICIAN COMMUNICATION  CONCERNING:  Enoxaparin (Lovenox) for DVT Prophylaxis    RECOMMENDATION: Patient was prescribed enoxaprin '40mg'$  q24 hours for VTE prophylaxis.   Filed Weights   08/27/21 1415  Weight: 108 kg (238 lb 1.6 oz)    Body mass index is 34.16 kg/m.  Estimated Creatinine Clearance: 63.8 mL/min (A) (by C-G formula based on SCr of 1.61 mg/dL (H)).   Based on [place] patient is candidate for enoxaparin 0.'5mg'$ /kg TBW SQ every 24 hours based on BMI being >30.    DESCRIPTION: Pharmacy has adjusted enoxaparin dose per [facility] policy.  Patient is now receiving enoxaparin 55 mg every 24 hours    Wynelle Cleveland, PharmD Pharmacy Resident  08/27/2021 3:53 PM

## 2021-08-27 NOTE — H&P (Signed)
History and Physical    ELDWIN Mccann [id number] DOB: 10-Jan-1966 DOA: 08/27/2021  PCP: Chad Athens, MD  Patient coming from: Home  I have personally briefly reviewed patient's old medical records in [place]  Chief Complaint: chest pain  HPI: Chad Mccann is a 55 y.o. male with medical history significant for history of FSGS s/p kidney transplant, hypertension, hyperlipidemia, OSA, GERD, BPH who presents with concerns of chest pain.  Patient woke up this morning feeling like he needed to burp and then around 11 AM began to feel more crushing pressure-like chest pain with radiation down the left arm not relieved with rest and decided to call EMS.  He was not doing anything extraneous at home.  He reports both father and brother having MI in their 31s He smokes occasional cigars.  Denies alcohol or illicit drug use.  He was found to have 1 mm ST elevation of lead V3 and V4 per ED documentation.  Unfortunately EKG has not crossed over on epic for my review.  He was taken emergently to Cath Lab by cardiologist Dr. Fletcher Anon and had DES to mid-LAD stent.   Hospitalist then called for admission post-PCI. Dr. Fletcher Anon has also discussed patient with nephrology Dr. Holley Raring who will see patient in consultation.  On my evaluation, patient continues to have left-sided chest pain but states is improved since before PCI and no longer has radiation down his left arm.  Review of Systems: Constitutional: No Weight Change, No Fever ENT/Mouth: No sore throat, No Rhinorrhea Eyes: No Eye Pain, No Vision Changes Cardiovascular: + Chest Pain, no SOB, No PND, No Dyspnea on Exertion, No Orthopnea, No Edema, No Palpitations Respiratory: No Cough, No Sputum Gastrointestinal: No Nausea, No Vomiting, No Diarrhea, No Pain Genitourinary: no Urinary Incontinence, No Urgency, No Flank Pain Musculoskeletal: No Arthralgias, No Myalgias Skin: No Skin Lesions, No Pruritus, Neuro: no Weakness, No  Numbness Psych: No Anxiety/Panic, No Depression, no decrease appetite Heme/Lymph: No Bruising, No Bleeding  Past Medical History:  Diagnosis Date   Benign prostatic hypertrophy    Bladder spasm    Erectile dysfunction    Frequency    GERD (gastroesophageal reflux disease)    Hypertension    Hypogonadism in male    Kidney 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    Kidney, malrotation    MGUS (monoclonal gammopathy of unknown significance)    Monoclonal paraproteinemia    Obesity, unspecified    Other specified congenital anomaly of kidney    Other specified disorders of bladder    Other testicular hypofunction    Premature ejaculation    Sleep apnea    Ventral hernia     Past Surgical History:  Procedure Laterality Date   BACK SURGERY     c5/6 fusion after discectomy 3 weeks prior   COLONOSCOPY     polyp removal   dialysis port placement     and removal   HERNIA REPAIR  12/29/13   ventral   KIDNEY TRANSPLANT  01/2012   SPINE SURGERY     cervical fusion   TOTAL HIP ARTHROPLASTY Left 03/02/2019   Procedure: TOTAL HIP ARTHROPLASTY ANTERIOR APPROACH-LEFT;  Surgeon: Hessie Knows, MD;  Location: [facility] ORS;  Service: Orthopedics;  Laterality: Left;     reports that he has never smoked. He has never used smokeless tobacco. He reports current alcohol use. He reports that he does not use drugs. Social History  Allergies  Allergen Reactions   Hydralazine Other (See Comments)    Nose  bleeds/light headed   Morphine And Related Other (See Comments)    "difficulty waking patient"   Prednisone     hallucinations   Shellfish Allergy Nausea And Vomiting    Family History  Problem Relation Age of Onset   Colon cancer Father 33   Heart attack Father    Stroke Brother    Colon cancer Maternal Grandfather    Kidney disease Neg Hx    Prostate cancer Neg Hx    Kidney cancer Neg Hx    Bladder Cancer Neg Hx      Prior to Admission medications   Medication Sig Start Date End Date Taking?  Authorizing Provider  acetaminophen (TYLENOL) 325 MG tablet Take 1-2 tablets (325-650 mg total) by mouth every 6 (six) hours as needed for mild pain (pain score 1-3 or temp > 100.5). 03/04/19   Duanne Guess, PA-C  atorvastatin (LIPITOR) 10 MG tablet Take 10 mg by mouth every evening.     [provider]  chlorthalidone (HYGROTON) 25 MG tablet Take 12.5 mg by mouth every morning. 05/10/21   [provider]  clonazePAM (KLONOPIN) 0.5 MG tablet Take 0.5 mg by mouth at bedtime.    [provider]  lidocaine-prilocaine (EMLA) cream Apply 1 application topically as needed. 05/29/21   Zara Council A, PA-C  metoprolol tartrate (LOPRESSOR) 100 MG tablet Take 100 mg by mouth 2 (two) times daily.    [provider]  mycophenolate (CELLCEPT) 250 MG capsule Take 750 mg by mouth. 10/03/17   [provider]  tacrolimus (PROGRAF) 1 MG capsule Take 2-3 mg by mouth See admin instructions. Take 3 capsules (3 mg) by mouth in the morning & take 2 capsules (2 mg) by mouth at night    [provider]  tadalafil (CIALIS) 20 MG tablet Take 1 tablet (20 mg total) by mouth daily as needed for erectile dysfunction. 12/01/20   Zara Council A, PA-C  telmisartan (MICARDIS) 20 MG tablet  02/16/19   [provider]  testosterone cypionate (DEPOTESTOSTERONE CYPIONATE) 200 MG/ML injection Inject 0.75  mL into the muscle every 14 days 05/29/21   Nori Riis, PA-C    Physical Exam: Vitals:   08/27/21 1246 08/27/21 1255 08/27/21 1309 08/27/21 1312  BP:  (!) 161/92    Pulse: 70 66    Resp:  15    Temp:   98.1 F (36.7 C)   TempSrc:   Oral   SpO2:  100%  94%    Constitutional: NAD, calm, comfortable, middle age male here sitting upright in bed with flat affect  Vitals:   08/27/21 1246 08/27/21 1255 08/27/21 1309 08/27/21 1312  BP:  (!) 161/92    Pulse: 70 66    Resp:  15    Temp:   98.1 F (36.7 C)   TempSrc:   Oral   SpO2:  100%  94%   Eyes:  PERRL, lids and conjunctivae normal ENMT: Mucous membranes are moist.  Neck: normal, supple Respiratory: clear to auscultation bilaterally, no wheezing, no crackles. Normal respiratory effort. No accessory muscle use.  Cardiovascular: Regular rate and rhythm, no murmurs / rubs / gallops. No extremity edema. 2+ pedal pulses. No carotid bruits. Right radial compressive wrap in place without any bleeding or drainage.  Abdomen: no tenderness, no masses palpated. . Bowel sounds positive.  Musculoskeletal: no clubbing / cyanosis. No joint deformity upper and lower extremities. Good ROM, no contractures. Normal muscle tone.  Skin: no rashes, lesions, ulcers. No  induration Neurologic: CN 2-12 grossly intact. Sensation intact, Strength 5/5 in all 4.  Psychiatric: Normal judgment and insight. Alert and oriented x 3. Flat affect.     Labs on Admission: I have personally reviewed following labs and imaging studies  CBC: Recent Labs  Lab 08/27/21 1243  WBC 5.5  NEUTROABS 3.9  HGB 17.7*  HCT 49.0  MCV 90.4  PLT 123XX123   Basic Metabolic Panel: Recent Labs  Lab 08/27/21 1243  NA 137  K 4.5  CL 104  CO2 27  GLUCOSE 118*  BUN 22*  CREATININE 1.61*  CALCIUM 9.5   GFR: CrCl cannot be calculated (Unknown ideal weight.). Liver Function Tests: Recent Labs  Lab 08/27/21 1243  AST 14*  ALT 13  ALKPHOS 57  BILITOT 1.1  PROT 7.8  ALBUMIN 4.5   No results for input(s): LIPASE, AMYLASE in the last 168 hours. No results for input(s): AMMONIA in the last 168 hours. Coagulation Profile: Recent Labs  Lab 08/27/21 1243  INR 1.1   Cardiac Enzymes: No results for input(s): CKTOTAL, CKMB, CKMBINDEX, TROPONINI in the last 168 hours. BNP (last 3 results) No results for input(s): PROBNP in the last 8760 hours. HbA1C: No results for input(s): HGBA1C in the last 72 hours. CBG: No results for input(s): GLUCAP in the last 168 hours. Lipid Profile: Recent Labs    08/27/21 1243  CHOL 143  HDL  28*  LDLCALC 82  TRIG 166*  CHOLHDL 5.1   Thyroid Function Tests: No results for input(s): TSH, T4TOTAL, FREET4, T3FREE, THYROIDAB in the last 72 hours. Anemia Panel: No results for input(s): VITAMINB12, FOLATE, FERRITIN, TIBC, IRON, RETICCTPCT in the last 72 hours. Urine analysis:    Component Value Date/Time   COLORURINE YELLOW (A) 02/26/2020 0923   APPEARANCEUR CLEAR (A) 02/26/2020 0923   LABSPEC 1.016 02/26/2020 0923   PHURINE 6.0 02/26/2020 0923   GLUCOSEU NEGATIVE 02/26/2020 0923   HGBUR NEGATIVE 02/26/2020 0923   BILIRUBINUR NEGATIVE 02/26/2020 0923   KETONESUR NEGATIVE 02/26/2020 0923   PROTEINUR 30 (A) 02/26/2020 0923   NITRITE NEGATIVE 02/26/2020 0923   LEUKOCYTESUR NEGATIVE 02/26/2020 0923    Radiological Exams on Admission: No results found.    Assessment/Plan  STEMI s/p DES to LAD  -continue aspirin and brilinta  -start Coreg 6.'25mg'$  BID per cardiology  - repeat EKG following persistent chest pain showing sinus bradycardia with non-specific T wave inversion V5 and V6. Continue to monitor closely. PRN Fentanyl q4hr.   AKI with hx of FSGS s/p kidney transplant  -gentle IV fluid resuscitation - Continue tacrolimus and mycophenolate -nephrology has been consulted by cardiology -Avoid nephrotoxic agent  HTN -start Coreg per cardiology -previous chlorthalidone, metoprolol and telmisartan held by cardiology pending repeat echocardiogram  HLD LDL of 82 with goal of 75 -continue high-intensity statin   OSA -continue CPAP   DVT prophylaxis:.Lovenox Code Status: Full Family Communication: Plan discussed with patient at bedside  disposition Plan: Home with at least 2 midnight stays  Consults called:  Admission status: inpatient    Level of care: ICU  Status is: Inpatient  Remains inpatient appropriate because:Inpatient level of care appropriate due to severity of illness  Dispo: The patient is from: Home              Anticipated d/c is to: Home               Patient currently is not medically stable to d/c.   Difficult to place patient No  Orene Desanctis DO [facility]   If 7PM-7AM, please contact night-coverage [email or web address]   08/27/2021, 2:15 PM

## 2021-08-27 NOTE — Progress Notes (Signed)
Responded to [facility] pg. Patient being cared for by medical staff. Wife arrived shortly after , patient taken to cath lab. Wife and son sitting in waiting area. Provided presence and support to the family. Will follow up.

## 2021-08-27 NOTE — ED Triage Notes (Signed)
Pt ems from home for chest pain that started 11:00 today. Pain 10/10 at home. Pt received 81 mg x 4, nitro sl x 1, nitro paste 1.5 in chest prior to arrival. Pt with hx kidney transplant 2013. Pain 5/10 now

## 2021-08-27 NOTE — Progress Notes (Signed)
Pt arrived to ICU bed 14 via bed from cath lab;  Pt alert and oriented x 4; Afebrile;   Sinus Bradycardia 40-50s on the monitor;  12-Lead EKG obtained via monitor Pt with 5/10 chest pain; MD notified;   NS infusing at 20 ml/hr;   Skin Swarm done with Judson Roch, RN  Family to be obtained from waiting room;   Burnis Medin, RN

## 2021-08-28 ENCOUNTER — Inpatient Hospital Stay (HOSPITAL_COMMUNITY)
Admit: 2021-08-28 | Discharge: 2021-08-28 | Disposition: A | Discharge status: Home / Self Care | Payer: BC Managed Care – PPO | Attending: Cardiovascular Disease | Admitting: Cardiovascular Disease

## 2021-08-28 ENCOUNTER — Encounter: Payer: Self-pay | Admitting: Cardiovascular Disease

## 2021-08-28 DIAGNOSIS — I2109 ST elevation (STEMI) myocardial infarction involving other coronary artery of anterior wall: Secondary | ICD-10-CM

## 2021-08-28 LAB — GLUCOSE, CAPILLARY: Glucose-Capillary: 98 mg/dL (ref 70–99)

## 2021-08-28 LAB — BASIC METABOLIC PANEL
Anion gap: 5 (ref 5–15)
BUN: 17 mg/dL (ref 6–20)
CO2: 28 mmol/L (ref 22–32)
Calcium: 9.3 mg/dL (ref 8.9–10.3)
Chloride: 103 mmol/L (ref 98–111)
Creatinine, Ser: 1.3 mg/dL — ABNORMAL HIGH (ref 0.61–1.24)
GFR, Estimated: >60 mL/min (ref >60)
Glucose, Bld: 102 mg/dL — ABNORMAL HIGH (ref 70–99)
Potassium: 4.2 mmol/L (ref 3.5–5.1)
Sodium: 136 mmol/L (ref 135–145)

## 2021-08-28 LAB — CBC
HCT: 44.4 % (ref 39.0–52.0)
Hemoglobin: 16.1 g/dL (ref 13.0–17.0)
MCH: 33 pg (ref 26.0–34.0)
MCHC: 36.3 g/dL — ABNORMAL HIGH (ref 30.0–36.0)
MCV: 91 fL (ref 80.0–100.0)
Platelets: 180 10*3/uL (ref 150–400)
RBC: 4.88 MIL/uL (ref 4.22–5.81)
RDW: 13.2 % (ref 11.5–15.5)
WBC: 7.9 10*3/uL (ref 4.0–10.5)
nRBC: 0 % (ref 0.0–0.2)

## 2021-08-28 LAB — HEMOGLOBIN A1C
Hgb A1c MFr Bld: 5.5 % (ref 4.8–5.6)
Mean Plasma Glucose: 111 mg/dL

## 2021-08-28 LAB — ECHOCARDIOGRAM COMPLETE
AR max vel: 2.07 cm2
AV Area VTI: 2.26 cm2
AV Area mean vel: 1.98 cm2
AV Mean grad: 7 mmHg
AV Peak grad: 12.4 mmHg
Ao pk vel: 1.76 m/s
Area-P 1/2: 2.55 cm2
Calc EF: 52.2 %
Height: 70 in
MV VTI: 2.88 cm2
S' Lateral: 3.1 cm
Single Plane A2C EF: 53.2 %
Single Plane A4C EF: 53.3 %
Weight: 3809.55 oz

## 2021-08-28 LAB — HEPATIC FUNCTION PANEL
ALT: 14 U/L (ref 0–44)
AST: 25 U/L (ref 15–41)
Albumin: 3.7 g/dL (ref 3.5–5.0)
Alkaline Phosphatase: 52 U/L (ref 38–126)
Bilirubin, Direct: 0.3 mg/dL — ABNORMAL HIGH (ref 0.0–0.2)
Indirect Bilirubin: 1.5 mg/dL — ABNORMAL HIGH (ref 0.3–0.9)
Total Bilirubin: 1.8 mg/dL — ABNORMAL HIGH (ref 0.3–1.2)
Total Protein: 7 g/dL (ref 6.5–8.1)

## 2021-08-28 MED ORDER — TACROLIMUS 1 MG PO CAPS
3.0000 mg | ORAL_CAPSULE | Freq: Every day | ORAL | Status: DC
  Administered 2021-08-29: 3 mg via ORAL
  Filled 2021-08-28: qty 3

## 2021-08-28 MED ORDER — TACROLIMUS 1 MG PO CAPS
2.0000 mg | ORAL_CAPSULE | Freq: Every evening | ORAL | Status: DC
  Administered 2021-08-28: 2 mg via ORAL
  Filled 2021-08-28 (×2): qty 2

## 2021-08-28 MED ORDER — CHLORHEXIDINE GLUCONATE CLOTH 2 % EX PADS
6.0000 | MEDICATED_PAD | Freq: Every day | CUTANEOUS | Status: DC
  Administered 2021-08-28 – 2021-08-29 (×2): 6 via TOPICAL

## 2021-08-28 MED ORDER — CLONIDINE HCL 0.1 MG PO TABS
0.2000 mg | ORAL_TABLET | Freq: Once | ORAL | Status: AC
  Administered 2021-08-28: 0.2 mg via ORAL
  Filled 2021-08-28: qty 2

## 2021-08-28 NOTE — Progress Notes (Signed)
[name] [place] NAME: Chad Mccann    MR#:  [id number]  DATE OF BIRTH:  Jan 29, 1966  SUBJECTIVE:   patient denies any chest pain. Overall doing well. Ambulating in the room. No family at bedside. REVIEW OF SYSTEMS:   Review of Systems  Constitutional:  Negative for chills, fever and weight loss.  HENT:  Negative for ear discharge, ear pain and nosebleeds.   Eyes:  Negative for blurred vision, pain and discharge.  Respiratory:  Negative for sputum production, shortness of breath, wheezing and stridor.   Cardiovascular:  Negative for chest pain, palpitations, orthopnea and PND.  Gastrointestinal:  Negative for abdominal pain, diarrhea, nausea and vomiting.  Genitourinary:  Negative for frequency and urgency.  Musculoskeletal:  Negative for back pain and joint pain.  Neurological:  Negative for sensory change, speech change, focal weakness and weakness.  Psychiatric/Behavioral:  Negative for depression and hallucinations. The patient is not nervous/anxious.   Tolerating Diet:yes Tolerating PT: not needed  DRUG ALLERGIES:   Allergies  Allergen Reactions   Hydralazine Other (See Comments)    Nose bleeds/light headed   Morphine And Related Other (See Comments)    "difficulty waking patient"   Prednisone     hallucinations   Shellfish Allergy Nausea And Vomiting    VITALS:  Blood pressure (!) 146/73, pulse 70, temperature 98.5 F (36.9 C), temperature source Oral, resp. rate 16, height '5\' 10"'$  (1.778 m), weight 108 kg, SpO2 96 %.  PHYSICAL EXAMINATION:   Physical Exam  GENERAL:  55 y.o.-year-old patient lying in the bed with no acute distress.  LUNGS: Normal breath sounds bilaterally, no wheezing, rales, rhonchi. No use of accessory muscles of respiration.  CARDIOVASCULAR: S1, S2 normal. No murmurs, rubs, or gallops.  ABDOMEN: Soft, nontender, nondistended. Bowel sounds present. No organomegaly or mass.  EXTREMITIES: No  cyanosis, clubbing or edema b/l.    NEUROLOGIC:  No focal Motor or sensory deficits b/l.   PSYCHIATRIC:  patient is alert and oriented x 3.  SKIN: No obvious rash, lesion, or ulcer.   LABORATORY PANEL:  CBC Recent Labs  Lab 08/28/21 0420  WBC 7.9  HGB 16.1  HCT 44.4  PLT 180    Chemistries  Recent Labs  Lab 08/28/21 0420  NA 136  K 4.2  CL 103  CO2 28  GLUCOSE 102*  BUN 17  CREATININE 1.30*  CALCIUM 9.3  AST 25  ALT 14  ALKPHOS 52  BILITOT 1.8*   Cardiac Enzymes No results for input(s): TROPONINI in the last 168 hours. RADIOLOGY:  CARDIAC CATHETERIZATION  Result Date: 08/27/2021   Mid LM to Dist LM lesion is 30% stenosed.   Mid LAD lesion is 99% stenosed.   Ost LAD lesion is 30% stenosed.   Prox RCA lesion is 30% stenosed.   Prox RCA to Mid RCA lesion is 40% stenosed.   RPDA lesion is 30% stenosed.   A drug-eluting stent was successfully placed using a STENT ONYX FRONTIER 3.0X22.   Post intervention, there is a 0% residual stenosis. 1.  Severe one-vessel coronary artery disease with 99% thrombotic stenosis in the mid LAD which is the culprit for anterior STEMI.  Mild to moderate left main and RCA disease. 2.  Left ventricular angiography was not performed due to chronic kidney disease.  LVEDP was significantly elevated at 32 mmHg. 3.  Successful angioplasty and drug-eluting stent placement to the mid LAD. Recommendations: Dual antiplatelet therapy for at least 12  months. Aggressive treatment of risk factors..  I switched atorvastatin to high-dose rosuvastatin. I switched metoprolol to carvedilol.  An ACE inhibitor or ARB can be resumed once renal function is stable. I requested an echocardiogram. Gentle hydration for renal protection.   ASSESSMENT AND PLAN:   Chad Mccann is a 55 y.o. male with medical history significant for history of FSGS s/p kidney transplant, hypertension, hyperlipidemia, OSA, GERD, BPH who presents with concerns of chest pain.  Patient woke up this  morning feeling like he needed to burp and then around 11 AM began to feel more crushing pressure-like chest pain with radiation down the left arm not relieved with rest . W/u showed STEMI  Acute anterior wall STEMI s/p DES to LAD  -continue aspirin and brilinta  -Coreg 6.'25mg'$  BID per cardiology Dr Fletcher Anon -s/p Cath  1.  Severe one-vessel coronary artery disease with 99% thrombotic stenosis in the mid LAD which is the culprit for anterior STEMI.  Mild to moderate left main and RCA disease. 2.  Left ventricular angiography was not performed due to chronic kidney disease.  LVEDP was significantly elevated at 32 mmHg. 3.  Successful angioplasty and drug-eluting stent placement to the mid LAD. -cont statins   AKI with hx of FSGS s/p kidney transplant  - Continue tacrolimus and mycophenolate -nephrology has been consulted by cardiology -Avoid nephrotoxic agent   HTN -Coreg per cardiology -previous chlorthalidone, metoprolol and telmisartan held by cardiology pending repeat echocardiogram   HLD LDL of 82 with goal of 75 -continue high-intensity statin    OSA -continue CPAP     Procedures: cath Family communication : none today Consults : cardiology, nephrology CODE STATUS: full DVT Prophylaxis : enoxaparin Level of care: Stepdown Status is: Inpatient  Remains inpatient appropriate because:Inpatient level of care appropriate due to severity of illness  Dispo: The patient is from: Home              Anticipated d/c is to: Home              Patient currently is not medically stable to d/c.   Patient overall improving. Will await cardiology input regarding transferring out of ICU and discharge plans.       TOTAL TIME TAKING CARE OF THIS PATIENT: 25 minutes.  >50% time spent on counselling and coordination of care  Note: This dictation was prepared with Dragon dictation along with smaller phrase technology. Any transcriptional errors that result from this process are  unintentional.  Fritzi Mandes M.D    [facility]   CC: Primary care physician; [name], MD Patient ID: Chad Mccann, male   DOB: Jan 15, 1966, 55 y.o.   MRN: [id number]

## 2021-08-28 NOTE — Progress Notes (Signed)
Progress Note  Patient Name: Chad Mccann Date of Encounter: 08/28/2021  [facility] Cardiologist: New - Arida  Subjective   No further chest pain.  Denies shortness of breath.  Some palpitations.  Inpatient Medications    Scheduled Meds:  aspirin  81 mg Oral Daily   carvedilol  6.25 mg Oral BID WC   clonazePAM  0.5 mg Oral QHS   enoxaparin (LOVENOX) injection  55 mg Subcutaneous Q24H   mycophenolate  750 mg Oral BID   rosuvastatin  40 mg Oral Daily   sodium chloride flush  3 mL Intravenous Q12H   tacrolimus  2 mg Oral BID   ticagrelor  90 mg Oral BID   Continuous Infusions:  sodium chloride     sodium chloride Stopped (08/28/21 0114)   PRN Meds: sodium chloride, acetaminophen, fentaNYL (SUBLIMAZE) injection, nitroGLYCERIN, ondansetron (ZOFRAN) IV, sodium chloride flush   Vital Signs    Vitals:   08/28/21 0600 08/28/21 0700 08/28/21 0800 08/28/21 1226  BP: (!) 123/96 (!) 142/89 (!) 148/80 (!) 146/73  Pulse: 67 (!) 53 66 70  Resp:   18 16  Temp:      TempSrc:      SpO2: 97% 99% 97% 96%  Weight:      Height:        Intake/Output Summary (Last 24 hours) at 08/28/2021 1427 Last data filed at 08/28/2021 1300 Gross per 24 hour  Intake 1091.8 ml  Output 1800 ml  Net -708.2 ml   Last 3 Weights 08/27/2021 05/29/2021 12/01/2020  Weight (lbs) 238 lb 1.6 oz 233 lb 238 lb  Weight (kg) 108 kg 105.688 kg 107.956 kg      Telemetry    NSR - Personally Reviewed  ECG    Normal sinus rhythm with septal infarct and anterolateral T wave inversions. - Personally Reviewed  Physical Exam   GEN: No acute distress.   Neck: No JVD Cardiac: RRR, no murmurs, rubs, or gallops.  Respiratory: Clear to auscultation bilaterally. GI: Soft, nontender, non-distended  MS: No edema; No deformity.  Right radial arteriotomy site covered with clean dressing.  No hematoma. Neuro:  Nonfocal  Psych: Normal affect   Labs    High Sensitivity Troponin:   Recent Labs  Lab  08/27/21 1243 08/27/21 1445 08/27/21 1637 08/27/21 1841  TROPONINIHS 54* 102* 157* 395*      Chemistry Recent Labs  Lab 08/27/21 1243 08/28/21 0420  NA 137 136  K 4.5 4.2  CL 104 103  CO2 27 28  GLUCOSE 118* 102*  BUN 22* 17  CREATININE 1.61* 1.30*  CALCIUM 9.5 9.3  PROT 7.8 7.0  ALBUMIN 4.5 3.7  AST 14* 25  ALT 13 14  ALKPHOS 57 52  BILITOT 1.1 1.8*  GFRNONAA 50* >60  ANIONGAP 6 5     Hematology Recent Labs  Lab 08/27/21 1243 08/28/21 0420  WBC 5.5 7.9  RBC 5.42 4.88  HGB 17.7* 16.1  HCT 49.0 44.4  MCV 90.4 91.0  MCH 32.7 33.0  MCHC 36.1* 36.3*  RDW 13.2 13.2  PLT 199 180    BNPNo results for input(s): BNP, PROBNP in the last 168 hours.   DDimer No results for input(s): DDIMER in the last 168 hours.   Radiology    CARDIAC CATHETERIZATION  Result Date: 08/27/2021   Mid LM to Dist LM lesion is 30% stenosed.   Mid LAD lesion is 99% stenosed.   Ost LAD lesion is 30% stenosed.   Prox RCA  lesion is 30% stenosed.   Prox RCA to Mid RCA lesion is 40% stenosed.   RPDA lesion is 30% stenosed.   A drug-eluting stent was successfully placed using a STENT ONYX FRONTIER 3.0X22.   Post intervention, there is a 0% residual stenosis. 1.  Severe one-vessel coronary artery disease with 99% thrombotic stenosis in the mid LAD which is the culprit for anterior STEMI.  Mild to moderate left main and RCA disease. 2.  Left ventricular angiography was not performed due to chronic kidney disease.  LVEDP was significantly elevated at 32 mmHg. 3.  Successful angioplasty and drug-eluting stent placement to the mid LAD. Recommendations: Dual antiplatelet therapy for at least 12 months. Aggressive treatment of risk factors..  I switched atorvastatin to high-dose rosuvastatin. I switched metoprolol to carvedilol.  An ACE inhibitor or ARB can be resumed once renal function is stable. I requested an echocardiogram. Gentle hydration for renal protection.   ECHOCARDIOGRAM COMPLETE  Result Date:  08/28/2021    ECHOCARDIOGRAM REPORT   Patient Name:   Chad Mccann Date of Exam: 08/28/2021 Medical Rec #:  [id number]        Height:       70.0 in Accession #:    [id number]       Weight:       238.1 lb Date of Birth:  Jul 16, 1966        BSA:          2.248 m Patient Age:    55 years         BP:           148/80 mmHg Patient Gender: M                HR:           61 bpm. Exam Location:  [facility] Procedure: 2D Echo, Color Doppler and Cardiac Doppler Indications:     I21.9 Acute myocardial infarction, unspecified  History:         Patient has no prior history of Echocardiogram examinations.                  Risk Factors:Hypertension and Sleep Apnea.  Sonographer:     Charmayne Sheer Referring Phys:  [id number] ARIDA Diagnosing Phys: Nelva Bush MD  Sonographer Comments: Suboptimal parasternal window. IMPRESSIONS  1. Left ventricular ejection fraction, by estimation, is 55 to 60%. The left ventricle has normal function. The left ventricle has no regional wall motion abnormalities. Left ventricular diastolic parameters are consistent with Grade II diastolic dysfunction (pseudonormalization).  2. Right ventricular systolic function is normal. The right ventricular size is normal.  3. The mitral valve is normal in structure. Trivial mitral valve regurgitation. No evidence of mitral stenosis.  4. The aortic valve is tricuspid. Aortic valve regurgitation is not visualized. No aortic stenosis is present.  5. The inferior vena cava is normal in size with greater than 50% respiratory variability, suggesting right atrial pressure of 3 mmHg. FINDINGS  Left Ventricle: Left ventricular ejection fraction, by estimation, is 55 to 60%. The left ventricle has normal function. The left ventricle has no regional wall motion abnormalities. The left ventricular internal cavity size was normal in size. There is  no left ventricular hypertrophy. Left ventricular diastolic parameters are consistent with Grade II diastolic dysfunction  (pseudonormalization). Right Ventricle: The right ventricular size is normal. No increase in right ventricular wall thickness. Right ventricular systolic function is normal. Left Atrium: Left atrial size was normal in  size. Right Atrium: Right atrial size was normal in size. Pericardium: There is no evidence of pericardial effusion. Mitral Valve: The mitral valve is normal in structure. Trivial mitral valve regurgitation. No evidence of mitral valve stenosis. MV peak gradient, 3.3 mmHg. The mean mitral valve gradient is 1.0 mmHg. Tricuspid Valve: The tricuspid valve is grossly normal. Tricuspid valve regurgitation is trivial. Aortic Valve: The aortic valve is tricuspid. Aortic valve regurgitation is not visualized. No aortic stenosis is present. Aortic valve mean gradient measures 7.0 mmHg. Aortic valve peak gradient measures 12.4 mmHg. Aortic valve area, by VTI measures 2.26  cm. Pulmonic Valve: The pulmonic valve was grossly normal. Pulmonic valve regurgitation is trivial. No evidence of pulmonic stenosis. Aorta: The aortic root is normal in size and structure. Pulmonary Artery: The pulmonary artery is not well seen. Venous: The inferior vena cava is normal in size with greater than 50% respiratory variability, suggesting right atrial pressure of 3 mmHg. IAS/Shunts: No atrial level shunt detected by color flow Doppler.  LEFT VENTRICLE PLAX 2D LVIDd:         4.50 cm      Diastology LVIDs:         3.10 cm      LV e' medial:    5.33 cm/s LV PW:         1.00 cm      LV E/e' medial:  13.5 LV IVS:        1.00 cm      LV e' lateral:   6.64 cm/s LVOT diam:     2.00 cm      LV E/e' lateral: 10.8 LV SV:         77 LV SV Index:   34 LVOT Area:     3.14 cm  LV Volumes (MOD) LV vol d, MOD A2C: 106.0 ml LV vol d, MOD A4C: 113.0 ml LV vol s, MOD A2C: 49.6 ml LV vol s, MOD A4C: 52.8 ml LV SV MOD A2C:     56.4 ml LV SV MOD A4C:     113.0 ml LV SV MOD BP:      58.1 ml RIGHT VENTRICLE RV Basal diam:  3.50 cm RV Mid diam:    4.60  cm TAPSE (M-mode): 2.9 cm LEFT ATRIUM             Index       RIGHT ATRIUM           Index LA diam:        4.20 cm 1.87 cm/m  RA Area:     15.40 cm LA Vol (A2C):   49.6 ml 22.07 ml/m RA Volume:   42.70 ml  19.00 ml/m LA Vol (A4C):   32.7 ml 14.55 ml/m LA Biplane Vol: 43.8 ml 19.49 ml/m  AORTIC VALVE                    PULMONIC VALVE AV Area (Vmax):    2.07 cm     PV Vmax:       1.20 m/s AV Area (Vmean):   1.98 cm     PV Vmean:      86.700 cm/s AV Area (VTI):     2.26 cm     PV VTI:        0.238 m AV Vmax:           176.00 cm/s  PV Peak grad:  5.8 mmHg AV Vmean:  129.000 cm/s PV Mean grad:  3.0 mmHg AV VTI:            0.340 m AV Peak Grad:      12.4 mmHg AV Mean Grad:      7.0 mmHg LVOT Vmax:         116.00 cm/s LVOT Vmean:        81.400 cm/s LVOT VTI:          0.245 m LVOT/AV VTI ratio: 0.72  AORTA Ao Root diam: 3.10 cm MITRAL VALVE MV Area (PHT): 2.55 cm    SHUNTS MV Area VTI:   2.88 cm    Systemic VTI:  0.24 m MV Peak grad:  3.3 mmHg    Systemic Diam: 2.00 cm MV Mean grad:  1.0 mmHg MV Vmax:       0.90 m/s MV Vmean:      56.6 cm/s MV Decel Time: 297 msec MV E velocity: 72.00 cm/s MV A velocity: 67.70 cm/s MV E/A ratio:  1.06 Harrell Gave Natasa Stigall MD Electronically signed by Nelva Bush MD Signature Date/Time: 08/28/2021/1:42:50 PM    Final     Cardiac Studies   See above  Patient Profile     55 y.o. male man with history of hypertension, FSGS status post renal transplant, MGUS, and sleep apnea, admitted with STEMI status post PCI to the LAD  Assessment & Plan    Anterior STEMI: No further chest pain.  Telemetry without significant ectopy.  Echo with normal LVEF and no wall motion abnormality. Okay to transfer out of the ICU to telemetry. Continue aspirin and ticagrelor for at least 12 months. Continue carvedilol 6.25 mg twice daily and rosuvastatin 40 mg daily.  Status post renal transplant: Creatinine improved since yesterday but not quite back to baseline. Per IM.  Defer  adding ACE inhibitor/ARB in the setting of mild AKI on presentation.  For questions or updates, please contact [place] Please consult [email or web address] for contact info under [facility].     Signed, Nelva Bush, MD  08/28/2021, 2:27 PM

## 2021-08-28 NOTE — Plan of Care (Signed)
  Problem: Education: Goal: Understanding of cardiac disease, CV risk reduction, and recovery process will improve Outcome: Progressing Goal: Understanding of medication regimen will improve Outcome: Progressing Goal: Individualized Educational Video(s) Outcome: Progressing   Problem: Activity: Goal: Ability to tolerate increased activity will improve Outcome: Progressing   Problem: Cardiac: Goal: Ability to achieve and maintain adequate cardiopulmonary perfusion will improve Outcome: Progressing Goal: Vascular access site(s) Level 0-1 will be maintained Outcome: Progressing   Problem: Health Behavior/Discharge Planning: Goal: Ability to safely manage health-related needs after discharge will improve Outcome: Progressing   Problem: Health Behavior/Discharge Planning: Goal: Ability to manage health-related needs will improve Outcome: Progressing   Problem: Pain Managment: Goal: General experience of comfort will improve Outcome: Progressing

## 2021-08-28 NOTE — Consult Note (Signed)
[facility] CONSULT NOTE    Date: 08/28/2021                  Patient Name:  Chad Mccann  MRN: [id number]  DOB: 10/28/1966  Age / Sex: 55 y.o., male         PCP: [name], MD                 Service Requesting Consult: Cardiology                 Reason for Consult: Management of renal transplantation in the setting of contrast exposure            History of Present Illness: Patient is a 55 y.o. 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  Yesterday he developed severe chest pain for which he eventually came to the hospital.  Urgently taken to the Cath Lab and status post PCI to the LAD.  He has been started on ticagrelor subsequent to this.  Patient is followed by the [facility] transplant division.  His initial creatinine was 1.6 but now down to 1.3.  He relates to me that his baseline creatinine is 1.2.  He is followed by Dr. Ardyth Man of [facility] nephrology.  He has been on stable doses of tacrolimus and mycophenolate.  He reports adherence with these medications.  He was not having any chest pain this AM.   Medications: Outpatient medications: Medications Prior to Admission  Medication Sig Dispense Refill Last Dose   acetaminophen (TYLENOL) 325 MG tablet Take 1-2 tablets (325-650 mg total) by mouth every 6 (six) hours as needed for mild pain (pain score 1-3 or temp > 100.5).      atorvastatin (LIPITOR) 10 MG tablet Take 10 mg by mouth every evening.       chlorthalidone (HYGROTON) 25 MG tablet Take 12.5 mg by mouth every morning.      clonazePAM (KLONOPIN) 0.5 MG tablet Take 0.5 mg by mouth at bedtime.      lidocaine-prilocaine (EMLA) cream Apply 1 application topically as needed. 30 g 0    metoprolol tartrate (LOPRESSOR) 100 MG tablet Take 100 mg by mouth 2 (two)  times daily.      mycophenolate (CELLCEPT) 250 MG capsule Take 750 mg by mouth.      tacrolimus (PROGRAF) 1 MG capsule Take 2 mg by mouth 2 (two) times daily.      tadalafil (CIALIS) 20 MG tablet Take 1 tablet (20 mg total) by mouth daily as needed for erectile dysfunction. 6 tablet 11    telmisartan (MICARDIS) 20 MG tablet       testosterone cypionate (DEPOTESTOSTERONE CYPIONATE) 200 MG/ML injection Inject 0.75  mL into the muscle every 14 days 10 mL 0     Current medications: Current Facility-Administered Medications  Medication Dose Route Frequency Provider Last Rate Last Admin   0.9 %  sodium chloride infusion  250 mL Intravenous PRN Kathlyn Sacramento A, MD       0.9 %  sodium chloride infusion   Intravenous Continuous Tu, Ching T, DO   Stopped at 08/28/21 0114   acetaminophen (TYLENOL) tablet 650 mg  650 mg Oral Q4H PRN Wellington Hampshire, MD       aspirin chewable tablet 81 mg  81 mg Oral Daily  Wellington Hampshire, MD   81 mg at 08/28/21 0926   carvedilol (COREG) tablet 6.25 mg  6.25 mg Oral BID WC Kathlyn Sacramento A, MD   6.25 mg at 08/28/21 [id number]   clonazePAM (KLONOPIN) tablet 0.5 mg  0.5 mg Oral QHS Kathlyn Sacramento A, MD   0.5 mg at 08/27/21 2231   enoxaparin (LOVENOX) injection 55 mg  55 mg Subcutaneous Q24H Wynelle Cleveland, RPH   55 mg at 08/28/21 0929   fentaNYL (SUBLIMAZE) injection 12.5 mcg  12.5 mcg Intravenous Q4H PRN Tu, Ching T, DO   12.5 mcg at 08/27/21 1559   mycophenolate (CELLCEPT) capsule 750 mg  750 mg Oral BID Kathlyn Sacramento A, MD   750 mg at 08/28/21 0926   nitroGLYCERIN (NITROSTAT) SL tablet 0.4 mg  0.4 mg Sublingual Q5 min PRN Kathlyn Sacramento A, MD   0.4 mg at 08/27/21 1253   ondansetron (ZOFRAN) injection 4 mg  4 mg Intravenous Q6H PRN Wellington Hampshire, MD       rosuvastatin (CRESTOR) tablet 40 mg  40 mg Oral Daily Kathlyn Sacramento A, MD   40 mg at 08/28/21 0927   sodium chloride flush (NS) 0.9 % injection 3 mL  3 mL Intravenous Q12H Kathlyn Sacramento A, MD   3 mL at  08/28/21 0931   sodium chloride flush (NS) 0.9 % injection 3 mL  3 mL Intravenous PRN Wellington Hampshire, MD       tacrolimus (PROGRAF) capsule 2 mg  2 mg Oral BID Kathlyn Sacramento A, MD   2 mg at 08/28/21 0926   ticagrelor (BRILINTA) tablet 90 mg  90 mg Oral BID Wellington Hampshire, MD   90 mg at 08/28/21 [id number]      Allergies: Allergies  Allergen Reactions   Hydralazine Other (See Comments)    Nose bleeds/light headed   Morphine And Related Other (See Comments)    "difficulty waking patient"   Prednisone     hallucinations   Shellfish Allergy Nausea And Vomiting      Past Medical History: Past Medical History:  Diagnosis Date   Benign prostatic hypertrophy    Bladder spasm    Erectile dysfunction    Frequency    GERD (gastroesophageal reflux disease)    Hypertension    Hypogonadism in male    Kidney 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    Kidney, malrotation    MGUS (monoclonal gammopathy of unknown significance)    Monoclonal paraproteinemia    Obesity, unspecified    Other specified congenital anomaly of kidney    Other specified disorders of bladder    Other testicular hypofunction    Premature ejaculation    Sleep apnea    Ventral hernia      Past Surgical History: Past Surgical History:  Procedure Laterality Date   BACK SURGERY     c5/6 fusion after discectomy 3 weeks prior   COLONOSCOPY     polyp removal   CORONARY/GRAFT ACUTE MI REVASCULARIZATION N/A 08/27/2021   Procedure: Coronary/Graft Acute MI Revascularization;  Surgeon: Wellington Hampshire, MD;  Location: [place] [facility];  Service: Cardiovascular;  Laterality: N/A;   dialysis port placement     and removal   HERNIA REPAIR  12/29/13   ventral   KIDNEY TRANSPLANT  01/2012   LEFT HEART CATH AND CORONARY ANGIOGRAPHY N/A 08/27/2021   Procedure: LEFT HEART CATH AND CORONARY ANGIOGRAPHY;  Surgeon: Wellington Hampshire, MD;  Location: [place] [facility];  Service: Cardiovascular;  Laterality:  N/A;   SPINE SURGERY      cervical fusion   TOTAL HIP ARTHROPLASTY Left 03/02/2019   Procedure: TOTAL HIP ARTHROPLASTY ANTERIOR APPROACH-LEFT;  Surgeon: Hessie Knows, MD;  Location: [facility];  Service: Orthopedics;  Laterality: Left;     Family History: Family History  Problem Relation Age of Onset   Colon cancer Father 59   Heart attack Father    Stroke Brother    Colon cancer Maternal Grandfather    Kidney disease Neg Hx    Prostate cancer Neg Hx    Kidney cancer Neg Hx    Bladder Cancer Neg Hx      Social History: Social History   Socioeconomic History   Marital status: Married    Spouse name: Not on file   Number of children: Not on file   Years of education: Not on file   Highest education level: Not on file  Occupational History   Occupation: Hotel manager  Tobacco Use   Smoking status: Never   Smokeless tobacco: Never  Substance and Sexual Activity   Alcohol use: Yes    Alcohol/week: 0.0 standard drinks    Comment: occ   Drug use: No   Sexual activity: Yes  Other Topics Concern   Not on file  Social History Narrative   Not on file   Social Determinants of Health   Financial Resource Strain: Not on file  Food Insecurity: Not on file  Transportation Needs: Not on file  Physical Activity: Not on file  Stress: Not on file  Social Connections: Not on file  Intimate Partner Violence: Not on file     Review of Systems: Review of Systems  Constitutional:  Negative for chills, fever and malaise/fatigue.  HENT:  Negative for congestion, hearing loss and tinnitus.   Eyes:  Negative for blurred vision and double vision.  Respiratory:  Negative for cough, sputum production and shortness of breath.   Cardiovascular:  Positive for chest pain. Negative for palpitations and orthopnea.  Gastrointestinal:  Negative for diarrhea, nausea and vomiting.  Genitourinary:  Negative for dysuria, frequency and urgency.  Musculoskeletal:  Negative for myalgias.  Skin:  Negative for  itching and rash.  Neurological:  Negative for dizziness and focal weakness.  Endo/Heme/Allergies:  Negative for polydipsia. Does not bruise/bleed easily.  Psychiatric/Behavioral:  The patient is not nervous/anxious.     Vital Signs: Blood pressure (!) 146/73, pulse 70, temperature 98.5 F (36.9 C), temperature source Oral, resp. rate 16, height '5\' 10"'$  (1.778 m), weight 108 kg, SpO2 96 %.  Weight trends: Filed Weights   08/27/21 1415  Weight: 108 kg     Physical Exam: General: No acute distress  Head: Normocephalic, atraumatic. Moist oral mucosal membranes  Eyes: Anicteric  Neck: Supple  Lungs:  Clear to auscultation, normal effort  Heart: S1S2 no rubs  Abdomen:  Soft, nontender, bowel sounds present  Extremities: No peripheral edema.  Neurologic: Awake, alert, following commands  Skin: No acute rash       Lab results: Basic Metabolic Panel: Recent Labs  Lab 08/27/21 1243 08/28/21 0420  NA 137 136  K 4.5 4.2  CL 104 103  CO2 27 28  GLUCOSE 118* 102*  BUN 22* 17  CREATININE 1.61* 1.30*  CALCIUM 9.5 9.3    Liver Function Tests: Recent Labs  Lab 08/27/21 1243 08/28/21 0420  AST 14* 25  ALT 13 14  ALKPHOS 57 52  BILITOT 1.1 1.8*  PROT 7.8 7.0  ALBUMIN 4.5  3.7   No results for input(s): LIPASE, AMYLASE in the last 168 hours. No results for input(s): AMMONIA in the last 168 hours.  CBC: Recent Labs  Lab 08/27/21 1243 08/28/21 0420  WBC 5.5 7.9  NEUTROABS 3.9  --   HGB 17.7* 16.1  HCT 49.0 44.4  MCV 90.4 91.0  PLT 199 180    Cardiac Enzymes: No results for input(s): CKTOTAL, CKMB, CKMBINDEX, TROPONINI in the last 168 hours.  BNP: Invalid input(s): POCBNP  CBG: Recent Labs  Lab 08/27/21 1408  GLUCAP 98    Microbiology: Results for orders placed or performed during the hospital encounter of 08/27/21  Resp Panel by RT-PCR (Flu A&B, Covid) Nasopharyngeal Swab     Status: None   Collection Time: 08/27/21 12:43 PM   Specimen:  Nasopharyngeal Swab; Nasopharyngeal(NP) swabs in vial transport medium  Result Value Ref Range Status   SARS Coronavirus 2 by RT PCR NEGATIVE NEGATIVE Final    Comment: (NOTE) SARS-CoV-2 target nucleic acids are NOT DETECTED.  The SARS-CoV-2 RNA is generally detectable in upper respiratory specimens during the acute phase of infection. The lowest concentration of SARS-CoV-2 viral copies this assay can detect is 138 copies/mL. A negative result does not preclude SARS-Cov-2 infection and should not be used as the sole basis for treatment or other patient management decisions. A negative result may occur with  improper specimen collection/handling, submission of specimen other than nasopharyngeal swab, presence of viral mutation(s) within the areas targeted by this assay, and inadequate number of viral copies(<138 copies/mL). A negative result must be combined with clinical observations, patient history, and epidemiological information. The expected result is Negative.  Fact Sheet for Patients:  EntrepreneurPulse.com.au  Fact Sheet for Healthcare Providers:  IncredibleEmployment.be  This test is no t yet approved or cleared by the [place] FDA and  has been authorized for detection and/or diagnosis of SARS-CoV-2 by FDA under an Emergency Use Authorization (EUA). This EUA will remain  in effect (meaning this test can be used) for the duration of the COVID-19 declaration under Section 564(b)(1) of the Act, 21 U.S.C.section 360bbb-3(b)(1), unless the authorization is terminated  or revoked sooner.       Influenza A by PCR NEGATIVE NEGATIVE Final   Influenza B by PCR NEGATIVE NEGATIVE Final    Comment: (NOTE) The Xpert Xpress SARS-CoV-2/FLU/RSV plus assay is intended as an aid in the diagnosis of influenza from Nasopharyngeal swab specimens and should not be used as a sole basis for treatment. Nasal washings and aspirates are unacceptable for  Xpert Xpress SARS-CoV-2/FLU/RSV testing.  Fact Sheet for Patients: EntrepreneurPulse.com.au  Fact Sheet for Healthcare Providers: IncredibleEmployment.be  This test is not yet approved or cleared by the [place] FDA and has been authorized for detection and/or diagnosis of SARS-CoV-2 by FDA under an Emergency Use Authorization (EUA). This EUA will remain in effect (meaning this test can be used) for the duration of the COVID-19 declaration under Section 564(b)(1) of the Act, 21 U.S.C. section 360bbb-3(b)(1), unless the authorization is terminated or revoked.  Performed at [facility], [address] [place], [place] [address]     Coagulation Studies: Recent Labs    08/27/21 1243  LABPROT 13.7  INR 1.1    Urinalysis: No results for input(s): COLORURINE, LABSPEC, PHURINE, GLUCOSEU, HGBUR, BILIRUBINUR, KETONESUR, PROTEINUR, UROBILINOGEN, NITRITE, LEUKOCYTESUR in the last 72 hours.  Invalid input(s): APPERANCEUR    Imaging: CARDIAC CATHETERIZATION  Result Date: 08/27/2021   Mid LM to Dist LM lesion is  30% stenosed.   Mid LAD lesion is 99% stenosed.   Ost LAD lesion is 30% stenosed.   Prox RCA lesion is 30% stenosed.   Prox RCA to Mid RCA lesion is 40% stenosed.   RPDA lesion is 30% stenosed.   A drug-eluting stent was successfully placed using a STENT ONYX FRONTIER 3.0X22.   Post intervention, there is a 0% residual stenosis. 1.  Severe one-vessel coronary artery disease with 99% thrombotic stenosis in the mid LAD which is the culprit for anterior STEMI.  Mild to moderate left main and RCA disease. 2.  Left ventricular angiography was not performed due to chronic kidney disease.  LVEDP was significantly elevated at 32 mmHg. 3.  Successful angioplasty and drug-eluting stent placement to the mid LAD. Recommendations: Dual antiplatelet therapy for at least 12 months. Aggressive treatment of risk factors..  I switched atorvastatin to  high-dose rosuvastatin. I switched metoprolol to carvedilol.  An ACE inhibitor or ARB can be resumed once renal function is stable. I requested an echocardiogram. Gentle hydration for renal protection.   ECHOCARDIOGRAM COMPLETE  Result Date: 08/28/2021    ECHOCARDIOGRAM REPORT   Patient Name:   Chad Mccann Date of Exam: 08/28/2021 Medical Rec #:  [id number]        Height:       70.0 in Accession #:    [id number]       Weight:       238.1 lb Date of Birth:  31-Dec-1965        BSA:          2.248 m Patient Age:    4 years         BP:           148/80 mmHg Patient Gender: M                HR:           61 bpm. Exam Location:  [facility] Procedure: 2D Echo, Color Doppler and Cardiac Doppler Indications:     I21.9 Acute myocardial infarction, unspecified  History:         Patient has no prior history of Echocardiogram examinations.                  Risk Factors:Hypertension and Sleep Apnea.  Sonographer:     Charmayne Sheer Referring Phys:  [id number] A ARIDA Diagnosing Phys: Nelva Bush MD  Sonographer Comments: Suboptimal parasternal window. IMPRESSIONS  1. Left ventricular ejection fraction, by estimation, is 55 to 60%. The left ventricle has normal function. The left ventricle has no regional wall motion abnormalities. Left ventricular diastolic parameters are consistent with Grade II diastolic dysfunction (pseudonormalization).  2. Right ventricular systolic function is normal. The right ventricular size is normal.  3. The mitral valve is normal in structure. Trivial mitral valve regurgitation. No evidence of mitral stenosis.  4. The aortic valve is tricuspid. Aortic valve regurgitation is not visualized. No aortic stenosis is present.  5. The inferior vena cava is normal in size with greater than 50% respiratory variability, suggesting right atrial pressure of 3 mmHg. FINDINGS  Left Ventricle: Left ventricular ejection fraction, by estimation, is 55 to 60%. The left ventricle has normal function. The left  ventricle has no regional wall motion abnormalities. The left ventricular internal cavity size was normal in size. There is  no left ventricular hypertrophy. Left ventricular diastolic parameters are consistent with Grade II diastolic dysfunction (pseudonormalization). Right Ventricle: The right ventricular size is  normal. No increase in right ventricular wall thickness. Right ventricular systolic function is normal. Left Atrium: Left atrial size was normal in size. Right Atrium: Right atrial size was normal in size. Pericardium: There is no evidence of pericardial effusion. Mitral Valve: The mitral valve is normal in structure. Trivial mitral valve regurgitation. No evidence of mitral valve stenosis. MV peak gradient, 3.3 mmHg. The mean mitral valve gradient is 1.0 mmHg. Tricuspid Valve: The tricuspid valve is grossly normal. Tricuspid valve regurgitation is trivial. Aortic Valve: The aortic valve is tricuspid. Aortic valve regurgitation is not visualized. No aortic stenosis is present. Aortic valve mean gradient measures 7.0 mmHg. Aortic valve peak gradient measures 12.4 mmHg. Aortic valve area, by VTI measures 2.26  cm. Pulmonic Valve: The pulmonic valve was grossly normal. Pulmonic valve regurgitation is trivial. No evidence of pulmonic stenosis. Aorta: The aortic root is normal in size and structure. Pulmonary Artery: The pulmonary artery is not well seen. Venous: The inferior vena cava is normal in size with greater than 50% respiratory variability, suggesting right atrial pressure of 3 mmHg. IAS/Shunts: No atrial level shunt detected by color flow Doppler.  LEFT VENTRICLE PLAX 2D LVIDd:         4.50 cm      Diastology LVIDs:         3.10 cm      LV e' medial:    5.33 cm/s LV PW:         1.00 cm      LV E/e' medial:  13.5 LV IVS:        1.00 cm      LV e' lateral:   6.64 cm/s LVOT diam:     2.00 cm      LV E/e' lateral: 10.8 LV SV:         77 LV SV Index:   34 LVOT Area:     3.14 cm  LV Volumes (MOD) LV vol  d, MOD A2C: 106.0 ml LV vol d, MOD A4C: 113.0 ml LV vol s, MOD A2C: 49.6 ml LV vol s, MOD A4C: 52.8 ml LV SV MOD A2C:     56.4 ml LV SV MOD A4C:     113.0 ml LV SV MOD BP:      58.1 ml RIGHT VENTRICLE RV Basal diam:  3.50 cm RV Mid diam:    4.60 cm TAPSE (M-mode): 2.9 cm LEFT ATRIUM             Index       RIGHT ATRIUM           Index LA diam:        4.20 cm 1.87 cm/m  RA Area:     15.40 cm LA Vol (A2C):   49.6 ml 22.07 ml/m RA Volume:   42.70 ml  19.00 ml/m LA Vol (A4C):   32.7 ml 14.55 ml/m LA Biplane Vol: 43.8 ml 19.49 ml/m  AORTIC VALVE                    PULMONIC VALVE AV Area (Vmax):    2.07 cm     PV Vmax:       1.20 m/s AV Area (Vmean):   1.98 cm     PV Vmean:      86.700 cm/s AV Area (VTI):     2.26 cm     PV VTI:        0.238 m AV Vmax:  176.00 cm/s  PV Peak grad:  5.8 mmHg AV Vmean:          129.000 cm/s PV Mean grad:  3.0 mmHg AV VTI:            0.340 m AV Peak Grad:      12.4 mmHg AV Mean Grad:      7.0 mmHg LVOT Vmax:         116.00 cm/s LVOT Vmean:        81.400 cm/s LVOT VTI:          0.245 m LVOT/AV VTI ratio: 0.72  AORTA Ao Root diam: 3.10 cm MITRAL VALVE MV Area (PHT): 2.55 cm    SHUNTS MV Area VTI:   2.88 cm    Systemic VTI:  0.24 m MV Peak grad:  3.3 mmHg    Systemic Diam: 2.00 cm MV Mean grad:  1.0 mmHg MV Vmax:       0.90 m/s MV Vmean:      56.6 cm/s MV Decel Time: 297 msec MV E velocity: 72.00 cm/s MV A velocity: 67.70 cm/s MV E/A ratio:  1.06 Christopher End MD Electronically signed by Nelva Bush MD Signature Date/Time: 08/28/2021/1:42:50 PM    Final      Assessment & Plan: Pt is a 55 y.o. 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   1.  ESRD status post renal transplantation status. .  Baseline creatinine appears to be 1.27.  Creatinine upon admission was 1.6.   Creatinine now down to 1.3. His home dose of tacrolimus was 3 mg in the a.m. and 2 mg in the p.m.  We will increase the a.m. dose to 30 mg.  2.  ST elevation myocardial infarction.  Troponins were trending up most recently.  Most recent troponin was 395.  Patient status post PCI to the LAD.  He has been started on ticagrelor.  3.  Hypertension.  Continue coreg 6.'25mg'$ .  Consider adding ARB as outpt.

## 2021-08-28 NOTE — Plan of Care (Signed)
Pt rested well all night, denies any CP through the night. BP was treated x1 . Pt voiding and taking fluid well.  Up in chair  for  and tolerated activities well.

## 2021-08-28 NOTE — Progress Notes (Signed)
*  PRELIMINARY RESULTS* Echocardiogram 2D Echocardiogram has been performed.  [place] 08/28/2021, 8:37 AM

## 2021-08-29 MED ORDER — NITROGLYCERIN 0.4 MG SL SUBL: 0.4000 mg | SUBLINGUAL_TABLET | SUBLINGUAL | 12 refills | Status: AC | PRN

## 2021-08-29 MED ORDER — PRASUGREL HCL 10 MG PO TABS: 10.0000 mg | ORAL_TABLET | Freq: Every day | ORAL | 10 refills | Status: DC

## 2021-08-29 MED ORDER — CARVEDILOL 6.25 MG PO TABS: 6.2500 mg | ORAL_TABLET | Freq: Two times a day (BID) | ORAL | 3 refills | Status: DC

## 2021-08-29 MED ORDER — TICAGRELOR 90 MG PO TABS: 90.0000 mg | ORAL_TABLET | Freq: Two times a day (BID) | ORAL | 3 refills | Status: DC

## 2021-08-29 MED ORDER — PRASUGREL HCL 10 MG PO TABS
60.0000 mg | ORAL_TABLET | Freq: Once | ORAL | Status: AC
  Administered 2021-08-29: 60 mg via ORAL
  Filled 2021-08-29: qty 6

## 2021-08-29 MED ORDER — ROSUVASTATIN CALCIUM 40 MG PO TABS: 40.0000 mg | ORAL_TABLET | Freq: Every day | ORAL | 3 refills | Status: DC

## 2021-08-29 MED ORDER — ASPIRIN 81 MG PO CHEW: 81.0000 mg | CHEWABLE_TABLET | Freq: Every day | ORAL | 11 refills | Status: AC

## 2021-08-29 MED ORDER — PRASUGREL HCL 10 MG PO TABS: 10.0000 mg | ORAL_TABLET | Freq: Every day | ORAL | Status: DC

## 2021-08-29 NOTE — TOC Transition Note (Signed)
Transition of Care [facility]) - CM/SW Discharge Note   Patient Details  Name: Chad Mccann MRN: [id number] Date of Birth: Feb 03, 1966  [facility] CM/SW Contact:  Kerin Salen, RN Phone Number: 08/29/2021, 2:07 PM   Clinical Narrative: Patient to discharge home today, given Brilinta coupon per Attending request, patient appreciative.      Final next level of care: Home/Self Care Barriers to Discharge: Barriers Resolved   Patient Goals and CMS Choice     Choice offered to / list presented to : NA  Discharge Placement                       Discharge Plan and Services                DME Arranged: N/A DME Agency: NA       HH Arranged: NA HH Agency: NA        Social Determinants of Health (SDOH) Interventions     Readmission Risk Interventions No flowsheet data found.

## 2021-08-29 NOTE — Discharge Instructions (Signed)
Patient to follow-up with [facility] nephrology on his scheduled appointment

## 2021-08-29 NOTE — Discharge Summary (Addendum)
[facility] NAME: Chad Mccann    MR#:  [id number]  DATE OF BIRTH:  [birth year]  DATE OF ADMISSION:  08/27/2021 ADMITTING PHYSICIAN: Orene Desanctis, DO  DATE OF DISCHARGE: 08/29/2021  PRIMARY CARE PHYSICIAN: [name], MD    ADMISSION DIAGNOSIS:  ST elevation myocardial infarction (STEMI), unspecified artery (HCC) [I21.3] Acute ST elevation myocardial infarction (STEMI) of anterior wall (HCC) [I21.09] STEMI involving left anterior descending coronary artery ([place]) [I21.02]  DISCHARGE DIAGNOSIS:  STEMI,Acute anterior wall   SECONDARY DIAGNOSIS:   Past Medical History:  Diagnosis Date  . Benign prostatic hypertrophy   . Bladder spasm   . Erectile dysfunction   . Frequency   . GERD (gastroesophageal reflux disease)   . Hypertension   . Hypogonadism in male   . Kidney 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   . Kidney, malrotation   . MGUS (monoclonal gammopathy of unknown significance)   . Monoclonal paraproteinemia   . Obesity, unspecified   . Other specified congenital anomaly of kidney   . Other specified disorders of bladder   . Other testicular hypofunction   . Premature ejaculation   . Sleep apnea   . Ventral hernia     HOSPITAL COURSE:   ORDEAN MURLEY is a 55 y.o. male with medical history significant for history of FSGS s/p kidney transplant, hypertension, hyperlipidemia, OSA, GERD, BPH who presents with concerns of chest pain.  Patient woke up this morning feeling like he needed to burp and then around 11 AM began to feel more crushing pressure-like chest pain with radiation down the left arm not relieved with rest . W/u showed STEMI   Acute anterior wall STEMI s/p DES to LAD  -continue aspirin and brilinta  -cont statins, BB (coreg) and ARB -s/p Cath  1.  Severe one-vessel coronary artery disease with 99% thrombotic stenosis in the mid LAD which is the culprit for anterior STEMI.  Mild to moderate left main and RCA  disease. 2.  Left ventricular angiography was not performed due to chronic kidney disease.  LVEDP was significantly elevated at 32 mmHg. 3.  Successful angioplasty and drug-eluting stent placement to the mid LAD.   AKI with hx of FSGS s/p kidney transplant  (h/o ESRD on the past) - Continue tacrolimus and mycophenolate -nephrology has been consulted by cardiology -Avoid nephrotoxic agent   HTN -Coreg per cardiology -resume telmisartan   HLD LDL of 82 with goal of 75 -continue high-intensity statin    OSA -continue CPAP   Addendum:: Pt's insurance does not cover Brilinta. Cardiology recommends Prasugrel 10 mg qd--Rx sent to pharmacy to verify coverage. Pt will get loading dose today before d/c     Procedures: cath Family communication : none today Consults : cardiology, nephrology CODE STATUS: full DVT Prophylaxis : enoxaparin Level of care: Stepdown Status is: Inpatient     Dispo: The patient is from: Home              Anticipated d/c is to: Home today              Patient currently is medically stable to d/c.              Patient overall improving. Okay from cardiology standpoint for discharge. Discussed with Dr. Garen Lah CONSULTS OBTAINED:  Treatment Team:  Wellington Hampshire, MD  DRUG ALLERGIES:   Allergies  Allergen Reactions  . Hydralazine Other (See Comments)    Nose bleeds/light headed  .  Morphine And Related Other (See Comments)    "difficulty waking patient"  . Prednisone     hallucinations  . Shellfish Allergy Nausea And Vomiting    DISCHARGE MEDICATIONS:   Allergies as of 08/29/2021       Reactions   Hydralazine Other (See Comments)   Nose bleeds/light headed   Morphine And Related Other (See Comments)   "difficulty waking patient"   Prednisone    hallucinations   Shellfish Allergy Nausea And Vomiting        Medication List     STOP taking these medications    atorvastatin 10 MG tablet Commonly known as: LIPITOR   chlorthalidone  25 MG tablet Commonly known as: HYGROTON   metoprolol tartrate 100 MG tablet Commonly known as: LOPRESSOR       TAKE these medications    acetaminophen 325 MG tablet Commonly known as: TYLENOL Take 1-2 tablets (325-650 mg total) by mouth every 6 (six) hours as needed for mild pain (pain score 1-3 or temp > 100.5).   aspirin 81 MG chewable tablet Chew 1 tablet (81 mg total) by mouth daily. Start taking on: August 30, 2021   carvedilol 6.25 MG tablet Commonly known as: COREG Take 1 tablet (6.25 mg total) by mouth 2 (two) times daily with a meal.   clonazePAM 0.5 MG tablet Commonly known as: KLONOPIN Take 0.5 mg by mouth at bedtime.   lidocaine-prilocaine cream Commonly known as: EMLA Apply 1 application topically as needed.   mycophenolate 250 MG capsule Commonly known as: CELLCEPT Take 750 mg by mouth.   nitroGLYCERIN 0.4 MG SL tablet Commonly known as: NITROSTAT Place 1 tablet (0.4 mg total) under the tongue every 5 (five) minutes as needed for chest pain.   prasugrel 10 MG Tabs tablet Commonly known as: EFFIENT Take 1 tablet (10 mg total) by mouth daily. Start taking on: August 30, 2021   rosuvastatin 40 MG tablet Commonly known as: CRESTOR Take 1 tablet (40 mg total) by mouth daily. Start taking on: August 30, 2021   tacrolimus 1 MG capsule Commonly known as: PROGRAF Take 2 mg by mouth 2 (two) times daily.   tadalafil 20 MG tablet Commonly known as: CIALIS Take 1 tablet (20 mg total) by mouth daily as needed for erectile dysfunction.   telmisartan 20 MG tablet Commonly known as: MICARDIS   testosterone cypionate 200 MG/ML injection Commonly known as: DEPOTESTOSTERONE CYPIONATE Inject 0.75  mL into the muscle every 14 days        If you experience worsening of your admission symptoms, develop shortness of breath, life threatening emergency, suicidal or homicidal thoughts you must seek medical attention immediately by calling 911 or calling  your MD immediately  if symptoms less severe.  You Must read complete instructions/literature along with all the possible adverse reactions/side effects for all the Medicines you take and that have been prescribed to you. Take any new Medicines after you have completely understood and accept all the possible adverse reactions/side effects.   Please note  You were cared for by a hospitalist during your hospital stay. If you have any questions about your discharge medications or the care you received while you were in the hospital after you are discharged, you can call the unit and asked to speak with the hospitalist on call if the hospitalist that took care of you is not available. Once you are discharged, your primary care physician will handle any further medical issues. Please note that NO REFILLS for  any discharge medications will be authorized once you are discharged, as it is imperative that you return to your primary care physician (or establish a relationship with a primary care physician if you do not have one) for your aftercare needs so that they can reassess your need for medications and monitor your lab values. Today   SUBJECTIVE  No new complaints Ambulated in CCU room   VITAL SIGNS:  Blood pressure (!) 130/92, pulse 69, temperature 99.4 F (37.4 C), temperature source Oral, resp. rate 17, height '5\' 10"'$  (1.778 m), weight 108 kg, SpO2 97 %.  I/O:   Intake/Output Summary (Last 24 hours) at 08/29/2021 1452 Last data filed at 08/29/2021 0900 Gross per 24 hour  Intake 480 ml  Output 1875 ml  Net -1395 ml    PHYSICAL EXAMINATION:  GENERAL:  55 y.o.-year-old patient lying in the bed with no acute distress.  LUNGS: Normal breath sounds bilaterally, no wheezing, rales,rhonchi or crepitation. No use of accessory muscles of respiration.  CARDIOVASCULAR: S1, S2 normal. No murmurs, rubs, or gallops.  ABDOMEN: Soft, non-tender, non-distended. Bowel sounds present. No organomegaly or mass.   EXTREMITIES: No pedal edema, cyanosis, or clubbing.  NEUROLOGIC: non focal PSYCHIATRIC: The patient is alert and oriented x 3.  SKIN: No obvious rash, lesion, or ulcer.   DATA REVIEW:   CBC  Recent Labs  Lab 08/28/21 0420  WBC 7.9  HGB 16.1  HCT 44.4  PLT 180    Chemistries  Recent Labs  Lab 08/28/21 0420  NA 136  K 4.2  CL 103  CO2 28  GLUCOSE 102*  BUN 17  CREATININE 1.30*  CALCIUM 9.3  AST 25  ALT 14  ALKPHOS 52  BILITOT 1.8*    Microbiology Results   Recent Results (from the past 240 hour(s))  Resp Panel by RT-PCR (Flu A&B, Covid) Nasopharyngeal Swab     Status: None   Collection Time: 08/27/21 12:43 PM   Specimen: Nasopharyngeal Swab; Nasopharyngeal(NP) swabs in vial transport medium  Result Value Ref Range Status   SARS Coronavirus 2 by RT PCR NEGATIVE NEGATIVE Final    Comment: (NOTE) SARS-CoV-2 target nucleic acids are NOT DETECTED.  The SARS-CoV-2 RNA is generally detectable in upper respiratory specimens during the acute phase of infection. The lowest concentration of SARS-CoV-2 viral copies this assay can detect is 138 copies/mL. A negative result does not preclude SARS-Cov-2 infection and should not be used as the sole basis for treatment or other patient management decisions. A negative result may occur with  improper specimen collection/handling, submission of specimen other than nasopharyngeal swab, presence of viral mutation(s) within the areas targeted by this assay, and inadequate number of viral copies(<138 copies/mL). A negative result must be combined with clinical observations, patient history, and epidemiological information. The expected result is Negative.  Fact Sheet for Patients:  EntrepreneurPulse.com.au  Fact Sheet for Healthcare Providers:  IncredibleEmployment.be  This test is no t yet approved or cleared by the [place] FDA and  has been authorized for detection and/or diagnosis  of SARS-CoV-2 by FDA under an Emergency Use Authorization (EUA). This EUA will remain  in effect (meaning this test can be used) for the duration of the COVID-19 declaration under Section 564(b)(1) of the Act, 21 U.S.C.section 360bbb-3(b)(1), unless the authorization is terminated  or revoked sooner.       Influenza A by PCR NEGATIVE NEGATIVE Final   Influenza B by PCR NEGATIVE NEGATIVE Final    Comment: (NOTE) The Xpert Xpress  SARS-CoV-2/FLU/RSV plus assay is intended as an aid in the diagnosis of influenza from Nasopharyngeal swab specimens and should not be used as a sole basis for treatment. Nasal washings and aspirates are unacceptable for Xpert Xpress SARS-CoV-2/FLU/RSV testing.  Fact Sheet for Patients: EntrepreneurPulse.com.au  Fact Sheet for Healthcare Providers: IncredibleEmployment.be  This test is not yet approved or cleared by the [place] FDA and has been authorized for detection and/or diagnosis of SARS-CoV-2 by FDA under an Emergency Use Authorization (EUA). This EUA will remain in effect (meaning this test can be used) for the duration of the COVID-19 declaration under Section 564(b)(1) of the Act, 21 U.S.C. section 360bbb-3(b)(1), unless the authorization is terminated or revoked.  Performed at [facility], [address] [place], [place] [address]     RADIOLOGY:  ECHOCARDIOGRAM COMPLETE  Result Date: 08/28/2021    ECHOCARDIOGRAM REPORT   Patient Name:   SADAT YARTER Date of Exam: 08/28/2021 Medical Rec #:  [id number]        Height:       70.0 in Accession #:    [id number]       Weight:       238.1 lb Date of Birth:  Mar 19, 1966        BSA:          2.248 m Patient Age:    55 years         BP:           148/80 mmHg Patient Gender: M                HR:           61 bpm. Exam Location:  [facility] Procedure: 2D Echo, Color Doppler and Cardiac Doppler Indications:     I21.9 Acute myocardial infarction, unspecified   History:         Patient has no prior history of Echocardiogram examinations.                  Risk Factors:Hypertension and Sleep Apnea.  Sonographer:     Charmayne Sheer Referring Phys:  [id number] ARIDA Diagnosing Phys: Nelva Bush MD  Sonographer Comments: Suboptimal parasternal window. IMPRESSIONS  1. Left ventricular ejection fraction, by estimation, is 55 to 60%. The left ventricle has normal function. The left ventricle has no regional wall motion abnormalities. Left ventricular diastolic parameters are consistent with Grade II diastolic dysfunction (pseudonormalization).  2. Right ventricular systolic function is normal. The right ventricular size is normal.  3. The mitral valve is normal in structure. Trivial mitral valve regurgitation. No evidence of mitral stenosis.  4. The aortic valve is tricuspid. Aortic valve regurgitation is not visualized. No aortic stenosis is present.  5. The inferior vena cava is normal in size with greater than 50% respiratory variability, suggesting right atrial pressure of 3 mmHg. FINDINGS  Left Ventricle: Left ventricular ejection fraction, by estimation, is 55 to 60%. The left ventricle has normal function. The left ventricle has no regional wall motion abnormalities. The left ventricular internal cavity size was normal in size. There is  no left ventricular hypertrophy. Left ventricular diastolic parameters are consistent with Grade II diastolic dysfunction (pseudonormalization). Right Ventricle: The right ventricular size is normal. No increase in right ventricular wall thickness. Right ventricular systolic function is normal. Left Atrium: Left atrial size was normal in size. Right Atrium: Right atrial size was normal in size. Pericardium: There is no evidence of pericardial effusion. Mitral Valve: The mitral valve  is normal in structure. Trivial mitral valve regurgitation. No evidence of mitral valve stenosis. MV peak gradient, 3.3 mmHg. The mean mitral valve  gradient is 1.0 mmHg. Tricuspid Valve: The tricuspid valve is grossly normal. Tricuspid valve regurgitation is trivial. Aortic Valve: The aortic valve is tricuspid. Aortic valve regurgitation is not visualized. No aortic stenosis is present. Aortic valve mean gradient measures 7.0 mmHg. Aortic valve peak gradient measures 12.4 mmHg. Aortic valve area, by VTI measures 2.26  cm. Pulmonic Valve: The pulmonic valve was grossly normal. Pulmonic valve regurgitation is trivial. No evidence of pulmonic stenosis. Aorta: The aortic root is normal in size and structure. Pulmonary Artery: The pulmonary artery is not well seen. Venous: The inferior vena cava is normal in size with greater than 50% respiratory variability, suggesting right atrial pressure of 3 mmHg. IAS/Shunts: No atrial level shunt detected by color flow Doppler.  LEFT VENTRICLE PLAX 2D LVIDd:         4.50 cm      Diastology LVIDs:         3.10 cm      LV e' medial:    5.33 cm/s LV PW:         1.00 cm      LV E/e' medial:  13.5 LV IVS:        1.00 cm      LV e' lateral:   6.64 cm/s LVOT diam:     2.00 cm      LV E/e' lateral: 10.8 LV SV:         77 LV SV Index:   34 LVOT Area:     3.14 cm  LV Volumes (MOD) LV vol d, MOD A2C: 106.0 ml LV vol d, MOD A4C: 113.0 ml LV vol s, MOD A2C: 49.6 ml LV vol s, MOD A4C: 52.8 ml LV SV MOD A2C:     56.4 ml LV SV MOD A4C:     113.0 ml LV SV MOD BP:      58.1 ml RIGHT VENTRICLE RV Basal diam:  3.50 cm RV Mid diam:    4.60 cm TAPSE (M-mode): 2.9 cm LEFT ATRIUM             Index       RIGHT ATRIUM           Index LA diam:        4.20 cm 1.87 cm/m  RA Area:     15.40 cm LA Vol (A2C):   49.6 ml 22.07 ml/m RA Volume:   42.70 ml  19.00 ml/m LA Vol (A4C):   32.7 ml 14.55 ml/m LA Biplane Vol: 43.8 ml 19.49 ml/m  AORTIC VALVE                    PULMONIC VALVE AV Area (Vmax):    2.07 cm     PV Vmax:       1.20 m/s AV Area (Vmean):   1.98 cm     PV Vmean:      86.700 cm/s AV Area (VTI):     2.26 cm     PV VTI:        0.238 m AV  Vmax:           176.00 cm/s  PV Peak grad:  5.8 mmHg AV Vmean:          129.000 cm/s PV Mean grad:  3.0 mmHg AV VTI:  0.340 m AV Peak Grad:      12.4 mmHg AV Mean Grad:      7.0 mmHg LVOT Vmax:         116.00 cm/s LVOT Vmean:        81.400 cm/s LVOT VTI:          0.245 m LVOT/AV VTI ratio: 0.72  AORTA Ao Root diam: 3.10 cm MITRAL VALVE MV Area (PHT): 2.55 cm    SHUNTS MV Area VTI:   2.88 cm    Systemic VTI:  0.24 m MV Peak grad:  3.3 mmHg    Systemic Diam: 2.00 cm MV Mean grad:  1.0 mmHg MV Vmax:       0.90 m/s MV Vmean:      56.6 cm/s MV Decel Time: 297 msec MV E velocity: 72.00 cm/s MV A velocity: 67.70 cm/s MV E/A ratio:  1.06 Harrell Gave End MD Electronically signed by Nelva Bush MD Signature Date/Time: 08/28/2021/1:42:50 PM    Final      CODE STATUS:     Code Status Orders  (From admission, onward)           Start     Ordered   08/27/21 1532  Full code  Continuous        08/27/21 1532           Code Status History     Date Active Date Inactive Code Status Order ID Comments User Context   03/02/2019 1455 03/05/2019 1530 Full Code [id number]  Hessie Knows, MD Inpatient   02/11/2016 0207 02/12/2016 2142 Full Code [id number]  Saundra Shelling, MD Inpatient        TOTAL TIME TAKING CARE OF THIS PATIENT: 40 minutes.    Fritzi Mandes M.D  [facility]    CC: Primary care physician; [name], MD

## 2021-08-29 NOTE — Progress Notes (Signed)
[facility]  ROUNDING NOTE   Subjective:  Late entry. Patient seen earlier in the day. No further chest pain reported. Creatinine currently 1.3.   Objective:  Vital signs in last 24 hours:  Temp:  [98.2 F (36.8 C)-100.1 F (37.8 C)] 99.4 F (37.4 C) (09/07 1100) Pulse Rate:  [40-69] 69 (09/07 0832) Resp:  [16-17] 17 (09/07 1100) BP: (130-159)/(81-98) 130/92 (09/07 1100) SpO2:  [95 %-100 %] 97 % (09/07 0300)  Weight change:  Filed Weights   08/27/21 1415  Weight: 108 kg    Intake/Output: I/O last 3 completed shifts: In: 1331.8 [P.O.:1200; I.V.:131.8] Out: 2975 [Urine:2975]   Intake/Output this shift:  Total I/O In: 240 [P.O.:240] Out: 600 [Urine:600]  Physical Exam: General: No acute distress  Head: Normocephalic, atraumatic. Moist oral mucosal membranes  Eyes: Anicteric  Neck: Supple  Lungs:  Clear to auscultation, normal effort  Heart: S1S2 no rubs  Abdomen:  Soft, nontender, bowel sounds present  Extremities: No peripheral edema.  Neurologic: Awake, alert, following commands  Skin: No acute rash       Basic Metabolic Panel: Recent Labs  Lab 08/27/21 1243 08/28/21 0420  NA 137 136  K 4.5 4.2  CL 104 103  CO2 27 28  GLUCOSE 118* 102*  BUN 22* 17  CREATININE 1.61* 1.30*  CALCIUM 9.5 9.3    Liver Function Tests: Recent Labs  Lab 08/27/21 1243 08/28/21 0420  AST 14* 25  ALT 13 14  ALKPHOS 57 52  BILITOT 1.1 1.8*  PROT 7.8 7.0  ALBUMIN 4.5 3.7   No results for input(s): LIPASE, AMYLASE in the last 168 hours. No results for input(s): AMMONIA in the last 168 hours.  CBC: Recent Labs  Lab 08/27/21 1243 08/28/21 0420  WBC 5.5 7.9  NEUTROABS 3.9  --   HGB 17.7* 16.1  HCT 49.0 44.4  MCV 90.4 91.0  PLT 199 180    Cardiac Enzymes: No results for input(s): CKTOTAL, CKMB, CKMBINDEX, TROPONINI in the last 168 hours.  BNP: Invalid input(s): POCBNP  CBG: Recent Labs  Lab 08/27/21 1408  GLUCAP 98     Microbiology: Results for orders placed or performed during the hospital encounter of 08/27/21  Resp Panel by RT-PCR (Flu A&B, Covid) Nasopharyngeal Swab     Status: None   Collection Time: 08/27/21 12:43 PM   Specimen: Nasopharyngeal Swab; Nasopharyngeal(NP) swabs in vial transport medium  Result Value Ref Range Status   SARS Coronavirus 2 by RT PCR NEGATIVE NEGATIVE Final    Comment: (NOTE) SARS-CoV-2 target nucleic acids are NOT DETECTED.  The SARS-CoV-2 RNA is generally detectable in upper respiratory specimens during the acute phase of infection. The lowest concentration of SARS-CoV-2 viral copies this assay can detect is 138 copies/mL. A negative result does not preclude SARS-Cov-2 infection and should not be used as the sole basis for treatment or other patient management decisions. A negative result may occur with  improper specimen collection/handling, submission of specimen other than nasopharyngeal swab, presence of viral mutation(s) within the areas targeted by this assay, and inadequate number of viral copies(<138 copies/mL). A negative result must be combined with clinical observations, patient history, and epidemiological information. The expected result is Negative.  Fact Sheet for Patients:  EntrepreneurPulse.com.au  Fact Sheet for Healthcare Providers:  IncredibleEmployment.be  This test is no t yet approved or cleared by the [place] FDA and  has been authorized for detection and/or diagnosis of SARS-CoV-2 by FDA under an Emergency Use Authorization (EUA). This  EUA will remain  in effect (meaning this test can be used) for the duration of the COVID-19 declaration under Section 564(b)(1) of the Act, 21 U.S.C.section 360bbb-3(b)(1), unless the authorization is terminated  or revoked sooner.       Influenza A by PCR NEGATIVE NEGATIVE Final   Influenza B by PCR NEGATIVE NEGATIVE Final    Comment: (NOTE) The Xpert  Xpress SARS-CoV-2/FLU/RSV plus assay is intended as an aid in the diagnosis of influenza from Nasopharyngeal swab specimens and should not be used as a sole basis for treatment. Nasal washings and aspirates are unacceptable for Xpert Xpress SARS-CoV-2/FLU/RSV testing.  Fact Sheet for Patients: EntrepreneurPulse.com.au  Fact Sheet for Healthcare Providers: IncredibleEmployment.be  This test is not yet approved or cleared by the [place] FDA and has been authorized for detection and/or diagnosis of SARS-CoV-2 by FDA under an Emergency Use Authorization (EUA). This EUA will remain in effect (meaning this test can be used) for the duration of the COVID-19 declaration under Section 564(b)(1) of the Act, 21 U.S.C. section 360bbb-3(b)(1), unless the authorization is terminated or revoked.  Performed at [facility] [place], [place] [address]     Coagulation Studies: Recent Labs    08/27/21 1243  LABPROT 13.7  INR 1.1    Urinalysis: No results for input(s): COLORURINE, LABSPEC, PHURINE, GLUCOSEU, HGBUR, BILIRUBINUR, KETONESUR, PROTEINUR, UROBILINOGEN, NITRITE, LEUKOCYTESUR in the last 72 hours.  Invalid input(s): APPERANCEUR    Imaging: ECHOCARDIOGRAM COMPLETE  Result Date: 08/28/2021    ECHOCARDIOGRAM REPORT   Patient Name:   Chad Mccann Date of Exam: 08/28/2021 Medical Rec #:  [id number]        Height:       70.0 in Accession #:    [id number]       Weight:       238.1 lb Date of Birth:  1966/10/19        BSA:          2.248 m Patient Age:    55 years         BP:           148/80 mmHg Patient Gender: M                HR:           61 bpm. Exam Location:  [facility] Procedure: 2D Echo, Color Doppler and Cardiac Doppler Indications:     I21.9 Acute myocardial infarction, unspecified  History:         Patient has no prior history of Echocardiogram examinations.                  Risk Factors:Hypertension and Sleep Apnea.   Sonographer:     Charmayne Sheer Referring Phys:  [id number] A ARIDA Diagnosing Phys: Nelva Bush MD  Sonographer Comments: Suboptimal parasternal window. IMPRESSIONS  1. Left ventricular ejection fraction, by estimation, is 55 to 60%. The left ventricle has normal function. The left ventricle has no regional wall motion abnormalities. Left ventricular diastolic parameters are consistent with Grade II diastolic dysfunction (pseudonormalization).  2. Right ventricular systolic function is normal. The right ventricular size is normal.  3. The mitral valve is normal in structure. Trivial mitral valve regurgitation. No evidence of mitral stenosis.  4. The aortic valve is tricuspid. Aortic valve regurgitation is not visualized. No aortic stenosis is present.  5. The inferior vena cava is normal in size with greater than 50% respiratory variability, suggesting right atrial pressure  of 3 mmHg. FINDINGS  Left Ventricle: Left ventricular ejection fraction, by estimation, is 55 to 60%. The left ventricle has normal function. The left ventricle has no regional wall motion abnormalities. The left ventricular internal cavity size was normal in size. There is  no left ventricular hypertrophy. Left ventricular diastolic parameters are consistent with Grade II diastolic dysfunction (pseudonormalization). Right Ventricle: The right ventricular size is normal. No increase in right ventricular wall thickness. Right ventricular systolic function is normal. Left Atrium: Left atrial size was normal in size. Right Atrium: Right atrial size was normal in size. Pericardium: There is no evidence of pericardial effusion. Mitral Valve: The mitral valve is normal in structure. Trivial mitral valve regurgitation. No evidence of mitral valve stenosis. MV peak gradient, 3.3 mmHg. The mean mitral valve gradient is 1.0 mmHg. Tricuspid Valve: The tricuspid valve is grossly normal. Tricuspid valve regurgitation is trivial. Aortic Valve: The aortic  valve is tricuspid. Aortic valve regurgitation is not visualized. No aortic stenosis is present. Aortic valve mean gradient measures 7.0 mmHg. Aortic valve peak gradient measures 12.4 mmHg. Aortic valve area, by VTI measures 2.26  cm. Pulmonic Valve: The pulmonic valve was grossly normal. Pulmonic valve regurgitation is trivial. No evidence of pulmonic stenosis. Aorta: The aortic root is normal in size and structure. Pulmonary Artery: The pulmonary artery is not well seen. Venous: The inferior vena cava is normal in size with greater than 50% respiratory variability, suggesting right atrial pressure of 3 mmHg. IAS/Shunts: No atrial level shunt detected by color flow Doppler.  LEFT VENTRICLE PLAX 2D LVIDd:         4.50 cm      Diastology LVIDs:         3.10 cm      LV e' medial:    5.33 cm/s LV PW:         1.00 cm      LV E/e' medial:  13.5 LV IVS:        1.00 cm      LV e' lateral:   6.64 cm/s LVOT diam:     2.00 cm      LV E/e' lateral: 10.8 LV SV:         77 LV SV Index:   34 LVOT Area:     3.14 cm  LV Volumes (MOD) LV vol d, MOD A2C: 106.0 ml LV vol d, MOD A4C: 113.0 ml LV vol s, MOD A2C: 49.6 ml LV vol s, MOD A4C: 52.8 ml LV SV MOD A2C:     56.4 ml LV SV MOD A4C:     113.0 ml LV SV MOD BP:      58.1 ml RIGHT VENTRICLE RV Basal diam:  3.50 cm RV Mid diam:    4.60 cm TAPSE (M-mode): 2.9 cm LEFT ATRIUM             Index       RIGHT ATRIUM           Index LA diam:        4.20 cm 1.87 cm/m  RA Area:     15.40 cm LA Vol (A2C):   49.6 ml 22.07 ml/m RA Volume:   42.70 ml  19.00 ml/m LA Vol (A4C):   32.7 ml 14.55 ml/m LA Biplane Vol: 43.8 ml 19.49 ml/m  AORTIC VALVE                    PULMONIC VALVE AV Area (Vmax):  2.07 cm     PV Vmax:       1.20 m/s AV Area (Vmean):   1.98 cm     PV Vmean:      86.700 cm/s AV Area (VTI):     2.26 cm     PV VTI:        0.238 m AV Vmax:           176.00 cm/s  PV Peak grad:  5.8 mmHg AV Vmean:          129.000 cm/s PV Mean grad:  3.0 mmHg AV VTI:            0.340 m AV Peak  Grad:      12.4 mmHg AV Mean Grad:      7.0 mmHg LVOT Vmax:         116.00 cm/s LVOT Vmean:        81.400 cm/s LVOT VTI:          0.245 m LVOT/AV VTI ratio: 0.72  AORTA Ao Root diam: 3.10 cm MITRAL VALVE MV Area (PHT): 2.55 cm    SHUNTS MV Area VTI:   2.88 cm    Systemic VTI:  0.24 m MV Peak grad:  3.3 mmHg    Systemic Diam: 2.00 cm MV Mean grad:  1.0 mmHg MV Vmax:       0.90 m/s MV Vmean:      56.6 cm/s MV Decel Time: 297 msec MV E velocity: 72.00 cm/s MV A velocity: 67.70 cm/s MV E/A ratio:  1.06 Christopher End MD Electronically signed by Nelva Bush MD Signature Date/Time: 08/28/2021/1:42:50 PM    Final      Medications:    sodium chloride     sodium chloride Stopped (08/28/21 0114)    aspirin  81 mg Oral Daily   carvedilol  6.25 mg Oral BID WC   Chlorhexidine Gluconate Cloth  6 each Topical Daily   clonazePAM  0.5 mg Oral QHS   enoxaparin (LOVENOX) injection  55 mg Subcutaneous Q24H   mycophenolate  750 mg Oral BID   [START ON 08/30/2021] prasugrel  10 mg Oral Daily   rosuvastatin  40 mg Oral Daily   sodium chloride flush  3 mL Intravenous Q12H   tacrolimus  3 mg Oral Daily   And   tacrolimus  2 mg Oral QPM   sodium chloride, acetaminophen, fentaNYL (SUBLIMAZE) injection, nitroGLYCERIN, ondansetron (ZOFRAN) IV, sodium chloride flush  Assessment/ Plan:  55 y.o. 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    1.  ESRD status post renal transplantation status.  Baseline creatinine 1.27.  Creatinine currently 1.3 which is close to baseline.  Maintain the patient on tacrolimus 3 mg in a.m. and 2 mg in the p.m. as well as mycophenolate 750 mg twice daily.  I advised the patient that he should follow-up with Dr. Ardyth Man post discharge given the fact that he did have cardiac catheterization  and received intravenous dye.  He verbalized understanding.  Patient also advised to avoid NSAIDs other than aspirin.  2.  ST elevation myocardial infarction.  Status post PCI to the LAD.  Recommend continued follow-up with cardiology.  3.  Hypertension.  Maintain the patient on carvedilol.  Consider losartan as an outpatient but defer to his primary transplant nephrologist.  LOS: 2 Tavius Turgeon 9/7/20223:39 PM

## 2021-08-29 NOTE — Progress Notes (Signed)
Progress Note  Patient Name: Chad Mccann Date of Encounter: 08/29/2021  [facility] Cardiologist: Dr. Fletcher Anon  Subjective   Denies chest pain or shortness of breath.  No acute events overnight.  Inpatient Medications    Scheduled Meds:  aspirin  81 mg Oral Daily   carvedilol  6.25 mg Oral BID WC   Chlorhexidine Gluconate Cloth  6 each Topical Daily   clonazePAM  0.5 mg Oral QHS   enoxaparin (LOVENOX) injection  55 mg Subcutaneous Q24H   mycophenolate  750 mg Oral BID   [START ON 08/30/2021] prasugrel  10 mg Oral Daily   rosuvastatin  40 mg Oral Daily   sodium chloride flush  3 mL Intravenous Q12H   tacrolimus  3 mg Oral Daily   And   tacrolimus  2 mg Oral QPM   Continuous Infusions:  sodium chloride     sodium chloride Stopped (08/28/21 0114)   PRN Meds: sodium chloride, acetaminophen, fentaNYL (SUBLIMAZE) injection, nitroGLYCERIN, ondansetron (ZOFRAN) IV, sodium chloride flush   Vital Signs    Vitals:   08/29/21 0453 08/29/21 0800 08/29/21 0832 08/29/21 1100  BP: (!) 159/81   (!) 130/92  Pulse:   69   Resp:    17  Temp:  100.1 F (37.8 C)  99.4 F (37.4 C)  TempSrc:  Oral  Oral  SpO2:      Weight:      Height:        Intake/Output Summary (Last 24 hours) at 08/29/2021 1540 Last data filed at 08/29/2021 0900 Gross per 24 hour  Intake 480 ml  Output 1875 ml  Net -1395 ml   Last 3 Weights 08/27/2021 05/29/2021 12/01/2020  Weight (lbs) 238 lb 1.6 oz 233 lb 238 lb  Weight (kg) 108 kg 105.688 kg 107.956 kg      Telemetry    Sinus rhythm- Personally Reviewed  ECG     - Personally Reviewed  Physical Exam   GEN: No acute distress.   Neck: No JVD Cardiac: RRR, no murmurs, rubs, or gallops.  Respiratory: Clear to auscultation bilaterally. GI: Soft, nontender, non-distended  MS: No edema; No deformity. Neuro:  Nonfocal  Psych: Normal affect   Labs    High Sensitivity Troponin:   Recent Labs  Lab 08/27/21 1243 08/27/21 1445 08/27/21 1637  08/27/21 1841  TROPONINIHS 54* 102* 157* 395*      Chemistry Recent Labs  Lab 08/27/21 1243 08/28/21 0420  NA 137 136  K 4.5 4.2  CL 104 103  CO2 27 28  GLUCOSE 118* 102*  BUN 22* 17  CREATININE 1.61* 1.30*  CALCIUM 9.5 9.3  PROT 7.8 7.0  ALBUMIN 4.5 3.7  AST 14* 25  ALT 13 14  ALKPHOS 57 52  BILITOT 1.1 1.8*  GFRNONAA 50* >60  ANIONGAP 6 5     Hematology Recent Labs  Lab 08/27/21 1243 08/28/21 0420  WBC 5.5 7.9  RBC 5.42 4.88  HGB 17.7* 16.1  HCT 49.0 44.4  MCV 90.4 91.0  MCH 32.7 33.0  MCHC 36.1* 36.3*  RDW 13.2 13.2  PLT 199 180    BNPNo results for input(s): BNP, PROBNP in the last 168 hours.   DDimer No results for input(s): DDIMER in the last 168 hours.   Radiology    ECHOCARDIOGRAM COMPLETE  Result Date: 08/28/2021    ECHOCARDIOGRAM REPORT   Patient Name:   ANATOLI NOLA Date of Exam: 08/28/2021 Medical Rec #:  [id number]  Height:       70.0 in Accession #:    [id number]       Weight:       238.1 lb Date of Birth:  1966-07-16        BSA:          2.248 m Patient Age:    62 years         BP:           148/80 mmHg Patient Gender: M                HR:           61 bpm. Exam Location:  [facility] Procedure: 2D Echo, Color Doppler and Cardiac Doppler Indications:     I21.9 Acute myocardial infarction, unspecified  History:         Patient has no prior history of Echocardiogram examinations.                  Risk Factors:Hypertension and Sleep Apnea.  Sonographer:     Charmayne Sheer Referring Phys:  [id number] A ARIDA Diagnosing Phys: Nelva Bush MD  Sonographer Comments: Suboptimal parasternal window. IMPRESSIONS  1. Left ventricular ejection fraction, by estimation, is 55 to 60%. The left ventricle has normal function. The left ventricle has no regional wall motion abnormalities. Left ventricular diastolic parameters are consistent with Grade II diastolic dysfunction (pseudonormalization).  2. Right ventricular systolic function is normal. The right  ventricular size is normal.  3. The mitral valve is normal in structure. Trivial mitral valve regurgitation. No evidence of mitral stenosis.  4. The aortic valve is tricuspid. Aortic valve regurgitation is not visualized. No aortic stenosis is present.  5. The inferior vena cava is normal in size with greater than 50% respiratory variability, suggesting right atrial pressure of 3 mmHg. FINDINGS  Left Ventricle: Left ventricular ejection fraction, by estimation, is 55 to 60%. The left ventricle has normal function. The left ventricle has no regional wall motion abnormalities. The left ventricular internal cavity size was normal in size. There is  no left ventricular hypertrophy. Left ventricular diastolic parameters are consistent with Grade II diastolic dysfunction (pseudonormalization). Right Ventricle: The right ventricular size is normal. No increase in right ventricular wall thickness. Right ventricular systolic function is normal. Left Atrium: Left atrial size was normal in size. Right Atrium: Right atrial size was normal in size. Pericardium: There is no evidence of pericardial effusion. Mitral Valve: The mitral valve is normal in structure. Trivial mitral valve regurgitation. No evidence of mitral valve stenosis. MV peak gradient, 3.3 mmHg. The mean mitral valve gradient is 1.0 mmHg. Tricuspid Valve: The tricuspid valve is grossly normal. Tricuspid valve regurgitation is trivial. Aortic Valve: The aortic valve is tricuspid. Aortic valve regurgitation is not visualized. No aortic stenosis is present. Aortic valve mean gradient measures 7.0 mmHg. Aortic valve peak gradient measures 12.4 mmHg. Aortic valve area, by VTI measures 2.26  cm. Pulmonic Valve: The pulmonic valve was grossly normal. Pulmonic valve regurgitation is trivial. No evidence of pulmonic stenosis. Aorta: The aortic root is normal in size and structure. Pulmonary Artery: The pulmonary artery is not well seen. Venous: The inferior vena cava is  normal in size with greater than 50% respiratory variability, suggesting right atrial pressure of 3 mmHg. IAS/Shunts: No atrial level shunt detected by color flow Doppler.  LEFT VENTRICLE PLAX 2D LVIDd:         4.50 cm      Diastology LVIDs:  3.10 cm      LV e' medial:    5.33 cm/s LV PW:         1.00 cm      LV E/e' medial:  13.5 LV IVS:        1.00 cm      LV e' lateral:   6.64 cm/s LVOT diam:     2.00 cm      LV E/e' lateral: 10.8 LV SV:         77 LV SV Index:   34 LVOT Area:     3.14 cm  LV Volumes (MOD) LV vol d, MOD A2C: 106.0 ml LV vol d, MOD A4C: 113.0 ml LV vol s, MOD A2C: 49.6 ml LV vol s, MOD A4C: 52.8 ml LV SV MOD A2C:     56.4 ml LV SV MOD A4C:     113.0 ml LV SV MOD BP:      58.1 ml RIGHT VENTRICLE RV Basal diam:  3.50 cm RV Mid diam:    4.60 cm TAPSE (M-mode): 2.9 cm LEFT ATRIUM             Index       RIGHT ATRIUM           Index LA diam:        4.20 cm 1.87 cm/m  RA Area:     15.40 cm LA Vol (A2C):   49.6 ml 22.07 ml/m RA Volume:   42.70 ml  19.00 ml/m LA Vol (A4C):   32.7 ml 14.55 ml/m LA Biplane Vol: 43.8 ml 19.49 ml/m  AORTIC VALVE                    PULMONIC VALVE AV Area (Vmax):    2.07 cm     PV Vmax:       1.20 m/s AV Area (Vmean):   1.98 cm     PV Vmean:      86.700 cm/s AV Area (VTI):     2.26 cm     PV VTI:        0.238 m AV Vmax:           176.00 cm/s  PV Peak grad:  5.8 mmHg AV Vmean:          129.000 cm/s PV Mean grad:  3.0 mmHg AV VTI:            0.340 m AV Peak Grad:      12.4 mmHg AV Mean Grad:      7.0 mmHg LVOT Vmax:         116.00 cm/s LVOT Vmean:        81.400 cm/s LVOT VTI:          0.245 m LVOT/AV VTI ratio: 0.72  AORTA Ao Root diam: 3.10 cm MITRAL VALVE MV Area (PHT): 2.55 cm    SHUNTS MV Area VTI:   2.88 cm    Systemic VTI:  0.24 m MV Peak grad:  3.3 mmHg    Systemic Diam: 2.00 cm MV Mean grad:  1.0 mmHg MV Vmax:       0.90 m/s MV Vmean:      56.6 cm/s MV Decel Time: 297 msec MV E velocity: 72.00 cm/s MV A velocity: 67.70 cm/s MV E/A ratio:  1.06  Harrell Gave End MD Electronically signed by Nelva Bush MD Signature Date/Time: 08/28/2021/1:42:50 PM    Final     Cardiac Studies   TTE 08/29/2021 1.  Left ventricular ejection fraction, by estimation, is 55 to 60%. The  left ventricle has normal function. The left ventricle has no regional  wall motion abnormalities. Left ventricular diastolic parameters are  consistent with Grade II diastolic  dysfunction (pseudonormalization).   2. Right ventricular systolic function is normal. The right ventricular  size is normal.   3. The mitral valve is normal in structure. Trivial mitral valve  regurgitation. No evidence of mitral stenosis.   4. The aortic valve is tricuspid. Aortic valve regurgitation is not  visualized. No aortic stenosis is present.   5. The inferior vena cava is normal in size with greater than 50%  respiratory variability, suggesting right atrial pressure of 3 mmHg.   Lhc 08/27/2021   Mid LM to Dist LM lesion is 30% stenosed.   Mid LAD lesion is 99% stenosed.   Ost LAD lesion is 30% stenosed.   Prox RCA lesion is 30% stenosed.   Prox RCA to Mid RCA lesion is 40% stenosed.   RPDA lesion is 30% stenosed.   A drug-eluting stent was successfully placed using a STENT ONYX FRONTIER 3.0X22.   Post intervention, there is a 0% residual stenosis.   1.  Severe one-vessel coronary artery disease with 99% thrombotic stenosis in the mid LAD which is the culprit for anterior STEMI.  Mild to moderate left main and RCA disease. 2.  Left ventricular angiography was not performed due to chronic kidney disease.  LVEDP was significantly elevated at 32 mmHg. 3.  Successful angioplasty and drug-eluting stent placement to the mid LAD.  Patient Profile     55 y.o. male with history of hypertension, FSGS s/p renal transplant presenting with chest pain diagnosed with anterior STEMI s/p DES to mid LAD.  Assessment & Plan    Anterior STEMI s/p DES to mid LAD -Currently denies chest pain -His  insurance does not cover Brilinta. -Transition to Effient with load loading per protocol.  60 mg x 1, 10 mg daily after. -Continue aspirin, Crestor.  2.  Hypertension -Creatinine improving -Coreg, restart PTA telmisartan.  3.  History of renal transplant -Creatinine improving after heart cath -Continue PTA tacrolimus, telmisartan.   Patient can be discharged from a cardiac perspective on above medications.  Close follow-up as outpatient recommended.  Signed, Kate Sable, MD  08/29/2021, 3:40 PM

## 2021-09-04 ENCOUNTER — Encounter: Payer: Self-pay | Admitting: Internal Medicine

## 2021-09-04 ENCOUNTER — Other Ambulatory Visit: Payer: Self-pay

## 2021-09-04 ENCOUNTER — Ambulatory Visit (INDEPENDENT_AMBULATORY_CARE_PROVIDER_SITE_OTHER): Payer: BC Managed Care – PPO | Admitting: Internal Medicine

## 2021-09-04 VITALS — BP 144/74 | HR 61 | Ht 70.0 in | Wt 238.5 lb

## 2021-09-04 DIAGNOSIS — D472 Monoclonal gammopathy: Secondary | ICD-10-CM

## 2021-09-04 DIAGNOSIS — I2102 ST elevation (STEMI) myocardial infarction involving left anterior descending coronary artery: Secondary | ICD-10-CM | POA: Diagnosis not present

## 2021-09-04 DIAGNOSIS — N401 Enlarged prostate with lower urinary tract symptoms: Secondary | ICD-10-CM | POA: Diagnosis not present

## 2021-09-04 DIAGNOSIS — I10 Essential (primary) hypertension: Secondary | ICD-10-CM

## 2021-09-04 DIAGNOSIS — K219 Gastro-esophageal reflux disease without esophagitis: Secondary | ICD-10-CM | POA: Diagnosis not present

## 2021-09-04 DIAGNOSIS — E782 Mixed hyperlipidemia: Secondary | ICD-10-CM

## 2021-09-04 DIAGNOSIS — N138 Other obstructive and reflux uropathy: Secondary | ICD-10-CM

## 2021-09-04 NOTE — Assessment & Plan Note (Signed)
-   The patient's GERD is stable on medication.  - Instructed the patient to avoid eating spicy and acidic foods, as well as foods high in fat. - Instructed the patient to avoid eating large meals or meals 2-3 hours prior to sleeping. 

## 2021-09-04 NOTE — Assessment & Plan Note (Addendum)
Patient has STEMI, was treated  In [facility], is doing well, he has a small ecchymosis on the right lower abdomen which is healing good.  He denies any chest pain or shortness of breath there is no arrhythmia.  He does not smoke does not drink.  He was advised to lose weight.

## 2021-09-04 NOTE — Assessment & Plan Note (Signed)
Stable at the present time. 

## 2021-09-04 NOTE — Assessment & Plan Note (Signed)
Hypercholesterolemia  I advised the patient to follow Mediterranean diet This diet is rich in fruits vegetables and whole grain, and This diet is also rich in fish and lean meat Patient should also eat a handful of almonds or walnuts daily Recent heart study indicated that average follow-up on this kind of diet reduces the cardiovascular mortality by 50 to 70%== 

## 2021-09-04 NOTE — Progress Notes (Signed)
Established Patient Office Visit  Subjective:  Patient ID: Chad Mccann, male    DOB: Feb 14, 1966  Age: 55 y.o. MRN: [id number]  CC:  Chief Complaint  Patient presents with  . Follow-up    Follow up after hospital visit mon - wed of last week (09/05-09/07). Patient state that he was hospitalized for heart attack last week. Heart cath was done and stent was placed.      HPI  Chad Mccann presents for follow up after ht  attack  Past Medical History:  Diagnosis Date  . Benign prostatic hypertrophy   . Bladder spasm   . Erectile dysfunction   . Frequency   . GERD (gastroesophageal reflux disease)   . Hypertension   . Hypogonadism in male   . Kidney 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   . Kidney, malrotation   . MGUS (monoclonal gammopathy of unknown significance)   . Monoclonal paraproteinemia   . Obesity, unspecified   . Other specified congenital anomaly of kidney   . Other specified disorders of bladder   . Other testicular hypofunction   . Premature ejaculation   . Sleep apnea   . Ventral hernia     Past Surgical History:  Procedure Laterality Date  . BACK SURGERY     c5/6 fusion after discectomy 3 weeks prior  . COLONOSCOPY     polyp removal  . CORONARY/GRAFT ACUTE MI REVASCULARIZATION N/A 08/27/2021   Procedure: Coronary/Graft Acute MI Revascularization;  Surgeon: Wellington Hampshire, MD;  Location: [facility];  Service: Cardiovascular;  Laterality: N/A;  . dialysis port placement     and removal  . HERNIA REPAIR  12/29/13   ventral  . KIDNEY TRANSPLANT  01/2012  . LEFT HEART CATH AND CORONARY ANGIOGRAPHY N/A 08/27/2021   Procedure: LEFT HEART CATH AND CORONARY ANGIOGRAPHY;  Surgeon: Wellington Hampshire, MD;  Location: [facility];  Service: Cardiovascular;  Laterality: N/A;  . SPINE SURGERY     cervical fusion  . TOTAL HIP ARTHROPLASTY Left 03/02/2019   Procedure: TOTAL HIP ARTHROPLASTY ANTERIOR APPROACH-LEFT;  Surgeon: Hessie Knows, MD;  Location: [facility]  [facility];  Service: Orthopedics;  Laterality: Left;    Family History  Problem Relation Age of Onset  . Colon cancer Father 60  . Heart attack Father   . Stroke Brother   . Colon cancer Maternal Grandfather   . Kidney disease Neg Hx   . Prostate cancer Neg Hx   . Kidney cancer Neg Hx   . Bladder Cancer Neg Hx     Social History   Socioeconomic History  . Marital status: Married    Spouse name: Not on file  . Number of children: Not on file  . Years of education: Not on file  . Highest education level: Not on file  Occupational History  . Occupation: Hotel manager  Tobacco Use  . Smoking status: Every Day    Types: Cigars  . Smokeless tobacco: Never  Substance and Sexual Activity  . Alcohol use: Yes    Alcohol/week: 0.0 standard drinks    Comment: occ  . Drug use: No  . Sexual activity: Yes  Other Topics Concern  . Not on file  Social History Narrative  . Not on file   Social Determinants of Health   Financial Resource Strain: Not on file  Food Insecurity: Not on file  Transportation Needs: Not on file  Physical Activity: Not on file  Stress: Not on file  Social Connections:  Not on file  Intimate Partner Violence: Not on file     Current Outpatient Medications:  .  acetaminophen (TYLENOL) 325 MG tablet, Take 1-2 tablets (325-650 mg total) by mouth every 6 (six) hours as needed for mild pain (pain score 1-3 or temp > 100.5)., Disp: , Rfl:  .  aspirin 81 MG chewable tablet, Chew 1 tablet (81 mg total) by mouth daily., Disp: 30 tablet, Rfl: 11 .  carvedilol (COREG) 6.25 MG tablet, Take 1 tablet (6.25 mg total) by mouth 2 (two) times daily with a meal., Disp: 60 tablet, Rfl: 3 .  clonazePAM (KLONOPIN) 0.5 MG tablet, Take 0.5 mg by mouth at bedtime., Disp: , Rfl:  .  lidocaine-prilocaine (EMLA) cream, Apply 1 application topically as needed., Disp: 30 g, Rfl: 0 .  mycophenolate (CELLCEPT) 250 MG capsule, Take 750 mg by mouth., Disp: , Rfl:  .  nitroGLYCERIN  (NITROSTAT) 0.4 MG SL tablet, Place 1 tablet (0.4 mg total) under the tongue every 5 (five) minutes as needed for chest pain., Disp: 30 tablet, Rfl: 12 .  prasugrel (EFFIENT) 10 MG TABS tablet, Take 1 tablet (10 mg total) by mouth daily., Disp: 30 tablet, Rfl: 10 .  rosuvastatin (CRESTOR) 40 MG tablet, Take 1 tablet (40 mg total) by mouth daily., Disp: 30 tablet, Rfl: 3 .  tacrolimus (PROGRAF) 1 MG capsule, Take 2 mg by mouth 2 (two) times daily., Disp: , Rfl:  .  tadalafil (CIALIS) 20 MG tablet, Take 1 tablet (20 mg total) by mouth daily as needed for erectile dysfunction., Disp: 6 tablet, Rfl: 11 .  telmisartan (MICARDIS) 20 MG tablet, , Disp: , Rfl:  .  testosterone cypionate (DEPOTESTOSTERONE CYPIONATE) 200 MG/ML injection, Inject 0.75  mL into the muscle every 14 days, Disp: 10 mL, Rfl: 0   Allergies  Allergen Reactions  . Hydralazine Other (See Comments)    Nose bleeds/light headed  . Morphine And Related Other (See Comments)    "difficulty waking patient"  . Prednisone     hallucinations  . Shellfish Allergy Nausea And Vomiting    ROS Review of Systems  Constitutional: Negative.   HENT: Negative.    Eyes: Negative.   Respiratory: Negative.    Cardiovascular: Negative.   Gastrointestinal: Negative.   Endocrine: Negative.   Genitourinary: Negative.   Musculoskeletal: Negative.   Skin: Negative.   Allergic/Immunologic: Negative.   Neurological: Negative.   Hematological: Negative.   Psychiatric/Behavioral: Negative.    All other systems reviewed and are negative.    Objective:    Physical Exam Vitals reviewed.  Constitutional:      Appearance: Normal appearance.  HENT:     Mouth/Throat:     Mouth: Mucous membranes are moist.  Eyes:     Pupils: Pupils are equal, round, and reactive to light.  Neck:     Vascular: No carotid bruit.  Cardiovascular:     Rate and Rhythm: Normal rate and regular rhythm.     Pulses: Normal pulses.     Heart sounds: Normal heart  sounds.  Pulmonary:     Effort: Pulmonary effort is normal.     Breath sounds: Normal breath sounds.  Abdominal:     General: Bowel sounds are normal.     Palpations: Abdomen is soft. There is no hepatomegaly, splenomegaly or mass.     Tenderness: There is no abdominal tenderness.     Hernia: No hernia is present.  Musculoskeletal:     Cervical back: Neck supple.  Right lower leg: No edema.     Left lower leg: No edema.  Skin:    Findings: No rash.  Neurological:     Mental Status: He is alert and oriented to person, place, and time.     Motor: No weakness.  Psychiatric:        Mood and Affect: Mood normal.        Behavior: Behavior normal.    BP (!) 144/74   Pulse 61   Ht '5\' 10"'$  (1.778 m)   Wt 238 lb 8 oz (108.2 kg)   BMI 34.22 kg/m  Wt Readings from Last 3 Encounters:  09/04/21 238 lb 8 oz (108.2 kg)  08/27/21 238 lb 1.6 oz (108 kg)  05/29/21 233 lb (105.7 kg)     Health Maintenance Due  Topic Date Due  . COVID-19 Vaccine (1) Never done  . HIV Screening  Never done  . Hepatitis C Screening  Never done  . Zoster Vaccines- Shingrix (1 of 2) Never done  . COLONOSCOPY (Pts 45-93yr Insurance coverage will need to be confirmed)  Never done  . Pneumococcal Vaccine [id number] old (2 - PPSV23 or PCV20) 03/13/2017  . INFLUENZA VACCINE  Never done    There are no preventive care reminders to display for this patient.  Lab Results  Component Value Date   TSH 2.290 11/28/2015   Lab Results  Component Value Date   WBC 7.9 08/28/2021   HGB 16.1 08/28/2021   HCT 44.4 08/28/2021   MCV 91.0 08/28/2021   PLT 180 08/28/2021   Lab Results  Component Value Date   NA 136 08/28/2021   K 4.2 08/28/2021   CO2 28 08/28/2021   GLUCOSE 102 (H) 08/28/2021   BUN 17 08/28/2021   CREATININE 1.30 (H) 08/28/2021   BILITOT 1.8 (H) 08/28/2021   ALKPHOS 52 08/28/2021   AST 25 08/28/2021   ALT 14 08/28/2021   PROT 7.0 08/28/2021   ALBUMIN 3.7 08/28/2021   CALCIUM 9.3  08/28/2021   ANIONGAP 5 08/28/2021   Lab Results  Component Value Date   CHOL 143 08/27/2021   Lab Results  Component Value Date   HDL 28 (L) 08/27/2021   Lab Results  Component Value Date   LDLCALC 82 08/27/2021   Lab Results  Component Value Date   TRIG 166 (H) 08/27/2021   Lab Results  Component Value Date   CHOLHDL 5.1 08/27/2021   Lab Results  Component Value Date   HGBA1C 5.5 08/27/2021      Assessment & Plan:   Problem List Items Addressed This Visit       Cardiovascular and Mediastinum   Essential (primary) hypertension     Patient denies any chest pain or shortness of breath there is no history of palpitation or paroxysmal nocturnal dyspnea   patient was advised to follow low-salt low-cholesterol diet    ideally I want to keep systolic blood pressure below 130 mmHg, patient was asked to check blood pressure one times a week and give me a report on that.  Patient will be follow-up in 3 months  or earlier as needed, patient will call me back for any change in the cardiovascular symptoms Patient was advised to buy a book from local bookstore concerning blood pressure and read several chapters  every day.  This will be supplemented by some of the material we will give him from the office.  Patient should also utilize other resources like YouTube and Internet to learn  more about the blood pressure and the diet.      STEMI involving left anterior descending coronary artery [facility])    Patient has STEMI, was treated  In [facility], is doing well, he has a small ecchymosis on the right lower abdomen which is healing good.  He denies any chest pain or shortness of breath there is no arrhythmia.  He does not smoke does not drink.  He was advised to lose weight.        Digestive   Acid reflux - Primary    - The patient's GERD is stable on medication.  - Instructed the patient to avoid eating spicy and acidic foods, as well as foods high in fat. - Instructed the patient to  avoid eating large meals or meals 2-3 hours prior to sleeping.        Genitourinary   BPH with obstruction/lower urinary tract symptoms    Stable at the present time        Other   MGUS (monoclonal gammopathy of unknown significance)    Stable      HLD (hyperlipidemia)    Hypercholesterolemia  I advised the patient to follow Mediterranean diet This diet is rich in fruits vegetables and whole grain, and This diet is also rich in fish and lean meat Patient should also eat a handful of almonds or walnuts daily Recent heart study indicated that average follow-up on this kind of diet reduces the cardiovascular mortality by 50 to 70%==       No orders of the defined types were placed in this encounter.   Follow-up: No follow-ups on file.    [name], MD

## 2021-09-04 NOTE — Assessment & Plan Note (Signed)
Stable

## 2021-09-04 NOTE — Assessment & Plan Note (Signed)

## 2021-09-09 NOTE — Progress Notes (Signed)
Cardiology Office Note:    Date:  09/10/2021   ID:  Chad Mccann, DOB 1966/11/04, MRN [id number]  PCP:  [name], MD  [facility] Cardiologist:  Dr[facility] Electrophysiologist:  None   Referring MD: [name], MD   Chief Complaint: Hospital follow-up  History of Present Illness:    Chad Mccann is a 55 y.o. 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   Patient was admitted earlier this month for anterior STEMI s/p DES to mLAD. Echo showed LVEF 55-60%, G2DD, trivial MR. Insurance would not cover Brilinta. He was transitioned to Effient. He was started on Aspirin, coreg, Crestor.   Today, patient reports no problems since discharge. He has been taking it fairly easy. Walking around the house, no chest pain or sob. Has been taking aspirin and Effient, no bleeding issues. He has sleep apnea, but has not been using  his machine. He is interested in seeing pulmonology. He works as a Freight forwarder at a [employer]. He is not lifting things. Would like to start half a day, can also work from home. No formal activity before hospitalization. Diet is better, lost 60lbs over the last year. Right wrist healed well with no complications. Has hematoma from lovenox injection, says it's improving. He will continue to monitor this at home.  Past Medical History:  Diagnosis Date   Benign prostatic hypertrophy    Bladder spasm    Erectile dysfunction    Frequency    GERD (gastroesophageal reflux disease)    Hypertension    Hypogonadism in male    Kidney 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    Kidney, malrotation    MGUS (monoclonal gammopathy of unknown significance)    Monoclonal paraproteinemia    Obesity, unspecified    Other specified congenital anomaly of kidney    Other specified disorders of bladder    Other testicular hypofunction    Premature ejaculation    Sleep apnea    Ventral hernia     Past  Surgical History:  Procedure Laterality Date   BACK SURGERY     c5/6 fusion after discectomy 3 weeks prior   COLONOSCOPY     polyp removal   CORONARY/GRAFT ACUTE MI REVASCULARIZATION N/A 08/27/2021   Procedure: Coronary/Graft Acute MI Revascularization;  Surgeon: Wellington Hampshire, MD;  Location: [facility];  Service: Cardiovascular;  Laterality: N/A;   dialysis port placement     and removal   HERNIA REPAIR  12/29/13   ventral   KIDNEY TRANSPLANT  01/2012   LEFT HEART CATH AND CORONARY ANGIOGRAPHY N/A 08/27/2021   Procedure: LEFT HEART CATH AND CORONARY ANGIOGRAPHY;  Surgeon: Wellington Hampshire, MD;  Location: [facility];  Service: Cardiovascular;  Laterality: N/A;   SPINE SURGERY     cervical fusion   TOTAL HIP ARTHROPLASTY Left 03/02/2019   Procedure: TOTAL HIP ARTHROPLASTY ANTERIOR APPROACH-LEFT;  Surgeon: Hessie Knows, MD;  Location: [facility];  Service: Orthopedics;  Laterality: Left;    Current Medications: Current Meds  Medication Sig   acetaminophen (TYLENOL) 325 MG tablet Take 1-2 tablets (325-650 mg total) by mouth every 6 (six) hours as needed for mild pain (pain score 1-3 or temp > 100.5).   aspirin 81 MG chewable tablet Chew 1 tablet (81 mg total) by mouth daily.   carvedilol (COREG) 6.25 MG tablet Take 1 tablet (6.25 mg total) by mouth 2 (two) times  daily with a meal.   clonazePAM (KLONOPIN) 0.5 MG tablet Take 0.5 mg by mouth at bedtime.   lidocaine-prilocaine (EMLA) cream Apply 1 application topically as needed.   mycophenolate (CELLCEPT) 250 MG capsule Take 750 mg by mouth 2 (two) times daily.   nitroGLYCERIN (NITROSTAT) 0.4 MG SL tablet Place 1 tablet (0.4 mg total) under the tongue every 5 (five) minutes as needed for chest pain.   prasugrel (EFFIENT) 10 MG TABS tablet Take 1 tablet (10 mg total) by mouth daily.   rosuvastatin (CRESTOR) 40 MG tablet Take 1 tablet (40 mg total) by mouth daily.   tacrolimus (PROGRAF) 1 MG capsule Take 2 mg by mouth 2 (two)  times daily.   tadalafil (CIALIS) 20 MG tablet Take 1 tablet (20 mg total) by mouth daily as needed for erectile dysfunction.   telmisartan (MICARDIS) 20 MG tablet Take 20 mg by mouth daily.   testosterone cypionate (DEPOTESTOSTERONE CYPIONATE) 200 MG/ML injection Inject 0.75  mL into the muscle every 14 days     Allergies:   Hydralazine, Morphine and related, Prednisone, and Shellfish allergy   Social History   Socioeconomic History   Marital status: Married    Spouse name: Not on file   Number of children: Not on file   Years of education: Not on file   Highest education level: Not on file  Occupational History   Occupation: Hotel manager  Tobacco Use   Smoking status: Every Day    Types: Cigars   Smokeless tobacco: Never  Substance and Sexual Activity   Alcohol use: Yes    Alcohol/week: 0.0 standard drinks    Comment: occ   Drug use: No   Sexual activity: Yes  Other Topics Concern   Not on file  Social History Narrative   Not on file   Social Determinants of Health   Financial Resource Strain: Not on file  Food Insecurity: Not on file  Transportation Needs: Not on file  Physical Activity: Not on file  Stress: Not on file  Social Connections: Not on file     Family History: The patient's family history includes Colon cancer in his maternal grandfather; Colon cancer (age of onset: 41) in his father; Heart attack in his father; Stroke in his brother. There is no history of Kidney disease, Prostate cancer, Kidney cancer, or Bladder Cancer.  ROS:   Please see the history of present illness.     All other systems reviewed and are negative.  EKGs/Labs/Other Studies Reviewed:    The following studies were reviewed today:  Echo 08/2021  1. Left ventricular ejection fraction, by estimation, is 55 to 60%. The  left ventricle has normal function. The left ventricle has no regional  wall motion abnormalities. Left ventricular diastolic parameters are   consistent with Grade II diastolic  dysfunction (pseudonormalization).   2. Right ventricular systolic function is normal. The right ventricular  size is normal.   3. The mitral valve is normal in structure. Trivial mitral valve  regurgitation. No evidence of mitral stenosis.   4. The aortic valve is tricuspid. Aortic valve regurgitation is not  visualized. No aortic stenosis is present.   5. The inferior vena cava is normal in size with greater than 50%  respiratory variability, suggesting right atrial pressure of 3 mmHg.   Cardiac cath 08/2021   Mid LM to Dist LM lesion is 30% stenosed.   Mid LAD lesion is 99% stenosed.   Ost LAD lesion is 30% stenosed.  Prox RCA lesion is 30% stenosed.   Prox RCA to Mid RCA lesion is 40% stenosed.   RPDA lesion is 30% stenosed.   A drug-eluting stent was successfully placed using a STENT ONYX FRONTIER 3.0X22.   Post intervention, there is a 0% residual stenosis.   1.  Severe one-vessel coronary artery disease with 99% thrombotic stenosis in the mid LAD which is the culprit for anterior STEMI.  Mild to moderate left main and RCA disease. 2.  Left ventricular angiography was not performed due to chronic kidney disease.  LVEDP was significantly elevated at 32 mmHg. 3.  Successful angioplasty and drug-eluting stent placement to the mid LAD.   Recommendations: Dual antiplatelet therapy for at least 12 months. Aggressive treatment of risk factors..  I switched atorvastatin to high-dose rosuvastatin. I switched metoprolol to carvedilol.  An ACE inhibitor or ARB can be resumed once renal function is stable. I requested an echocardiogram. Gentle hydration for renal protection.   Coronary Diagrams  Diagnostic Dominance: Right Intervention    EKG:  EKG is  ordered today.  The ekg ordered today demonstrates NSR, 66bpm, TWI lateral leads  Recent Labs: 08/28/2021: ALT 14; BUN 17; Creatinine, Ser 1.30; Hemoglobin 16.1; Platelets 180; Potassium 4.2;  Sodium 136  Recent Lipid Panel    Component Value Date/Time   CHOL 143 08/27/2021 1243   CHOL 142 12/01/2020 1353   TRIG 166 (H) 08/27/2021 1243   HDL 28 (L) 08/27/2021 1243   HDL 27 (L) 12/01/2020 1353   CHOLHDL 5.1 08/27/2021 1243   VLDL 33 08/27/2021 1243   LDLCALC 82 08/27/2021 1243   LDLCALC 90 12/01/2020 1353    Physical Exam:    VS:  BP 120/90 (BP Location: Left Arm, Patient Position: Sitting, Cuff Size: Large)   Pulse 66   Ht '5\' 10"'$  (1.778 m)   Wt 238 lb 2 oz (108 kg)   SpO2 98%   BMI 34.17 kg/m     Wt Readings from Last 3 Encounters:  09/10/21 238 lb 2 oz (108 kg)  09/04/21 238 lb 8 oz (108.2 kg)  08/27/21 238 lb 1.6 oz (108 kg)     GEN:  Well nourished, well developed in no acute distress HEENT: Normal NECK: No JVD; No carotid bruits LYMPHATICS: No lymphadenopathy CARDIAC: RRR, + murmur, no rubs, gallops RESPIRATORY:  Clear to auscultation without rales, wheezing or rhonchi  ABDOMEN: Soft, non-tender, non-distended MUSCULOSKELETAL:  No edema; No deformity  SKIN: Warm and dry NEUROLOGIC:  Alert and oriented x 3 PSYCHIATRIC:  Normal affect   ASSESSMENT:    1. Coronary artery disease involving native coronary artery of native heart with angina pectoris ([place])   2. Essential hypertension   3. History of renal transplant   4. Obstructive sleep apnea syndrome    PLAN:    In order of problems listed above:  Recent STEMI s/p DES to mLAD Recent hospitalization with cardiac catheterization showing severe 1 vessel CAD with 99% stenosis in mLAD treated with PCI/DES x1. Echo showed LVEF 55-60%, G2DD, no WMA, trivial MR. Patient has been doing well since discharge. Denies anginal symptoms. Cath site, right radial, has healed well. He denies bleeding issues with Aspirin and Effient. Check CBC today.  Also requesting note for work, ok since work is non-strenuous with no lifting. Discussed lifestyle changes in detail, he has lost 60lbs in the last year, he was  congratulated for this. Will refer to cardiac rehab.  HLD  LDL 82, goal less than 70. Continue  Crestor '40mg'$  daily. Can re-check lipid panel/LFTs at follow-up.  HTN BP good today. Continue Coreg and Telmisartan  H/o renal transplant AKI AKI during hospitalization. Re-check BMET today. Continue Tacrolimus and Telmisartan.  OSA Will refer to pulmonology for OSA and CPAP maintenance.   Disposition: Follow up in 1 month(s) with MD/APP    Signed, Edelmira Gallogly Ninfa Meeker, PA-C  09/10/2021 1:40 PM    [facility]

## 2021-09-10 ENCOUNTER — Encounter: Payer: Self-pay | Admitting: *Deleted

## 2021-09-10 ENCOUNTER — Encounter: Payer: Self-pay | Admitting: Medical

## 2021-09-10 ENCOUNTER — Ambulatory Visit (INDEPENDENT_AMBULATORY_CARE_PROVIDER_SITE_OTHER): Payer: BC Managed Care – PPO | Admitting: Medical

## 2021-09-10 ENCOUNTER — Other Ambulatory Visit: Payer: Self-pay

## 2021-09-10 VITALS — BP 120/90 | HR 66 | Ht 70.0 in | Wt 238.1 lb

## 2021-09-10 DIAGNOSIS — I25119 Atherosclerotic heart disease of native coronary artery with unspecified angina pectoris: Secondary | ICD-10-CM

## 2021-09-10 DIAGNOSIS — Z940 Kidney transplant status: Secondary | ICD-10-CM

## 2021-09-10 DIAGNOSIS — G4733 Obstructive sleep apnea (adult) (pediatric): Secondary | ICD-10-CM

## 2021-09-10 DIAGNOSIS — I10 Essential (primary) hypertension: Secondary | ICD-10-CM

## 2021-09-10 NOTE — Patient Instructions (Addendum)
Medication Instructions:  - Your physician recommends that you continue on your current medications as directed. Please refer to the Current Medication list given to you today.  *If you need a refill on your cardiac medications before your next appointment, please call your pharmacy*   Lab Work: - Your physician recommends that you have lab work today: BMP/ CBC  If you have labs (blood work) drawn today and your tests are completely normal, you will receive your results only by: MyChart Message (if you have MyChart) OR A paper copy in the mail If you have any lab test that is abnormal or we need to change your treatment, we will call you to review the results.   Testing/Procedures: 1) You have been referred to : [facility] for evaluation of sleep apnea.  Their office will call you directly to schedule. However, if you have not heard from them within the next 1-2 weeks to arrange an appointment, please call [phone] to follow up.  2) You have been referred to Cardiac Rehab at [facility]. This is a combination program including monitored exercise, dietary education, and support group. We strongly recommend participating in the program. Expect a phone call from them in approximately 1-2 weeks. If you do not hear from them, the phone number for cardiac rehab at [facility] is [phone].    Follow-Up: At [facility], you and your health needs are our priority.  As part of our continuing mission to provide you with exceptional heart care, we have created designated Provider Care Teams.  These Care Teams include your primary Cardiologist (physician) and Advanced Practice Providers (APPs -  Physician Assistants and Nurse Practitioners) who all work together to provide you with the care you need, when you need it.  We recommend signing up for the patient portal called "MyChart".  Sign up information is provided on this After Visit Summary.  MyChart is used to connect with patients for Virtual  Visits (Telemedicine).  Patients are able to view lab/test results, encounter notes, upcoming appointments, etc.  Non-urgent messages can be sent to your provider as well.   To learn more about what you can do with MyChart, go to [email or web address].    Your next appointment:   1 month(s)  The format for your next appointment:   In Person  Provider:   You may see Kathlyn Sacramento, MD or one of the following Advanced Practice Providers on your designated Care Team:   Murray Hodgkins, NP Christell Faith, PA-C Marrianne Mood, PA-C Cadence Kathlen Mody, [place]   Other Instructions  CPT codes: - Left heart catheterization: [phone] - Echocardiogram: 93306  Diagnosis codes: - STEMI involving left anterior descending coronary artery [facility]) [I21.02]  For any further question regarding procedure/ diagnosis codes please call the Billing Department at [phone]

## 2021-09-11 LAB — BASIC METABOLIC PANEL
BUN/Creatinine Ratio: 17 (ref 9–20)
BUN: 21 mg/dL (ref 6–24)
CO2: 21 mmol/L (ref 20–29)
Calcium: 9.7 mg/dL (ref 8.7–10.2)
Chloride: 104 mmol/L (ref 96–106)
Creatinine, Ser: 1.22 mg/dL (ref 0.76–1.27)
Glucose: 100 mg/dL — ABNORMAL HIGH (ref 65–99)
Potassium: 4.9 mmol/L (ref 3.5–5.2)
Sodium: 137 mmol/L (ref 134–144)
eGFR: 70 mL/min/{1.73_m2} (ref >59)

## 2021-09-11 LAB — CBC
Hematocrit: 46.8 % (ref 37.5–51.0)
Hemoglobin: 16.4 g/dL (ref 13.0–17.7)
MCH: 31.4 pg (ref 26.6–33.0)
MCHC: 35 g/dL (ref 31.5–35.7)
MCV: 90 fL (ref 79–97)
Platelets: 243 10*3/uL (ref 150–450)
RBC: 5.22 x10E6/uL (ref 4.14–5.80)
RDW: 12.5 % (ref 11.6–15.4)
WBC: 5.8 10*3/uL (ref 3.4–10.8)

## 2021-09-13 ENCOUNTER — Other Ambulatory Visit: Payer: Self-pay

## 2021-09-13 ENCOUNTER — Other Ambulatory Visit: Payer: BC Managed Care – PPO

## 2021-09-13 DIAGNOSIS — E349 Endocrine disorder, unspecified: Secondary | ICD-10-CM | POA: Diagnosis not present

## 2021-09-14 ENCOUNTER — Other Ambulatory Visit: Payer: Self-pay | Admitting: Urology

## 2021-09-14 DIAGNOSIS — E349 Endocrine disorder, unspecified: Secondary | ICD-10-CM

## 2021-09-14 LAB — HEMATOCRIT: Hematocrit: 46.7 % (ref 37.5–51.0)

## 2021-09-14 LAB — HEMOGLOBIN: Hemoglobin: 16 g/dL (ref 13.0–17.7)

## 2021-09-14 LAB — TESTOSTERONE: Testosterone: 804 ng/dL (ref 264–916)

## 2021-09-21 ENCOUNTER — Other Ambulatory Visit: Payer: Self-pay

## 2021-09-21 ENCOUNTER — Encounter: Payer: BC Managed Care – PPO | Attending: Cardiovascular Disease | Admitting: *Deleted

## 2021-09-21 DIAGNOSIS — I213 ST elevation (STEMI) myocardial infarction of unspecified site: Secondary | ICD-10-CM

## 2021-09-21 DIAGNOSIS — Z955 Presence of coronary angioplasty implant and graft: Secondary | ICD-10-CM

## 2021-09-21 NOTE — Progress Notes (Signed)
Initial telephone orientation completed. Diagnosis can be found in [facility] 9/5. EP orientation scheduled for Monday 10/10 at 3pm.

## 2021-10-01 ENCOUNTER — Other Ambulatory Visit: Payer: Self-pay

## 2021-10-01 ENCOUNTER — Encounter: Payer: BC Managed Care – PPO | Attending: Cardiovascular Disease | Admitting: *Deleted

## 2021-10-01 VITALS — Ht 70.75 in | Wt 237.5 lb

## 2021-10-01 DIAGNOSIS — I213 ST elevation (STEMI) myocardial infarction of unspecified site: Secondary | ICD-10-CM

## 2021-10-01 DIAGNOSIS — I252 Old myocardial infarction: Secondary | ICD-10-CM | POA: Insufficient documentation

## 2021-10-01 DIAGNOSIS — Z48812 Encounter for surgical aftercare following surgery on the circulatory system: Secondary | ICD-10-CM | POA: Insufficient documentation

## 2021-10-01 DIAGNOSIS — Z955 Presence of coronary angioplasty implant and graft: Secondary | ICD-10-CM | POA: Insufficient documentation

## 2021-10-01 NOTE — Progress Notes (Signed)
Cardiac Individual Treatment Plan  Patient Details  Name: Chad Mccann MRN: [id number] Date of Birth: 1966/04/27 Referring Provider:   Flowsheet Row Cardiac Rehab from 10/01/2021 in [facility] Cardiac and Pulmonary Rehab  Referring Provider Arida       Initial Encounter Date:  Flowsheet Row Cardiac Rehab from 10/01/2021 in [facility] Cardiac and Pulmonary Rehab  Date 10/01/21       Visit Diagnosis: ST elevation myocardial infarction (STEMI), unspecified artery [facility])  Status post coronary artery stent placement  Patient's Home Medications on Admission:  Current Outpatient Medications:    acetaminophen (TYLENOL) 325 MG tablet, Take 1-2 tablets (325-650 mg total) by mouth every 6 (six) hours as needed for mild pain (pain score 1-3 or temp > 100.5)., Disp: , Rfl:    aspirin 81 MG chewable tablet, Chew 1 tablet (81 mg total) by mouth daily., Disp: 30 tablet, Rfl: 11   carvedilol (COREG) 6.25 MG tablet, Take 1 tablet (6.25 mg total) by mouth 2 (two) times daily with a meal., Disp: 60 tablet, Rfl: 3   clonazePAM (KLONOPIN) 0.5 MG tablet, Take 0.5 mg by mouth at bedtime., Disp: , Rfl:    lidocaine-prilocaine (EMLA) cream, Apply 1 application topically as needed., Disp: 30 g, Rfl: 0   mycophenolate (CELLCEPT) 250 MG capsule, Take 750 mg by mouth 2 (two) times daily., Disp: , Rfl:    nitroGLYCERIN (NITROSTAT) 0.4 MG SL tablet, Place 1 tablet (0.4 mg total) under the tongue every 5 (five) minutes as needed for chest pain., Disp: 30 tablet, Rfl: 12   prasugrel (EFFIENT) 10 MG TABS tablet, Take 1 tablet (10 mg total) by mouth daily., Disp: 30 tablet, Rfl: 10   rosuvastatin (CRESTOR) 40 MG tablet, Take 1 tablet (40 mg total) by mouth daily., Disp: 30 tablet, Rfl: 3   tacrolimus (PROGRAF) 1 MG capsule, Take 2 mg by mouth 2 (two) times daily., Disp: , Rfl:    tadalafil (CIALIS) 20 MG tablet, Take 1 tablet (20 mg total) by mouth daily as needed for erectile dysfunction., Disp: 6 tablet, Rfl: 11    telmisartan (MICARDIS) 20 MG tablet, Take 20 mg by mouth daily., Disp: , Rfl:    testosterone cypionate (DEPOTESTOSTERONE CYPIONATE) 200 MG/ML injection, Inject 0.75  mL into the muscle every 14 days, Disp: 10 mL, Rfl: 0  Past Medical History: Past Medical History:  Diagnosis Date   Benign prostatic hypertrophy    Bladder spasm    Erectile dysfunction    Frequency    GERD (gastroesophageal reflux disease)    Hypertension    Hypogonadism in male    Kidney 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    Kidney, malrotation    MGUS (monoclonal gammopathy of unknown significance)    Monoclonal paraproteinemia    Obesity, unspecified    Other specified congenital anomaly of kidney    Other specified disorders of bladder    Other testicular hypofunction    Premature ejaculation    Sleep apnea    Ventral hernia     Tobacco Use: Social History   Tobacco Use  Smoking Status Every Day   Types: Cigars  Smokeless Tobacco Never    Labs: Recent Review Flowsheet Data     Labs for ITP Cardiac and Pulmonary Rehab Latest Ref Rng & Units 11/28/2015 12/01/2020 08/27/2021   Cholestrol 0 - 200 mg/dL 130 142 143   LDLCALC 0 - 99 mg/dL 74 90 82   HDL >40 mg/dL 25(L) 27(L) 28(L)   Trlycerides <150 mg/dL 157(H) 136 166(H)  Hemoglobin A1c 4.8 - 5.6 % - - 5.5        Exercise Target Goals: Exercise Program Goal: Individual exercise prescription set using results from initial 6 min walk test and THRR while considering  patient's activity barriers and safety.   Exercise Prescription Goal: Initial exercise prescription builds to 30-45 minutes a day of aerobic activity, 2-3 days per week.  Home exercise guidelines will be given to patient during program as part of exercise prescription that the participant will acknowledge.   Education: Aerobic Exercise: - Group verbal and visual presentation on the components of exercise prescription. Introduces F.I.T.T principle from ACSM for exercise prescriptions.  Reviews  F.I.T.T. principles of aerobic exercise including progression. Written material given at graduation.   Education: Resistance Exercise: - Group verbal and visual presentation on the components of exercise prescription. Introduces F.I.T.T principle from ACSM for exercise prescriptions  Reviews F.I.T.T. principles of resistance exercise including progression. Written material given at graduation.    Education: Exercise & Equipment Safety: - Individual verbal instruction and demonstration of equipment use and safety with use of the equipment. Flowsheet Row Cardiac Rehab from 10/01/2021 in [facility] Cardiac and Pulmonary Rehab  Date 10/01/21  Educator [facility]  Instruction Review Code 1- Verbalizes Understanding       Education: Exercise Physiology & General Exercise Guidelines: - Group verbal and written instruction with models to review the exercise physiology of the cardiovascular system and associated critical values. Provides general exercise guidelines with specific guidelines to those with heart or lung disease.    Education: Flexibility, Balance, Mind/Body Relaxation: - Group verbal and visual presentation with interactive activity on the components of exercise prescription. Introduces F.I.T.T principle from ACSM for exercise prescriptions. Reviews F.I.T.T. principles of flexibility and balance exercise training including progression. Also discusses the mind body connection.  Reviews various relaxation techniques to help reduce and manage stress (i.e. Deep breathing, progressive muscle relaxation, and visualization). Balance handout provided to take home. Written material given at graduation.   Activity Barriers & Risk Stratification:  Activity Barriers & Cardiac Risk Stratification - 09/21/21 1404       Activity Barriers & Cardiac Risk Stratification   Activity Barriers Left Hip Replacement;Back Problems;Neck/Spine Problems    Cardiac Risk Stratification Moderate             6 Minute  Walk:  6 Minute Walk     Row Name 10/01/21 1625         6 Minute Walk   Phase Initial     Distance 1555 feet     Walk Time 6 minutes     # of Rest Breaks 0     MPH 2.95     METS 3.84     RPE 9     VO2 Peak 13.44     Symptoms No     Resting HR 61 bpm     Resting BP 138/82     Resting Oxygen Saturation  96 %     Exercise Oxygen Saturation  during 6 min walk 98 %     Max Ex. HR 92 bpm     Max Ex. BP 150/80     2 Minute Post BP 126/72              Oxygen Initial Assessment:   Oxygen Re-Evaluation:   Oxygen Discharge (Final Oxygen Re-Evaluation):   Initial Exercise Prescription:  Initial Exercise Prescription - 10/01/21 1600       Date of Initial Exercise RX and Referring  Provider   Date 10/01/21    Referring Provider Arida      Oxygen   Maintain Oxygen Saturation 88% or higher      Treadmill   MPH 2.7    Grade 0.5    Minutes 15    METs 3.25      Recumbant Bike   Level 2    RPM 60    Watts 30    Minutes 15    METs 3.8      Elliptical   Level 1    Speed 3    Minutes 15      REL-XR   Level 3    Speed 50    Minutes 15    METs 3.8      Prescription Details   Frequency (times per week) 2    Duration Progress to 30 minutes of continuous aerobic without signs/symptoms of physical distress      Intensity   THRR 40-80% of Max Heartrate 102-143    Ratings of Perceived Exertion 11-13    Perceived Dyspnea 0-4      Progression   Progression Continue to progress workloads to maintain intensity without signs/symptoms of physical distress.      Resistance Training   Training Prescription Yes    Weight 4    Reps 10-15             Perform Capillary Blood Glucose checks as needed.  Exercise Prescription Changes:   Exercise Prescription Changes     Row Name 10/01/21 1600             Response to Exercise   Blood Pressure (Admit) 138/82       Blood Pressure (Exercise) 150/80       Blood Pressure (Exit) 126/72       Heart Rate  (Admit) 61 bpm       Heart Rate (Exercise) 92 bpm       Heart Rate (Exit) 69 bpm       Oxygen Saturation (Admit) 96 %       Oxygen Saturation (Exercise) 98 %       Rating of Perceived Exertion (Exercise) 9       Symptoms none       Comments walk test results                Exercise Comments:   Exercise Goals and Review:   Exercise Goals     Row Name 10/01/21 1633             Exercise Goals   Increase Physical Activity Yes       Intervention Provide advice, education, support and counseling about physical activity/exercise needs.;Develop an individualized exercise prescription for aerobic and resistive training based on initial evaluation findings, risk stratification, comorbidities and participant's personal goals.       Expected Outcomes Short Term: Attend rehab on a regular basis to increase amount of physical activity.;Long Term: Add in home exercise to make exercise part of routine and to increase amount of physical activity.;Long Term: Exercising regularly at least 3-5 days a week.       Increase Strength and Stamina Yes       Intervention Provide advice, education, support and counseling about physical activity/exercise needs.;Develop an individualized exercise prescription for aerobic and resistive training based on initial evaluation findings, risk stratification, comorbidities and participant's personal goals.       Expected Outcomes Short Term: Increase workloads from initial exercise prescription for resistance, speed,  and METs.;Short Term: Perform resistance training exercises routinely during rehab and add in resistance training at home;Long Term: Improve cardiorespiratory fitness, muscular endurance and strength as measured by increased METs and functional capacity (6MWT)       Able to understand and use rate of perceived exertion (RPE) scale Yes       Intervention Provide education and explanation on how to use RPE scale       Expected Outcomes Short Term: Able to  use RPE daily in rehab to express subjective intensity level;Long Term:  Able to use RPE to guide intensity level when exercising independently       Able to understand and use Dyspnea scale Yes       Intervention Provide education and explanation on how to use Dyspnea scale       Expected Outcomes Short Term: Able to use Dyspnea scale daily in rehab to express subjective sense of shortness of breath during exertion;Long Term: Able to use Dyspnea scale to guide intensity level when exercising independently       Knowledge and understanding of Target Heart Rate Range (THRR) Yes       Intervention Provide education and explanation of THRR including how the numbers were predicted and where they are located for reference       Expected Outcomes Short Term: Able to state/look up THRR;Long Term: Able to use THRR to govern intensity when exercising independently;Short Term: Able to use daily as guideline for intensity in rehab       Able to check pulse independently Yes       Intervention Provide education and demonstration on how to check pulse in carotid and radial arteries.;Review the importance of being able to check your own pulse for safety during independent exercise       Expected Outcomes Short Term: Able to explain why pulse checking is important during independent exercise;Long Term: Able to check pulse independently and accurately       Understanding of Exercise Prescription Yes       Intervention Provide education, explanation, and written materials on patient's individual exercise prescription       Expected Outcomes Short Term: Able to explain program exercise prescription;Long Term: Able to explain home exercise prescription to exercise independently                Exercise Goals Re-Evaluation :   Discharge Exercise Prescription (Final Exercise Prescription Changes):  Exercise Prescription Changes - 10/01/21 1600       Response to Exercise   Blood Pressure (Admit) 138/82     Blood Pressure (Exercise) 150/80    Blood Pressure (Exit) 126/72    Heart Rate (Admit) 61 bpm    Heart Rate (Exercise) 92 bpm    Heart Rate (Exit) 69 bpm    Oxygen Saturation (Admit) 96 %    Oxygen Saturation (Exercise) 98 %    Rating of Perceived Exertion (Exercise) 9    Symptoms none    Comments walk test results             Nutrition:  Target Goals: Understanding of nutrition guidelines, daily intake of sodium 1500mg , cholesterol 200mg , calories 30% from fat and 7% or less from saturated fats, daily to have 5 or more servings of fruits and vegetables.  Education: All About Nutrition: -Group instruction provided by verbal, written material, interactive activities, discussions, models, and posters to present general guidelines for heart healthy nutrition including fat, fiber, MyPlate, the role of sodium in heart healthy  nutrition, utilization of the nutrition label, and utilization of this knowledge for meal planning. Follow up email sent as well. Written material given at graduation. Flowsheet Row Cardiac Rehab from 10/01/2021 in [facility] Cardiac and Pulmonary Rehab  Education need identified 10/01/21       Biometrics:  Pre Biometrics - 10/01/21 1634       Pre Biometrics   Height 5' 10.75" (1.797 m)    Weight 237 lb 8 oz (107.7 kg)    BMI (Calculated) 33.36    Single Leg Stand 30 seconds              Nutrition Therapy Plan and Nutrition Goals:  Nutrition Therapy & Goals - 10/01/21 1641       Intervention Plan   Intervention Prescribe, educate and counsel regarding individualized specific dietary modifications aiming towards targeted core components such as weight, hypertension, lipid management, diabetes, heart failure and other comorbidities.    Expected Outcomes Short Term Goal: Understand basic principles of dietary content, such as calories, fat, sodium, cholesterol and nutrients.;Short Term Goal: A plan has been developed with personal nutrition goals set  during dietitian appointment.;Long Term Goal: Adherence to prescribed nutrition plan.             Nutrition Assessments:  MEDIFICTS Score Key: ?70 Need to make dietary changes  40-70 Heart Healthy Diet ? 40 Therapeutic Level Cholesterol Diet  Flowsheet Row Cardiac Rehab from 10/01/2021 in [facility] Cardiac and Pulmonary Rehab  Picture Your Plate Total Score on Admission 62      Picture Your Plate Scores: <53 Unhealthy dietary pattern with much room for improvement. 41-50 Dietary pattern unlikely to meet recommendations for good health and room for improvement. 51-60 More healthful dietary pattern, with some room for improvement.  >60 Healthy dietary pattern, although there may be some specific behaviors that could be improved.    Nutrition Goals Re-Evaluation:   Nutrition Goals Discharge (Final Nutrition Goals Re-Evaluation):   Psychosocial: Target Goals: Acknowledge presence or absence of significant depression and/or stress, maximize coping skills, provide positive support system. Participant is able to verbalize types and ability to use techniques and skills needed for reducing stress and depression.   Education: Stress, Anxiety, and Depression - Group verbal and visual presentation to define topics covered.  Reviews how body is impacted by stress, anxiety, and depression.  Also discusses healthy ways to reduce stress and to treat/manage anxiety and depression.  Written material given at graduation.   Education: Sleep Hygiene -Provides group verbal and written instruction about how sleep can affect your health.  Define sleep hygiene, discuss sleep cycles and impact of sleep habits. Review good sleep hygiene tips.    Initial Review & Psychosocial Screening:  Initial Psych Review & Screening - 09/21/21 1406       Initial Review   Current issues with Current Anxiety/Panic      Family Dynamics   Good Support System? Yes   wife     Barriers   Psychosocial barriers to  participate in program There are no identifiable barriers or psychosocial needs.;The patient should benefit from training in stress management and relaxation.      Screening Interventions   Interventions Encouraged to exercise;Provide feedback about the scores to participant;To provide support and resources with identified psychosocial needs    Expected Outcomes Short Term goal: Utilizing psychosocial counselor, staff and physician to assist with identification of specific Stressors or current issues interfering with healing process. Setting desired goal for each stressor or current issue  identified.;Long Term Goal: Stressors or current issues are controlled or eliminated.;Short Term goal: Identification and review with participant of any Quality of Life or Depression concerns found by scoring the questionnaire.;Long Term goal: The participant improves quality of Life and PHQ9 Scores as seen by post scores and/or verbalization of changes             Quality of Life Scores:   Quality of Life - 10/01/21 1639       Quality of Life   Select Quality of Life      Quality of Life Scores   Health/Function Pre 18.17 %    Socioeconomic Pre 18.13 %    Psych/Spiritual Pre 20.36 %    Family Pre 21 %    GLOBAL Pre 19 %            Scores of 19 and below usually indicate a poorer quality of life in these areas.  A difference of  2-3 points is a clinically meaningful difference.  A difference of 2-3 points in the total score of the Quality of Life Index has been associated with significant improvement in overall quality of life, self-image, physical symptoms, and general health in studies assessing change in quality of life.  PHQ-9: Recent Review Flowsheet Data     Depression screen [facility] 2/9 10/01/2021   Decreased Interest 0   Down, Depressed, Hopeless 1   PHQ - 2 Score 1   Altered sleeping 2   Tired, decreased energy 1   Change in appetite 2   Feeling bad or failure about yourself  2    Trouble concentrating 0   Moving slowly or fidgety/restless 0   Suicidal thoughts 0   PHQ-9 Score 8   Difficult doing work/chores Not difficult at all      Interpretation of Total Score  Total Score Depression Severity:  1-4 = Minimal depression, 5-9 = Mild depression, 10-14 = Moderate depression, 15-19 = Moderately severe depression, 20-27 = Severe depression   Psychosocial Evaluation and Intervention:  Psychosocial Evaluation - 09/21/21 1417       Psychosocial Evaluation & Interventions   Interventions Encouraged to exercise with the program and follow exercise prescription    Comments Jazmine reports feeling well after his STEMI. He is easing back into work and not overdoing it. He has had a kidney transplant in the past which causes no issues. He does report some anxiety related to his cpap and claustrophobia, so he takes medicine at night prior to placing mask. He is trying to get fitted for a new cpap so he is scheduling a sleep study. His wife is his main support system and they both are looking forward to the education aspect of the program    Expected Outcomes Short: attend cardiac rehab for education and exercise. Long: develop and maintain positive self care habits.    Continue Psychosocial Services  Follow up required by staff             Psychosocial Re-Evaluation:   Psychosocial Discharge (Final Psychosocial Re-Evaluation):   Vocational Rehabilitation: Provide vocational rehab assistance to qualifying candidates.   Vocational Rehab Evaluation & Intervention:  Vocational Rehab - 09/21/21 1406       Initial Vocational Rehab Evaluation & Intervention   Assessment shows need for Vocational Rehabilitation No             Education: Education Goals: Education classes will be provided on a variety of topics geared toward better understanding of heart health and risk  factor modification. Participant will state understanding/return demonstration of topics  presented as noted by education test scores.  Learning Barriers/Preferences:  Learning Barriers/Preferences - 09/21/21 1406       Learning Barriers/Preferences   Learning Barriers None    Learning Preferences None             General Cardiac Education Topics:  AED/CPR: - Group verbal and written instruction with the use of models to demonstrate the basic use of the AED with the basic ABC's of resuscitation.   Anatomy and Cardiac Procedures: - Group verbal and visual presentation and models provide information about basic cardiac anatomy and function. Reviews the testing methods done to diagnose heart disease and the outcomes of the test results. Describes the treatment choices: Medical Management, Angioplasty, or Coronary Bypass Surgery for treating various heart conditions including Myocardial Infarction, Angina, Valve Disease, and Cardiac Arrhythmias.  Written material given at graduation. Flowsheet Row Cardiac Rehab from 10/01/2021 in [facility] Cardiac and Pulmonary Rehab  Education need identified 10/01/21       Medication Safety: - Group verbal and visual instruction to review commonly prescribed medications for heart and lung disease. Reviews the medication, class of the drug, and side effects. Includes the steps to properly store meds and maintain the prescription regimen.  Written material given at graduation.   Intimacy: - Group verbal instruction through game format to discuss how heart and lung disease can affect sexual intimacy. Written material given at graduation..   Know Your Numbers and Heart Failure: - Group verbal and visual instruction to discuss disease risk factors for cardiac and pulmonary disease and treatment options.  Reviews associated critical values for Overweight/Obesity, Hypertension, Cholesterol, and Diabetes.  Discusses basics of heart failure: signs/symptoms and treatments.  Introduces Heart Failure Zone chart for action plan for heart failure.   Written material given at graduation.   Infection Prevention: - Provides verbal and written material to individual with discussion of infection control including proper hand washing and proper equipment cleaning during exercise session. Flowsheet Row Cardiac Rehab from 10/01/2021 in [facility] Cardiac and Pulmonary Rehab  Date 10/01/21  Educator [facility]  Instruction Review Code 1- Verbalizes Understanding       Falls Prevention: - Provides verbal and written material to individual with discussion of falls prevention and safety. Flowsheet Row Cardiac Rehab from 10/01/2021 in [facility] Cardiac and Pulmonary Rehab  Date 10/01/21  Educator [facility]  Instruction Review Code 1- Verbalizes Understanding       Other: -Provides group and verbal instruction on various topics (see comments)   Knowledge Questionnaire Score:  Knowledge Questionnaire Score - 10/01/21 1639       Knowledge Questionnaire Score   Pre Score 24/26             Core Components/Risk Factors/Patient Goals at Admission:  Personal Goals and Risk Factors at Admission - 10/01/21 1636       Core Components/Risk Factors/Patient Goals on Admission    Weight Management Yes    Intervention Weight Management: Develop a combined nutrition and exercise program designed to reach desired caloric intake, while maintaining appropriate intake of nutrient and fiber, sodium and fats, and appropriate energy expenditure required for the weight goal.;Weight Management: Provide education and appropriate resources to help participant work on and attain dietary goals.;Obesity: Provide education and appropriate resources to help participant work on and attain dietary goals.    Admit Weight 237 lb 8 oz (107.7 kg)    Goal Weight: Short Term 232 lb (105.2 kg)  Goal Weight: Long Term 220 lb (99.8 kg)    Expected Outcomes Weight Loss: Understanding of general recommendations for a balanced deficit meal plan, which promotes 1-2 lb weight loss per week and  includes a negative energy balance of [phone] kcal/d;Long Term: Adherence to nutrition and physical activity/exercise program aimed toward attainment of established weight goal;Short Term: Continue to assess and modify interventions until short term weight is achieved;Understanding recommendations for meals to include 15-35% energy as protein, 25-35% energy from fat, 35-60% energy from carbohydrates, less than 200mg  of dietary cholesterol, 20-35 gm of total fiber daily;Understanding of distribution of calorie intake throughout the day with the consumption of 4-5 meals/snacks    Tobacco Cessation Yes   recently quit within that last 6 months   Number of packs per day 0    Expected Outcomes Long Term: Complete abstinence from all tobacco products for at least 12 months from quit date.    Hypertension Yes    Intervention Provide education on lifestyle modifcations including regular physical activity/exercise, weight management, moderate sodium restriction and increased consumption of fresh fruit, vegetables, and low fat dairy, alcohol moderation, and smoking cessation.;Monitor prescription use compliance.    Expected Outcomes Short Term: Continued assessment and intervention until BP is < 140/100mm HG in hypertensive participants. < 130/21mm HG in hypertensive participants with diabetes, heart failure or chronic kidney disease.;Long Term: Maintenance of blood pressure at goal levels.    Lipids Yes    Intervention Provide education and support for participant on nutrition & aerobic/resistive exercise along with prescribed medications to achieve LDL 70mg , HDL >40mg .    Expected Outcomes Short Term: Participant states understanding of desired cholesterol values and is compliant with medications prescribed. Participant is following exercise prescription and nutrition guidelines.;Long Term: Cholesterol controlled with medications as prescribed, with individualized exercise RX and with personalized nutrition plan.  Value goals: LDL < 70mg , HDL > 40 mg.             Education:Diabetes - Individual verbal and written instruction to review signs/symptoms of diabetes, desired ranges of glucose level fasting, after meals and with exercise. Acknowledge that pre and post exercise glucose checks will be done for 3 sessions at entry of program.   Core Components/Risk Factors/Patient Goals Review:    Core Components/Risk Factors/Patient Goals at Discharge (Final Review):    ITP Comments:  ITP Comments     Row Name 09/21/21 1416 10/01/21 1624         ITP Comments Initial telephone orientation completed. Diagnosis can be found in [facility] 9/5. EP orientation scheduled for Monday 10/10 at 3pm. Completed 6MWT and gym orientation. Initial ITP created and sent for review to Dr. Emily Filbert, Medical Director.               Comments: initial ITP

## 2021-10-01 NOTE — Patient Instructions (Signed)
Patient Instructions  Patient Details  Name: Chad Mccann MRN: [id number] Date of Birth: Jun 10, 1966 Referring Provider:  Wellington Hampshire, MD  Below are your personal goals for exercise, nutrition, and risk factors. Our goal is to help you stay on track towards obtaining and maintaining these goals. We will be discussing your progress on these goals with you throughout the program.  Initial Exercise Prescription:  Initial Exercise Prescription - 10/01/21 1600       Date of Initial Exercise RX and Referring Provider   Date 10/01/21    Referring Provider Arida      Oxygen   Maintain Oxygen Saturation 88% or higher      Treadmill   MPH 2.7    Grade 0.5    Minutes 15    METs 3.25      Recumbant Bike   Level 2    RPM 60    Watts 30    Minutes 15    METs 3.8      Elliptical   Level 1    Speed 3    Minutes 15      REL-XR   Level 3    Speed 50    Minutes 15    METs 3.8      Prescription Details   Frequency (times per week) 2    Duration Progress to 30 minutes of continuous aerobic without signs/symptoms of physical distress      Intensity   THRR 40-80% of Max Heartrate 102-143    Ratings of Perceived Exertion 11-13    Perceived Dyspnea 0-4      Progression   Progression Continue to progress workloads to maintain intensity without signs/symptoms of physical distress.      Resistance Training   Training Prescription Yes    Weight 4    Reps 10-15             Exercise Goals: Frequency: Be able to perform aerobic exercise two to three times per week in program working toward 2-5 days per week of home exercise.  Intensity: Work with a perceived exertion of 11 (fairly light) - 15 (hard) while following your exercise prescription.  We will make changes to your prescription with you as you progress through the program.   Duration: Be able to do 30 to 45 minutes of continuous aerobic exercise in addition to a 5 minute warm-up and a 5 minute cool-down  routine.   Nutrition Goals: Your personal nutrition goals will be established when you do your nutrition analysis with the dietician.  The following are general nutrition guidelines to follow: Cholesterol < 200mg /day Sodium < 1500mg /day Fiber: Men over 50 yrs - 30 grams per day  Personal Goals:  Personal Goals and Risk Factors at Admission - 10/01/21 1636       Core Components/Risk Factors/Patient Goals on Admission    Weight Management Yes    Intervention Weight Management: Develop a combined nutrition and exercise program designed to reach desired caloric intake, while maintaining appropriate intake of nutrient and fiber, sodium and fats, and appropriate energy expenditure required for the weight goal.;Weight Management: Provide education and appropriate resources to help participant work on and attain dietary goals.;Obesity: Provide education and appropriate resources to help participant work on and attain dietary goals.    Admit Weight 237 lb 8 oz (107.7 kg)    Goal Weight: Short Term 232 lb (105.2 kg)    Goal Weight: Long Term 220 lb (99.8 kg)    Expected Outcomes  Weight Loss: Understanding of general recommendations for a balanced deficit meal plan, which promotes 1-2 lb weight loss per week and includes a negative energy balance of [phone] kcal/d;Long Term: Adherence to nutrition and physical activity/exercise program aimed toward attainment of established weight goal;Short Term: Continue to assess and modify interventions until short term weight is achieved;Understanding recommendations for meals to include 15-35% energy as protein, 25-35% energy from fat, 35-60% energy from carbohydrates, less than 200mg  of dietary cholesterol, 20-35 gm of total fiber daily;Understanding of distribution of calorie intake throughout the day with the consumption of 4-5 meals/snacks    Tobacco Cessation Yes   recently quit within that last 6 months   Number of packs per day 0    Expected Outcomes Long  Term: Complete abstinence from all tobacco products for at least 12 months from quit date.    Hypertension Yes    Intervention Provide education on lifestyle modifcations including regular physical activity/exercise, weight management, moderate sodium restriction and increased consumption of fresh fruit, vegetables, and low fat dairy, alcohol moderation, and smoking cessation.;Monitor prescription use compliance.    Expected Outcomes Short Term: Continued assessment and intervention until BP is < 140/107mm HG in hypertensive participants. < 130/43mm HG in hypertensive participants with diabetes, heart failure or chronic kidney disease.;Long Term: Maintenance of blood pressure at goal levels.    Lipids Yes    Intervention Provide education and support for participant on nutrition & aerobic/resistive exercise along with prescribed medications to achieve LDL 70mg , HDL >40mg .    Expected Outcomes Short Term: Participant states understanding of desired cholesterol values and is compliant with medications prescribed. Participant is following exercise prescription and nutrition guidelines.;Long Term: Cholesterol controlled with medications as prescribed, with individualized exercise RX and with personalized nutrition plan. Value goals: LDL < 70mg , HDL > 40 mg.             Tobacco Use Initial Evaluation: Social History   Tobacco Use  Smoking Status Every Day   Types: Cigars  Smokeless Tobacco Never    Exercise Goals and Review:  Exercise Goals     Row Name 10/01/21 1633             Exercise Goals   Increase Physical Activity Yes       Intervention Provide advice, education, support and counseling about physical activity/exercise needs.;Develop an individualized exercise prescription for aerobic and resistive training based on initial evaluation findings, risk stratification, comorbidities and participant's personal goals.       Expected Outcomes Short Term: Attend rehab on a regular basis  to increase amount of physical activity.;Long Term: Add in home exercise to make exercise part of routine and to increase amount of physical activity.;Long Term: Exercising regularly at least 3-5 days a week.       Increase Strength and Stamina Yes       Intervention Provide advice, education, support and counseling about physical activity/exercise needs.;Develop an individualized exercise prescription for aerobic and resistive training based on initial evaluation findings, risk stratification, comorbidities and participant's personal goals.       Expected Outcomes Short Term: Increase workloads from initial exercise prescription for resistance, speed, and METs.;Short Term: Perform resistance training exercises routinely during rehab and add in resistance training at home;Long Term: Improve cardiorespiratory fitness, muscular endurance and strength as measured by increased METs and functional capacity (6MWT)       Able to understand and use rate of perceived exertion (RPE) scale Yes       Intervention  Provide education and explanation on how to use RPE scale       Expected Outcomes Short Term: Able to use RPE daily in rehab to express subjective intensity level;Long Term:  Able to use RPE to guide intensity level when exercising independently       Able to understand and use Dyspnea scale Yes       Intervention Provide education and explanation on how to use Dyspnea scale       Expected Outcomes Short Term: Able to use Dyspnea scale daily in rehab to express subjective sense of shortness of breath during exertion;Long Term: Able to use Dyspnea scale to guide intensity level when exercising independently       Knowledge and understanding of Target Heart Rate Range (THRR) Yes       Intervention Provide education and explanation of THRR including how the numbers were predicted and where they are located for reference       Expected Outcomes Short Term: Able to state/look up THRR;Long Term: Able to use THRR  to govern intensity when exercising independently;Short Term: Able to use daily as guideline for intensity in rehab       Able to check pulse independently Yes       Intervention Provide education and demonstration on how to check pulse in carotid and radial arteries.;Review the importance of being able to check your own pulse for safety during independent exercise       Expected Outcomes Short Term: Able to explain why pulse checking is important during independent exercise;Long Term: Able to check pulse independently and accurately       Understanding of Exercise Prescription Yes       Intervention Provide education, explanation, and written materials on patient's individual exercise prescription       Expected Outcomes Short Term: Able to explain program exercise prescription;Long Term: Able to explain home exercise prescription to exercise independently                Copy of goals given to participant.

## 2021-10-08 ENCOUNTER — Other Ambulatory Visit: Payer: Self-pay

## 2021-10-08 DIAGNOSIS — I213 ST elevation (STEMI) myocardial infarction of unspecified site: Secondary | ICD-10-CM

## 2021-10-08 DIAGNOSIS — Z955 Presence of coronary angioplasty implant and graft: Secondary | ICD-10-CM | POA: Diagnosis not present

## 2021-10-08 DIAGNOSIS — I252 Old myocardial infarction: Secondary | ICD-10-CM | POA: Diagnosis not present

## 2021-10-08 DIAGNOSIS — Z48812 Encounter for surgical aftercare following surgery on the circulatory system: Secondary | ICD-10-CM | POA: Diagnosis not present

## 2021-10-08 NOTE — Progress Notes (Signed)
Daily Session Note  Patient Details  Name: Chad Mccann MRN: [id number] Date of Birth: Jan 14, 1966 Referring Provider:   Flowsheet Row Cardiac Rehab from 10/01/2021 in [facility]  Referring Provider Arida       Encounter Date: 10/08/2021  Check In:  Session Check In - 10/08/21 1618       Check-In   Supervising physician immediately available to respond to emergencies See telemetry face sheet for immediately available ER MD    Location [facility]-Cardiac & Pulmonary Rehab    Staff Present Birdie Sons, MPA, Nino Glow, MS, ASCM CEP, Exercise Physiologist;Joseph Furley, [place], MA, RCEP, CCRP, CCET    Virtual Visit No    Medication changes reported     No    Fall or balance concerns reported    No    Tobacco Cessation No Change    Warm-up and Cool-down Performed on first and last piece of equipment    Resistance Training Performed Yes    VAD Patient? No    PAD/SET Patient? No      Pain Assessment   Currently in Pain? No/denies                Social History   Tobacco Use  Smoking Status Every Day   Types: Cigars  Smokeless Tobacco Never    Goals Met:  Independence with exercise equipment Exercise tolerated well No report of concerns or symptoms today Strength training completed today  Goals Unmet:  Not Applicable  Comments: First full day of exercise!  Patient was oriented to gym and equipment including functions, settings, policies, and procedures.  Patient's individual exercise prescription and treatment plan were reviewed.  All starting workloads were established based on the results of the 6 minute walk test done at initial orientation visit.  The plan for exercise progression was also introduced and progression will be customized based on patient's performance and goals.     Dr. Emily Filbert is Medical Director for [place].  Dr. Ottie Glazier is Medical Director for [facility]  Pulmonary Rehabilitation.

## 2021-10-08 NOTE — Progress Notes (Signed)
Cardiology Office Note:    Date:  10/09/2021   ID:  Chad Mccann, DOB 1966/10/29, MRN [id number]  PCP:  [name], MD  [facility] Cardiologist:  Dr. Darlis Loan [facility] Electrophysiologist:  None   Referring MD: [name], MD   Chief Complaint: 1 month follow-up  History of Present Illness:    Chad Mccann is a 55 y.o. 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    Patient was admitted earlier this month for anterior STEMI s/p DES to mLAD. Echo showed LVEF 55-60%, G2DD, trivial MR. Insurance would not cover Brilinta. He was transitioned to Effient. He was started on Aspirin, coreg, Crestor.   Last seen 09/10/21 and was dong well from a cardiac perspective. He was referred to cardiac rehab and pulmonology for OSA testing.   Today, the patient reports he has stated cardiac rehab. This is going well. He denies anginal symptoms. Hs not seen pulmonology yet. No other changes since the last visit. Vitals are good. Has not lost any more weight. HE is at 85% activity level. He is back working, half the time at a desk. This is going well. No bleeding issues with DAPT.    Past Medical History:  Diagnosis Date   Benign prostatic hypertrophy    Bladder spasm    Erectile dysfunction    Frequency    GERD (gastroesophageal reflux disease)    Hypertension    Hypogonadism in male    Kidney 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    Kidney, malrotation    MGUS (monoclonal gammopathy of unknown significance)    Monoclonal paraproteinemia    Obesity, unspecified    Other specified congenital anomaly of kidney    Other specified disorders of bladder    Other testicular hypofunction    Premature ejaculation    Sleep apnea    Ventral hernia     Past Surgical History:  Procedure Laterality Date   BACK SURGERY     c5/6 fusion after discectomy 3 weeks prior   COLONOSCOPY     polyp removal   CORONARY/GRAFT ACUTE MI  REVASCULARIZATION N/A 08/27/2021   Procedure: Coronary/Graft Acute MI Revascularization;  Surgeon: Wellington Hampshire, MD;  Location: [facility];  Service: Cardiovascular;  Laterality: N/A;   dialysis port placement     and removal   HERNIA REPAIR  12/29/13   ventral   KIDNEY TRANSPLANT  01/2012   LEFT HEART CATH AND CORONARY ANGIOGRAPHY N/A 08/27/2021   Procedure: LEFT HEART CATH AND CORONARY ANGIOGRAPHY;  Surgeon: Wellington Hampshire, MD;  Location: [facility];  Service: Cardiovascular;  Laterality: N/A;   SPINE SURGERY     cervical fusion   TOTAL HIP ARTHROPLASTY Left 03/02/2019   Procedure: TOTAL HIP ARTHROPLASTY ANTERIOR APPROACH-LEFT;  Surgeon: Hessie Knows, MD;  Location: [facility] OR[facility];  Service: Orthopedics;  Laterality: Left;    Current Medications: Current Meds  Medication Sig   acetaminophen (TYLENOL) 325 MG tablet Take 1-2 tablets (325-650 mg total) by mouth every 6 (six) hours as needed for mild pain (pain score 1-3 or temp > 100.5).   aspirin 81 MG chewable tablet Chew 1 tablet (81 mg total) by mouth daily.   carvedilol (COREG) 6.25 MG tablet Take 1 tablet (6.25 mg total) by mouth 2 (two) times daily with a meal.   clonazePAM (KLONOPIN) 0.5 MG tablet Take 0.5 mg by mouth at bedtime.   mycophenolate (CELLCEPT)  250 MG capsule Take 750 mg by mouth 2 (two) times daily.   nitroGLYCERIN (NITROSTAT) 0.4 MG SL tablet Place 1 tablet (0.4 mg total) under the tongue every 5 (five) minutes as needed for chest pain.   prasugrel (EFFIENT) 10 MG TABS tablet Take 1 tablet (10 mg total) by mouth daily.   rosuvastatin (CRESTOR) 40 MG tablet Take 1 tablet (40 mg total) by mouth daily.   tacrolimus (PROGRAF) 1 MG capsule Take 2 mg by mouth 2 (two) times daily.   tadalafil (CIALIS) 20 MG tablet Take 1 tablet (20 mg total) by mouth daily as needed for erectile dysfunction.   telmisartan (MICARDIS) 20 MG tablet Take 20 mg by mouth daily.   testosterone cypionate (DEPOTESTOSTERONE CYPIONATE)  200 MG/ML injection Inject 0.75  mL into the muscle every 14 days     Allergies:   Hydralazine, Morphine and related, Prednisone, and Shellfish allergy   Social History   Socioeconomic History   Marital status: Married    Spouse name: Not on file   Number of children: Not on file   Years of education: Not on file   Highest education level: Not on file  Occupational History   Occupation: Hotel manager  Tobacco Use   Smoking status: Former    Types: Cigars    Quit date: 08/27/2021    Years since quitting: 0.1   Smokeless tobacco: Never  Substance and Sexual Activity   Alcohol use: Yes    Alcohol/week: 0.0 standard drinks    Comment: occ   Drug use: No   Sexual activity: Yes  Other Topics Concern   Not on file  Social History Narrative   Not on file   Social Determinants of Health   Financial Resource Strain: Not on file  Food Insecurity: Not on file  Transportation Needs: Not on file  Physical Activity: Not on file  Stress: Not on file  Social Connections: Not on file     Family History: The patient's family history includes Colon cancer in his maternal grandfather; Colon cancer (age of onset: 61) in his father; Heart attack in his father; Stroke in his brother. There is no history of Kidney disease, Prostate cancer, Kidney cancer, or Bladder Cancer.  ROS:   Please see the history of present illness.     All other systems reviewed and are negative.  EKGs/Labs/Other Studies Reviewed:    The following studies were reviewed today:  Echo 08/2021  1. Left ventricular ejection fraction, by estimation, is 55 to 60%. The  left ventricle has normal function. The left ventricle has no regional  wall motion abnormalities. Left ventricular diastolic parameters are  consistent with Grade II diastolic  dysfunction (pseudonormalization).   2. Right ventricular systolic function is normal. The right ventricular  size is normal.   3. The mitral valve is normal in  structure. Trivial mitral valve  regurgitation. No evidence of mitral stenosis.   4. The aortic valve is tricuspid. Aortic valve regurgitation is not  visualized. No aortic stenosis is present.   5. The inferior vena cava is normal in size with greater than 50%  respiratory variability, suggesting right atrial pressure of 3 mmHg.    Cardiac cath 08/2021   Mid LM to Dist LM lesion is 30% stenosed.   Mid LAD lesion is 99% stenosed.   Ost LAD lesion is 30% stenosed.   Prox RCA lesion is 30% stenosed.   Prox RCA to Mid RCA lesion is 40% stenosed.  RPDA lesion is 30% stenosed.   A drug-eluting stent was successfully placed using a STENT ONYX FRONTIER 3.0X22.   Post intervention, there is a 0% residual stenosis.   1.  Severe one-vessel coronary artery disease with 99% thrombotic stenosis in the mid LAD which is the culprit for anterior STEMI.  Mild to moderate left main and RCA disease. 2.  Left ventricular angiography was not performed due to chronic kidney disease.  LVEDP was significantly elevated at 32 mmHg. 3.  Successful angioplasty and drug-eluting stent placement to the mid LAD.   Recommendations: Dual antiplatelet therapy for at least 12 months. Aggressive treatment of risk factors..  I switched atorvastatin to high-dose rosuvastatin. I switched metoprolol to carvedilol.  An ACE inhibitor or ARB can be resumed once renal function is stable. I requested an echocardiogram. Gentle hydration for renal protection.   Coronary Diagrams   Diagnostic Dominance: Right Intervention      EKG:  EKG is not ordered today.    Recent Labs: 08/28/2021: ALT 14 09/10/2021: BUN 21; Creatinine, Ser 1.22; Platelets 243; Potassium 4.9; Sodium 137 09/13/2021: Hemoglobin 16.0  Recent Lipid Panel    Component Value Date/Time   CHOL 143 08/27/2021 1243   CHOL 142 12/01/2020 1353   TRIG 166 (H) 08/27/2021 1243   HDL 28 (L) 08/27/2021 1243   HDL 27 (L) 12/01/2020 1353   CHOLHDL 5.1 08/27/2021 1243    VLDL 33 08/27/2021 1243   LDLCALC 82 08/27/2021 1243   LDLCALC 90 12/01/2020 1353     Physical Exam:    VS:  BP 120/80 (BP Location: Left Arm, Patient Position: Sitting, Cuff Size: Normal)   Pulse 70   Ht 5' 10.75" (1.797 m)   Wt 239 lb (108.4 kg)   SpO2 98%   BMI 33.57 kg/m     Wt Readings from Last 3 Encounters:  10/09/21 239 lb (108.4 kg)  10/01/21 237 lb 8 oz (107.7 kg)  09/10/21 238 lb 2 oz (108 kg)     GEN:  Well nourished, well developed in no acute distress HEENT: Normal NECK: No JVD; No carotid bruits LYMPHATICS: No lymphadenopathy CARDIAC: RRR, no murmurs, rubs, gallops RESPIRATORY:  Clear to auscultation without rales, wheezing or rhonchi  ABDOMEN: Soft, non-tender, non-distended MUSCULOSKELETAL:  No edema; No deformity  SKIN: Warm and dry NEUROLOGIC:  Alert and oriented x 3 PSYCHIATRIC:  Normal affect   ASSESSMENT:    1. Coronary artery disease involving native coronary artery of native heart with angina pectoris ([place])   2. Essential hypertension   3. Hyperlipidemia, mixed   4. History of renal transplant   5. Obstructive sleep apnea syndrome    PLAN:    In order of problems listed above:  CAD with STEMI s/p DES to mLAD 08/2021 Patient denies anginal symptoms. He is dong cardiac rehab and progressing well, slowly getting back to baseline. He is back at work, no issues. Has not lost any more weight since the last visit. Denies bleeding issues with DAPT. Continue Aspirin, Effient, Coreg, statin, SL NTG PRN.   HLD LDL 82. Re-check Lipid panel and LFTs. Continue Crestor 40mg  daily.   HTN BP good today. Continue current regiment  H/o renal transplant Follow-up labs were unremarkable.  OSA He is waiting to see pulmonology.   Disposition: Follow up in 3 month(s) with MD/APP    Signed, Chad Lehenbauer Ninfa Meeker, PA-C  10/09/2021 10:46 AM    [place]

## 2021-10-09 ENCOUNTER — Other Ambulatory Visit: Payer: Self-pay

## 2021-10-09 ENCOUNTER — Ambulatory Visit (INDEPENDENT_AMBULATORY_CARE_PROVIDER_SITE_OTHER): Payer: BC Managed Care – PPO | Admitting: Medical

## 2021-10-09 ENCOUNTER — Encounter: Payer: Self-pay | Admitting: Medical

## 2021-10-09 VITALS — BP 120/80 | HR 70 | Ht 70.75 in | Wt 239.0 lb

## 2021-10-09 DIAGNOSIS — Z940 Kidney transplant status: Secondary | ICD-10-CM | POA: Diagnosis not present

## 2021-10-09 DIAGNOSIS — E782 Mixed hyperlipidemia: Secondary | ICD-10-CM | POA: Diagnosis not present

## 2021-10-09 DIAGNOSIS — I25119 Atherosclerotic heart disease of native coronary artery with unspecified angina pectoris: Secondary | ICD-10-CM

## 2021-10-09 DIAGNOSIS — G4733 Obstructive sleep apnea (adult) (pediatric): Secondary | ICD-10-CM

## 2021-10-09 DIAGNOSIS — I10 Essential (primary) hypertension: Secondary | ICD-10-CM | POA: Diagnosis not present

## 2021-10-09 NOTE — Patient Instructions (Signed)
Medication Instructions:  - Your physician recommends that you continue on your current medications as directed. Please refer to the Current Medication list given to you today.  *If you need a refill on your cardiac medications before your next appointment, please call your pharmacy*   Lab Work: - Your physician recommends that you have lab work today: Lipid/ Liver  If you have labs (blood work) drawn today and your tests are completely normal, you will receive your results only by: [place] (if you have MyChart) OR A paper copy in the mail If you have any lab test that is abnormal or we need to change your treatment, we will call you to review the results.   Testing/Procedures: - none ordered   Follow-Up: At [facility], you and your health needs are our priority.  As part of our continuing mission to provide you with exceptional heart care, we have created designated Provider Care Teams.  These Care Teams include your primary Cardiologist (physician) and Advanced Practice Providers (APPs -  Physician Assistants and Nurse Practitioners) who all work together to provide you with the care you need, when you need it.  We recommend signing up for the patient portal called "MyChart".  Sign up information is provided on this After Visit Summary.  MyChart is used to connect with patients for Virtual Visits (Telemedicine).  Patients are able to view lab/test results, encounter notes, upcoming appointments, etc.  Non-urgent messages can be sent to your provider as well.   To learn more about what you can do with MyChart, go to [email or web address].    Your next appointment:   3 month(s)  The format for your next appointment:   In Person  Provider:   You may see Kathlyn [place], MD or one of the following Advanced Practice Providers on your designated Care Team:    Cadence Kathlen Mody, [place]   Other Instructions N/a

## 2021-10-10 LAB — LIPID PANEL
Chol/HDL Ratio: 3.3 ratio (ref 0.0–5.0)
Cholesterol, Total: 89 mg/dL — ABNORMAL LOW (ref 100–199)
HDL: 27 mg/dL — ABNORMAL LOW (ref >39)
LDL Chol Calc (NIH): 41 mg/dL (ref 0–99)
Triglycerides: 111 mg/dL (ref 0–149)
VLDL Cholesterol Cal: 21 mg/dL (ref 5–40)

## 2021-10-10 LAB — HEPATIC FUNCTION PANEL
ALT: 13 IU/L (ref 0–44)
AST: 13 IU/L (ref 0–40)
Albumin: 4.1 g/dL (ref 3.8–4.9)
Alkaline Phosphatase: 63 IU/L (ref 44–121)
Bilirubin Total: 0.5 mg/dL (ref 0.0–1.2)
Bilirubin, Direct: 0.19 mg/dL (ref 0.00–0.40)
Total Protein: 6.6 g/dL (ref 6.0–8.5)

## 2021-10-15 ENCOUNTER — Other Ambulatory Visit: Payer: Self-pay

## 2021-10-15 DIAGNOSIS — Z955 Presence of coronary angioplasty implant and graft: Secondary | ICD-10-CM | POA: Diagnosis not present

## 2021-10-15 DIAGNOSIS — Z48812 Encounter for surgical aftercare following surgery on the circulatory system: Secondary | ICD-10-CM | POA: Diagnosis not present

## 2021-10-15 DIAGNOSIS — I252 Old myocardial infarction: Secondary | ICD-10-CM | POA: Diagnosis not present

## 2021-10-15 DIAGNOSIS — I213 ST elevation (STEMI) myocardial infarction of unspecified site: Secondary | ICD-10-CM

## 2021-10-15 NOTE — Progress Notes (Signed)
Daily Session Note  Patient Details  Name: SHADRICK SENNE MRN: [id number] Date of Birth: [birth year] Referring Provider:   Flowsheet Row Cardiac Rehab from 10/01/2021 in [facility] Cardiac and Pulmonary Rehab  Referring Provider Arida       Encounter Date: 10/15/2021  Check In:  Session Check In - 10/15/21 6578       Check-In   Supervising physician immediately available to respond to emergencies See telemetry face sheet for immediately available ER MD    Location [facility]-Cardiac & Pulmonary Rehab    Staff Present Birdie Sons, MPA, Nino Glow, MS, ASCM CEP, Exercise Physiologist;Joseph Tessie Fass, [place]    Virtual Visit No    Medication changes reported     No    Fall or balance concerns reported    No    Tobacco Cessation No Change    Warm-up and Cool-down Performed on first and last piece of equipment    Resistance Training Performed Yes    VAD Patient? No    PAD/SET Patient? No      Pain Assessment   Currently in Pain? No/denies                Social History   Tobacco Use  Smoking Status Former   Types: Cigars   Quit date: 08/27/2021   Years since quitting: 0.1  Smokeless Tobacco Never    Goals Met:  Independence with exercise equipment Exercise tolerated well No report of concerns or symptoms today Strength training completed today  Goals Unmet:  Not Applicable  Comments: Pt able to follow exercise prescription today without complaint.  Will continue to monitor for progression.    Dr. Emily Filbert is Medical Director for [facility].  Dr. Ottie Glazier is Medical Director for [facility] Pulmonary Rehabilitation.

## 2021-10-16 ENCOUNTER — Other Ambulatory Visit: Payer: Self-pay

## 2021-10-16 ENCOUNTER — Emergency Department: Payer: BC Managed Care – PPO

## 2021-10-16 ENCOUNTER — Emergency Department
Admission: EM | Admit: 2021-10-16 | Discharge: 2021-10-16 | Disposition: A | Discharge status: Home / Self Care | Payer: BC Managed Care – PPO | Attending: Emergency Medicine | Admitting: Emergency Medicine

## 2021-10-16 DIAGNOSIS — I639 Cerebral infarction, unspecified: Secondary | ICD-10-CM | POA: Diagnosis not present

## 2021-10-16 DIAGNOSIS — I10 Essential (primary) hypertension: Secondary | ICD-10-CM | POA: Diagnosis not present

## 2021-10-16 DIAGNOSIS — Z79899 Other long term (current) drug therapy: Secondary | ICD-10-CM | POA: Diagnosis not present

## 2021-10-16 DIAGNOSIS — Z940 Kidney transplant status: Secondary | ICD-10-CM | POA: Insufficient documentation

## 2021-10-16 DIAGNOSIS — S0291XA Unspecified fracture of skull, initial encounter for closed fracture: Secondary | ICD-10-CM | POA: Diagnosis not present

## 2021-10-16 DIAGNOSIS — Z87891 Personal history of nicotine dependence: Secondary | ICD-10-CM | POA: Insufficient documentation

## 2021-10-16 DIAGNOSIS — R29818 Other symptoms and signs involving the nervous system: Secondary | ICD-10-CM | POA: Diagnosis not present

## 2021-10-16 DIAGNOSIS — Z96642 Presence of left artificial hip joint: Secondary | ICD-10-CM | POA: Diagnosis not present

## 2021-10-16 DIAGNOSIS — Z7982 Long term (current) use of aspirin: Secondary | ICD-10-CM | POA: Diagnosis not present

## 2021-10-16 DIAGNOSIS — R221 Localized swelling, mass and lump, neck: Secondary | ICD-10-CM | POA: Diagnosis not present

## 2021-10-16 DIAGNOSIS — R202 Paresthesia of skin: Secondary | ICD-10-CM | POA: Diagnosis not present

## 2021-10-16 DIAGNOSIS — R6884 Jaw pain: Secondary | ICD-10-CM | POA: Diagnosis not present

## 2021-10-16 DIAGNOSIS — R200 Anesthesia of skin: Secondary | ICD-10-CM | POA: Diagnosis not present

## 2021-10-16 LAB — CBC
HCT: 49.5 % (ref 39.0–52.0)
Hemoglobin: 18 g/dL — ABNORMAL HIGH (ref 13.0–17.0)
MCH: 33.3 pg (ref 26.0–34.0)
MCHC: 36.4 g/dL — ABNORMAL HIGH (ref 30.0–36.0)
MCV: 91.7 fL (ref 80.0–100.0)
Platelets: 213 10*3/uL (ref 150–400)
RBC: 5.4 MIL/uL (ref 4.22–5.81)
RDW: 12.8 % (ref 11.5–15.5)
WBC: 4.9 10*3/uL (ref 4.0–10.5)
nRBC: 0 % (ref 0.0–0.2)

## 2021-10-16 LAB — PROTIME-INR
INR: 1.1 (ref 0.8–1.2)
Prothrombin Time: 13.8 seconds (ref 11.4–15.2)

## 2021-10-16 LAB — COMPREHENSIVE METABOLIC PANEL
ALT: 18 U/L (ref 0–44)
AST: 17 U/L (ref 15–41)
Albumin: 4.8 g/dL (ref 3.5–5.0)
Alkaline Phosphatase: 60 U/L (ref 38–126)
Anion gap: 7 (ref 5–15)
BUN: 23 mg/dL — ABNORMAL HIGH (ref 6–20)
CO2: 26 mmol/L (ref 22–32)
Calcium: 9.2 mg/dL (ref 8.9–10.3)
Chloride: 101 mmol/L (ref 98–111)
Creatinine, Ser: 1.4 mg/dL — ABNORMAL HIGH (ref 0.61–1.24)
GFR, Estimated: 59 mL/min — ABNORMAL LOW (ref >60)
Glucose, Bld: 183 mg/dL — ABNORMAL HIGH (ref 70–99)
Potassium: 4.2 mmol/L (ref 3.5–5.1)
Sodium: 134 mmol/L — ABNORMAL LOW (ref 135–145)
Total Bilirubin: 1.4 mg/dL — ABNORMAL HIGH (ref 0.3–1.2)
Total Protein: 8 g/dL (ref 6.5–8.1)

## 2021-10-16 LAB — DIFFERENTIAL
Abs Immature Granulocytes: 0.02 10*3/uL (ref 0.00–0.07)
Basophils Absolute: 0 10*3/uL (ref 0.0–0.1)
Basophils Relative: 1 %
Eosinophils Absolute: 0.1 10*3/uL (ref 0.0–0.5)
Eosinophils Relative: 2 %
Immature Granulocytes: 0 %
Lymphocytes Relative: 33 %
Lymphs Abs: 1.6 10*3/uL (ref 0.7–4.0)
Monocytes Absolute: 0.3 10*3/uL (ref 0.1–1.0)
Monocytes Relative: 5 %
Neutro Abs: 2.9 10*3/uL (ref 1.7–7.7)
Neutrophils Relative %: 59 %

## 2021-10-16 LAB — APTT: aPTT: 26 seconds (ref 24–36)

## 2021-10-16 LAB — CBG MONITORING, ED: Glucose-Capillary: 186 mg/dL — ABNORMAL HIGH (ref 70–99)

## 2021-10-16 LAB — TROPONIN I (HIGH SENSITIVITY): Troponin I (High Sensitivity): 7 ng/L (ref <18)

## 2021-10-16 MED ORDER — SODIUM CHLORIDE 0.9% FLUSH
3.0000 mL | Freq: Once | INTRAVENOUS | Status: DC
Start: 2021-10-16 — End: 2021-10-16

## 2021-10-16 NOTE — Progress Notes (Signed)
Chaplain responded to code stroke page. Upon arrival patient already taken to CT. Inquired of family none present yet. Stroke code cancelled 14:05 please contact chaplain if additional support is needed.

## 2021-10-16 NOTE — ED Notes (Signed)
Per MRI tech, MRI was not performed.  Pt stated claustrophobia and declined. Pt asked about being medicated for anxiety prior to procedure and declined.  EDP messaged by MRI.

## 2021-10-16 NOTE — ED Provider Notes (Signed)
[name] Emergency Department Provider Note   ____________________________________________   Event Date/Time   First MD Initiated Contact with Patient 10/16/21 1340     (approximate)  I have reviewed the triage vital signs and the nursing notes.   HISTORY  Chief Complaint Numbness  EM caveat, some limitation due to concerns of acute neurologic symptomatology, potential time sensitive condition and activation of a code stroke  HPI Chad Mccann is a 55 y.o. male who was in his normal health he said at about 1 PM today he was getting ready to have his lunch break when he suddenly noticed he felt numb over his right chin and right neck and some into his right arm and right leg.  He also has a slight feeling of fullness along the right side of the neck just below the jawline that seems to be new now as well.  No trouble breathing no headache denies chest pain no trouble other symptoms.  No weakness no difficulty speaking.  Patient and his wife are concerned he could be having something like a stroke.  He did have a recent heart attack   Past Medical History:  Diagnosis Date   Benign prostatic hypertrophy    Bladder spasm    Erectile dysfunction    Frequency    GERD (gastroesophageal reflux disease)    Hypertension    Hypogonadism in male    Kidney 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    Kidney, malrotation    MGUS (monoclonal gammopathy of unknown significance)    Monoclonal paraproteinemia    Obesity, unspecified    Other specified congenital anomaly of kidney    Other specified disorders of bladder    Other testicular hypofunction    Premature ejaculation    Sleep apnea    Ventral hernia     Patient Active Problem List   Diagnosis Date Noted   Acute ST elevation myocardial infarction (STEMI) of anterior wall ([place]) 08/27/2021   STEMI involving left anterior descending coronary artery ([place]) 08/27/2021   AKI (acute kidney injury) ([place]) 08/27/2021   HLD  (hyperlipidemia) 08/27/2021   Status post total hip replacement, left 03/02/2019   Strain of knee 08/17/2018   Abdominal pain 02/11/2016   Notalgia 12/10/2015   History of transplantation, renal 12/10/2015   Acid reflux 07/10/2015   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   BPH with obstruction/lower urinary tract symptoms 07/10/2015   Hypogonadism in male 07/10/2015   Erectile dysfunction of organic origin 07/10/2015   Ventral hernia 12/05/2013   MGUS (monoclonal gammopathy of unknown significance) 06/28/2013   Focal and segmental hyalinosis 04/21/2013   H/O kidney transplant 01/28/2012   Adiposity 02/12/2011   Obstructive apnea 02/12/2011   Essential (primary) hypertension 09/05/2010    Past Surgical History:  Procedure Laterality Date   BACK SURGERY     c5/6 fusion after discectomy 3 weeks prior   COLONOSCOPY     polyp removal   CORONARY/GRAFT ACUTE MI REVASCULARIZATION N/A 08/27/2021   Procedure: Coronary/Graft Acute MI Revascularization;  Surgeon: Wellington Hampshire, MD;  Location: [facility];  Service: Cardiovascular;  Laterality: N/A;   dialysis port placement     and removal   HERNIA REPAIR  12/29/13   ventral   KIDNEY TRANSPLANT  01/2012   LEFT HEART CATH AND CORONARY ANGIOGRAPHY N/A 08/27/2021   Procedure: LEFT HEART CATH AND CORONARY ANGIOGRAPHY;  Surgeon: Wellington Hampshire, MD;  Location: [facility];  Service: Cardiovascular;  Laterality: N/A;   SPINE SURGERY  cervical fusion   TOTAL HIP ARTHROPLASTY Left 03/02/2019   Procedure: TOTAL HIP ARTHROPLASTY ANTERIOR APPROACH-LEFT;  Surgeon: Hessie Knows, MD;  Location: [facility];  Service: Orthopedics;  Laterality: Left;    Prior to Admission medications   Medication Sig Start Date End Date Taking? Authorizing Provider  acetaminophen (TYLENOL) 325 MG tablet Take 1-2 tablets (325-650 mg total) by mouth every 6 (six) hours as needed for mild pain (pain score 1-3 or temp > 100.5). 03/04/19   Duanne Guess, PA-C  aspirin  81 MG chewable tablet Chew 1 tablet (81 mg total) by mouth daily. 08/30/21   Fritzi Mandes, MD  carvedilol (COREG) 6.25 MG tablet Take 1 tablet (6.25 mg total) by mouth 2 (two) times daily with a meal. 08/29/21   Fritzi Mandes, MD  clonazePAM (KLONOPIN) 0.5 MG tablet Take 0.5 mg by mouth at bedtime.    [provider]  mycophenolate (CELLCEPT) 250 MG capsule Take 750 mg by mouth 2 (two) times daily. 10/03/17   [provider]  nitroGLYCERIN (NITROSTAT) 0.4 MG SL tablet Place 1 tablet (0.4 mg total) under the tongue every 5 (five) minutes as needed for chest pain. 08/29/21   Fritzi Mandes, MD  prasugrel (EFFIENT) 10 MG TABS tablet Take 1 tablet (10 mg total) by mouth daily. 08/30/21   Fritzi Mandes, MD  rosuvastatin (CRESTOR) 40 MG tablet Take 1 tablet (40 mg total) by mouth daily. 08/30/21   Fritzi Mandes, MD  tacrolimus (PROGRAF) 1 MG capsule Take 2 mg by mouth 2 (two) times daily.    [provider]  tadalafil (CIALIS) 20 MG tablet Take 1 tablet (20 mg total) by mouth daily as needed for erectile dysfunction. 12/01/20   Zara Council A, PA-C  telmisartan (MICARDIS) 20 MG tablet Take 20 mg by mouth daily. 02/16/19   [provider]  testosterone cypionate (DEPOTESTOSTERONE CYPIONATE) 200 MG/ML injection Inject 0.75  mL into the muscle every 14 days 05/29/21   Zara Council A, PA-C    Allergies Hydralazine, Morphine and related, Prednisone, and Shellfish allergy  Family History  Problem Relation Age of Onset   Colon cancer Father 35   Heart attack Father    Stroke Brother    Colon cancer Maternal Grandfather    Kidney disease Neg Hx    Prostate cancer Neg Hx    Kidney cancer Neg Hx    Bladder Cancer Neg Hx     Social History Social History   Tobacco Use   Smoking status: Former    Types: Cigars    Quit date: 08/27/2021    Years since quitting: 0.1   Smokeless tobacco: Never  Substance Use Topics   Alcohol use: Yes    Alcohol/week: 0.0 standard drinks     Comment: occ   Drug use: No    Review of Systems Constitutional: No fever/chills Eyes: No visual changes. ENT: No sore throat.  Feels slightly swollen over the right side of his neck very minor but feels like there is a "lump type feeling there. Cardiovascular: Denies chest pain. Respiratory: Denies shortness of breath. Gastrointestinal: No abdominal pain.   Genitourinary: Negative for dysuria. Musculoskeletal: Negative for back pain. Skin: Negative for rash. Neurological: Negative for headaches, areas of focal weakness or numbness except along the right side of the jaw arm and right leg.    ____________________________________________   PHYSICAL EXAM:  VITAL SIGNS: ED Triage Vitals [10/16/21 1338]  Enc Vitals Group     BP  Pulse      Resp      Temp      Temp src      SpO2      Weight      Height      Head Circumference      Peak Flow      Pain Score 0     Pain Loc      Pain Edu?      Excl. in [place]?     Constitutional: Alert and oriented. Well appearing and in no acute distress. Eyes: Conjunctivae are normal. Head: Atraumatic. Nose: No congestion/rhinnorhea. Mouth/Throat: Mucous membranes are moist.  He does seem to have some submandibular fullness to the neckline bilaterally but it seems to me and likely more of a fatty tissue type of appearance, I do not feel a discernible obvious abscess or swelling along either side of the neck line. Neck: No stridor.  Oropharynx is patent.  No oral edema. Cardiovascular: Normal rate, regular rhythm. Grossly normal heart sounds.  Good peripheral circulation. Respiratory: Normal respiratory effort.  No retractions. Lungs CTAB. Gastrointestinal: Soft and nontender. No distention. Musculoskeletal: No lower extremity tenderness nor edema. Neurologic:  Normal speech and language. No gross focal neurologic deficits are appreciated except he does report a feeling of slight diminishment in sensation over the right face right arm and  right leg otherwise demonstrates normal strength normal cranial nerve exam normal extraocular movements.  LVO exam is negative (VAN) Skin:  Skin is warm, dry and intact. No rash noted. Psychiatric: Mood and affect are normal. Speech and behavior are normal.  ____________________________________________   LABS (all labs ordered are listed, but only abnormal results are displayed)  Labs Reviewed  CBC - Abnormal; Notable for the following components:      Result Value   Hemoglobin 18.0 (*)    MCHC 36.4 (*)    All other components within normal limits  COMPREHENSIVE METABOLIC PANEL - Abnormal; Notable for the following components:   Sodium 134 (*)    Glucose, Bld 183 (*)    BUN 23 (*)    Creatinine, Ser 1.40 (*)    Total Bilirubin 1.4 (*)    GFR, Estimated 59 (*)    All other components within normal limits  CBG MONITORING, ED - Abnormal; Notable for the following components:   Glucose-Capillary 186 (*)    All other components within normal limits  PROTIME-INR  APTT  DIFFERENTIAL  TROPONIN I (HIGH SENSITIVITY)  TROPONIN I (HIGH SENSITIVITY)   ____________________________________________  EKG  Is reviewed and interpreted by me at 1340 Heart rate 75 QRS 80 QTc 400 Normal sinus rhythm, no evidence of acute ischemia or ectopy.  Minimal T wave inversion noted in 1 and aVL trop  ____________________________________________  RADIOLOGY  CT Soft Tissue Neck Wo Contrast  Result Date: 10/16/2021 CLINICAL DATA:  Foreign body suspected, right submandibular swelling EXAM: CT NECK WITHOUT CONTRAST TECHNIQUE: Multidetector CT imaging of the neck was performed following the standard protocol without intravenous contrast. COMPARISON:  None. FINDINGS: Pharynx and larynx: The nasal cavity and nasopharynx are normal. The oral cavity and oropharynx are normal. The hypopharynx and larynx are normal. There is no abnormal mass or fluid collection, within the confines of noncontrast technique.  There is no foreign body identified. Salivary glands: The parotid and submandibular glands are unremarkable. Specifically, there is no evidence of salivary gland swelling or surrounding inflammatory change. No sialolithiasis is seen. Thyroid: Unremarkable. Lymph nodes: There is no pathologic lymphadenopathy in  the neck. Vascular: Unremarkable, within the confines of noncontrast technique. Limited intracranial: The imaged portions of the intracranial compartment are grossly unremarkable. Visualized orbits: The imaged globes and orbits are unremarkable. Mastoids and visualized paranasal sinuses: The imaged paranasal sinuses are clear. The mastoid air cells are clear. Skeleton: Postsurgical changes reflecting posterior instrumented fusion and interbody spacer placement at C5-C6 are noted. There is mild degenerative change throughout the remainder of the cervical spine. There is no acute osseous abnormality or aggressive osseous lesion. Upper chest: The imaged lungs are clear. Other: None. IMPRESSION: 1. No foreign body identified in the neck. 2. No other acute findings. Normal appearance of the parotid and submandibular glands. Electronically Signed   By: Valetta Mole M.D.   On: 10/16/2021 15:34   CT HEAD CODE STROKE WO CONTRAST  Result Date: 10/16/2021 CLINICAL DATA:  Code stroke.  Neuro deficit, acute, stroke suspected EXAM: CT HEAD WITHOUT CONTRAST TECHNIQUE: Contiguous axial images were obtained from the base of the skull through the vertex without intravenous contrast. COMPARISON:  CT head November 07, 2018. FINDINGS: Brain: No evidence of acute large vascular territory infarction, hemorrhage, hydrocephalus, extra-axial collection or mass lesion/mass effect. Vascular: No hyperdense vessel identified. Skull: Acute fracture.  No Sinuses/Orbits: Clear sinuses.  Unremarkable orbits. Other: No mastoid effusions. ASPECTS [facility] Stroke Program Early CT Score) Total score (0-10 with 10 being normal): 10.  IMPRESSION: No evidence of acute intracranial abnormality.  ASPECTS is 10. Findings discussed with Dr. Jacqualine Code via telephone at 2:02 p.m. Electronically Signed   By: Margaretha Sheffield M.D.   On: 10/16/2021 14:04     CT imaging reviewed negative for acute finding in the neck.  CT head reviewed negative for acute ____________________________________________   PROCEDURES  Procedure(s) performed: None  Procedures  Critical Care performed: No  ____________________________________________   INITIAL IMPRESSION / ASSESSMENT AND PLAN / ED COURSE  Pertinent labs & imaging results that were available during my care of the patient were reviewed by me and considered in my medical decision making (see chart for details).   Patient presents reports that he was eating and suddenly felt a feeling of a "knot" occurring just below the right jaw and then experienced a tingling feeling over his right neck.  No associated chest pain no fever no chills no shortness of breath.  Did have a recent heart attack but denies any pain or symptoms that feels similar.  He was just beginning to eat when this happened.  Reports he feels like he may have slightly bit the left side of his tongue and then suddenly had this clenching or not tight feeling in his neck.  Con neurologic exam reassuring.  Seen by Dr. Quinn Axe for the concerns of the acute paresthesia and exclude stroke/TIA early in symptomatology.  Dr. Quinn Axe advised does not appear consistent with stroke.  Clinical Course as of 10/16/21 1935  Tue Oct 16, 2021  1406 Neurology - Dr. Quinn Axe advises no evidence of stroke. Concern may have parotitis or other process causing localized paresthesia.  [MQ]  [id number] Patient reports previous renal transplant.  We will proceed with CT scan of the neck without contrast at this point [MQ]  1715 Patient reassessed at approximately 4 PM.  Reports that his numbness has resolved and the discomfort around his right lower jawline and  feeling of having a "knot" in the area has also gone away.  He feels back to normal [MQ]    Clinical Course User Index [MQ] Delman Kitten, MD  I had discussion with the patient after he attempted MRI he was unable to tolerate due to claustrophobia.  With shared medical decision making and discussion of his symptomatology my concern and wish to evaluate and exclude stroke or possible problem with his main artery such as his carotid artery.  With shared medical decision making and given complete resolution of his symptoms at this point we discussed further evaluation such as CT scan of the neck with contrast, and having discussed further laying out risks and benefits patient given resolution of his symptoms that he would be comfortable discharging with close follow-up.  I think his request decision is quite reasonable, he had a CT of the neck without acute finding his all of his symptoms are resolved, I do not think that a carotid artery aneurysm or necessarily a dissection would occur and resolve in this manner and he is resting comfortably without distress.  All resolution of all symptoms at this point.  In addition the patient has a history of renal transplant and would like to avoid contrast agents which seems quite reasonable  We did have a very careful discussion on return precautions and he is very agreeable with this  Return precautions and treatment recommendations and follow-up discussed with the patient who is agreeable with the plan.   ____________________________________________   FINAL CLINICAL IMPRESSION(S) / ED DIAGNOSES  Final diagnoses:  Pain in lower jaw  Paresthesia        Note:  This document was prepared using Dragon voice recognition software and may include unintentional dictation errors       Delman Kitten, MD 10/16/21 1939

## 2021-10-16 NOTE — Consult Note (Signed)
NEUROLOGY CONSULTATION NOTE   Date of service: October 16, 2021 Patient Name: Chad Mccann MRN:  [id number] DOB:  1966-10-01 Reason for consult: stroke code Requesting physician: Delman Kitten MD _ _ _   _ __   _ __ _ _  __ __   _ __   __ _  History of Present Illness   This is a 55 year old gentleman with a history of STEMI in September 2022, renal transplant 2016 on immunosuppression, MGUS, hypertension, hyperlipidemia, OSA who presented to the emergency department complaining of acute swelling and paresthesias of his right face.  He was in his normal state of health until 1300 today when he was eating lasagna and suddenly felt acute development of swelling/palpable mass under the angle of his right jaw.  This area is visibly swollen on examination and there is a firm nonmobile nonpulsatile mass in the region of the R parotid gland.    He then developed paresthesias in the right side of his face with this more pronounced in the lower face than upper.  Otherwise he is completely neurologically intact, no paresthesias elsewhere, no focal weakness, no headache, no speech disturbance, no imbalance, no gait difficulty.  Stroke code was initiated due to concern about the paresthesias.  Head CT was unremarkable.  NIH stroke scale was 1 for paresthesias.  TNKase was not considered because symptoms were felt to be related to local effects from parotitis or alt etiology for his neck swelling. Stroke code was thus cancelled and no further advanced imaging was ordered by me. No difficulty breathing.    ROS   Per HPI: all other systems reviewed and are negative  Past History   I have reviewed the following:  Past Medical History:  Diagnosis Date   Benign prostatic hypertrophy    Bladder spasm    Erectile dysfunction    Frequency    GERD (gastroesophageal reflux disease)    Hypertension    Hypogonadism in male    Kidney 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    Kidney, malrotation    MGUS (monoclonal  gammopathy of unknown significance)    Monoclonal paraproteinemia    Obesity, unspecified    Other specified congenital anomaly of kidney    Other specified disorders of bladder    Other testicular hypofunction    Premature ejaculation    Sleep apnea    Ventral hernia    Past Surgical History:  Procedure Laterality Date   BACK SURGERY     c5/6 fusion after discectomy 3 weeks prior   COLONOSCOPY     polyp removal   CORONARY/GRAFT ACUTE MI REVASCULARIZATION N/A 08/27/2021   Procedure: Coronary/Graft Acute MI Revascularization;  Surgeon: Wellington Hampshire, MD;  Location: [facility];  Service: Cardiovascular;  Laterality: N/A;   dialysis port placement     and removal   HERNIA REPAIR  12/29/13   ventral   KIDNEY TRANSPLANT  01/2012   LEFT HEART CATH AND CORONARY ANGIOGRAPHY N/A 08/27/2021   Procedure: LEFT HEART CATH AND CORONARY ANGIOGRAPHY;  Surgeon: Wellington Hampshire, MD;  Location: [facility];  Service: Cardiovascular;  Laterality: N/A;   SPINE SURGERY     cervical fusion   TOTAL HIP ARTHROPLASTY Left 03/02/2019   Procedure: TOTAL HIP ARTHROPLASTY ANTERIOR APPROACH-LEFT;  Surgeon: Hessie Knows, MD;  Location: [facility];  Service: Orthopedics;  Laterality: Left;   Family History  Problem Relation Age of Onset   Colon cancer Father 92   Heart attack Father  Stroke Brother    Colon cancer Maternal Grandfather    Kidney disease Neg Hx    Prostate cancer Neg Hx    Kidney cancer Neg Hx    Bladder Cancer Neg Hx    Social History   Socioeconomic History   Marital status: Married    Spouse name: Not on file   Number of children: Not on file   Years of education: Not on file   Highest education level: Not on file  Occupational History   Occupation: Hotel manager  Tobacco Use   Smoking status: Former    Types: Cigars    Quit date: 08/27/2021    Years since quitting: 0.1   Smokeless tobacco: Never  Substance and Sexual Activity   Alcohol use: Yes     Alcohol/week: 0.0 standard drinks    Comment: occ   Drug use: No   Sexual activity: Yes  Other Topics Concern   Not on file  Social History Narrative   Not on file   Social Determinants of Health   Financial Resource Strain: Not on file  Food Insecurity: Not on file  Transportation Needs: Not on file  Physical Activity: Not on file  Stress: Not on file  Social Connections: Not on file   Allergies  Allergen Reactions   Hydralazine Other (See Comments)    Nose bleeds/light headed   Morphine And Related Other (See Comments)    "difficulty waking patient"   Prednisone     hallucinations   Shellfish Allergy Nausea And Vomiting    Medications    Current Facility-Administered Medications:    sodium chloride flush (NS) 0.9 % injection 3 mL, 3 mL, Intravenous, Once, Duffy Bruce, MD  Current Outpatient Medications:    acetaminophen (TYLENOL) 325 MG tablet, Take 1-2 tablets (325-650 mg total) by mouth every 6 (six) hours as needed for mild pain (pain score 1-3 or temp > 100.5)., Disp: , Rfl:    aspirin 81 MG chewable tablet, Chew 1 tablet (81 mg total) by mouth daily., Disp: 30 tablet, Rfl: 11   carvedilol (COREG) 6.25 MG tablet, Take 1 tablet (6.25 mg total) by mouth 2 (two) times daily with a meal., Disp: 60 tablet, Rfl: 3   clonazePAM (KLONOPIN) 0.5 MG tablet, Take 0.5 mg by mouth at bedtime., Disp: , Rfl:    mycophenolate (CELLCEPT) 250 MG capsule, Take 750 mg by mouth 2 (two) times daily., Disp: , Rfl:    nitroGLYCERIN (NITROSTAT) 0.4 MG SL tablet, Place 1 tablet (0.4 mg total) under the tongue every 5 (five) minutes as needed for chest pain., Disp: 30 tablet, Rfl: 12   prasugrel (EFFIENT) 10 MG TABS tablet, Take 1 tablet (10 mg total) by mouth daily., Disp: 30 tablet, Rfl: 10   rosuvastatin (CRESTOR) 40 MG tablet, Take 1 tablet (40 mg total) by mouth daily., Disp: 30 tablet, Rfl: 3   tacrolimus (PROGRAF) 1 MG capsule, Take 2 mg by mouth 2 (two) times daily., Disp: , Rfl:     tadalafil (CIALIS) 20 MG tablet, Take 1 tablet (20 mg total) by mouth daily as needed for erectile dysfunction., Disp: 6 tablet, Rfl: 11   telmisartan (MICARDIS) 20 MG tablet, Take 20 mg by mouth daily., Disp: , Rfl:    testosterone cypionate (DEPOTESTOSTERONE CYPIONATE) 200 MG/ML injection, Inject 0.75  mL into the muscle every 14 days, Disp: 10 mL, Rfl: 0  Vitals   Vitals:   10/16/21 1338 10/16/21 1431  BP: (!) 181/103 (!) 142/63  Pulse:  73 80  Resp: 19 16  SpO2: 99% 96%     There is no height or weight on file to calculate BMI.  Physical Exam   Physical Exam Gen: A&O x4, NAD HEENT: Atraumatic, normocephalic;mucous membranes moist; oropharynx clear, tongue without atrophy or fasciculations. Neck: This area is visibly swollen on examination and there is a firm nonmobile nonpulsatile mass in the under the R mandible Resp: CTAB, no w/r/r CV: RRR, no m/g/r; nml S1 and S2. 2+ symmetric peripheral pulses. Abd: soft/NT/ND; nabs x 4 quad Extrem: Nml bulk; no cyanosis, clubbing, or edema.  Neuro: *MS: A&O x4. Follows multi-step commands.  *Speech: fluid, nondysarthric, able to name and repeat *CN:    I: Deferred   II,III: PERRLA, VFF by confrontation, optic discs unable to be visualized 2/2 pupillary constriction   III,IV,VI: EOMI w/o nystagmus, no ptosis   V: Sensation impaired V3>V2=V1 on R   VII: Eyelid closure was full.  Smile symmetric.   VIII: Hearing intact to voice   IX,X: Voice normal, palate elevates symmetrically    XI: SCM/trap 5/5 bilat   XII: Tongue protrudes midline, no atrophy or fasciculations   *Motor:   Normal bulk.  No tremor, rigidity or bradykinesia. No pronator drift.    Strength: Dlt Bic Tri WrE WrF FgS Gr HF KnF KnE PlF DoF    Left 5 5 5 5 5 5 5 5 5 5 5 5     Right 5 5 5 5 5 5 5 5 5 5 5 5     *Sensory: Intact to light touch, pinprick, temperature vibration throughout. Symmetric. Propioception intact bilat.  No double-simultaneous extinction.   *Coordination:  Finger-to-nose, heel-to-shin, rapid alternating motions were intact. *Reflexes:  2+ and symmetric throughout without clonus; toes down-going bilat *Gait: deferred  NIHSS = 1 facial paresthesias   Premorbid mRS = 0   Labs   CBC:  Recent Labs  Lab 10/16/21 1343  WBC 4.9  NEUTROABS 2.9  HGB 18.0*  HCT 49.5  MCV 91.7  PLT 102    Basic Metabolic Panel:  Lab Results  Component Value Date   NA 134 (L) 10/16/2021   K 4.2 10/16/2021   CO2 26 10/16/2021   GLUCOSE 183 (H) 10/16/2021   BUN 23 (H) 10/16/2021   CREATININE 1.40 (H) 10/16/2021   CALCIUM 9.2 10/16/2021   GFRNONAA 59 (L) 10/16/2021   GFRAA 82 12/01/2020   Lipid Panel:  Lab Results  Component Value Date   LDLCALC 41 10/09/2021   HgbA1c:  Lab Results  Component Value Date   HGBA1C 5.5 08/27/2021   Urine Drug Screen: No results found for: LABOPIA, COCAINSCRNUR, LABBENZ, AMPHETMU, THCU, LABBARB  Alcohol Level No results found for: ETH   Impression   This is a 55 year old gentleman with a history of STEMI in September 2022, renal transplant 2016 on immunosuppression, MGUS, hypertension, hyperlipidemia, OSA who presented to the emergency department complaining of acute swelling and paresthesias of his right face associated with tender palpable nonmobile mass below the R mandible that developed acutely while eating lasagna. This was present on my examination. His only neurologic deficit on my examination was paresthesias of R face, and TNKase was not considered because symptoms were felt to be related to local effects from parotitis or alt etiology for his neck swelling. Stroke code was thus cancelled.  Recommendations   No further inpatient neurologic workup indicated; neurology to sign off but please re-engage if additional questions arise.  Addendum 1624: Dr. Jacqualine Code reported that CT  neck soft tissue was negative for mass or other abnl. Although my suspicion is low for CVA or vascular etiology of  these sx we agreed that he would order MRI brain wo contrast to r/o stroke and MRA neck wo contrast to r/o other vascular etiology. We would like to avoid contrast if possible given hx renal transplant. Dr. Jacqualine Code will call me if either study is abnormal. ______________________________________________________________________   Thank you for the opportunity to take part in the care of this patient. If you have any further questions, please contact the neurology consultation attending.  Signed,  Su Monks, MD [facility] [phone]  If 7pm- 7am, please page neurology on call as listed in [place].

## 2021-10-16 NOTE — ED Triage Notes (Signed)
Pt states at 1300 today when sat down to take his lunch break he had sudden onset right sided facial numbness with felling like something swollen on the right side of neck.

## 2021-10-16 NOTE — ED Notes (Signed)
Pt states numbness on right side of face completely gone, C/O soreness in rt side of neck just below ear.

## 2021-10-16 NOTE — Discharge Instructions (Addendum)
Please return to the emergency room right away if your symptoms recur.  In particular if you develop headache numbness weakness recurrent swelling or neck pain or other new concerns arise please return right away.

## 2021-10-17 ENCOUNTER — Telehealth: Payer: Self-pay

## 2021-10-17 NOTE — Telephone Encounter (Signed)
Chad Mccann called out today due to half of his head swilling which brought him to the Ed yesterday. He is seeing his MD tomorrow and will need clearance before returning to rehab.

## 2021-10-24 ENCOUNTER — Encounter: Payer: Self-pay | Admitting: *Deleted

## 2021-10-24 ENCOUNTER — Encounter: Payer: BC Managed Care – PPO | Attending: Cardiovascular Disease

## 2021-10-24 DIAGNOSIS — Z955 Presence of coronary angioplasty implant and graft: Secondary | ICD-10-CM

## 2021-10-24 DIAGNOSIS — I213 ST elevation (STEMI) myocardial infarction of unspecified site: Secondary | ICD-10-CM

## 2021-10-24 DIAGNOSIS — Z48812 Encounter for surgical aftercare following surgery on the circulatory system: Secondary | ICD-10-CM | POA: Insufficient documentation

## 2021-10-24 DIAGNOSIS — I252 Old myocardial infarction: Secondary | ICD-10-CM | POA: Insufficient documentation

## 2021-10-24 NOTE — Progress Notes (Signed)
Cardiac Individual Treatment Plan  Patient Details  Name: Chad Mccann MRN: [id number] Date of Birth: 1966/06/09 Referring Provider:   Flowsheet Row Cardiac Rehab from 10/01/2021 in [facility] Cardiac and Pulmonary Rehab  Referring Provider Arida       Initial Encounter Date:  Flowsheet Row Cardiac Rehab from 10/01/2021 in [facility] Cardiac and Pulmonary Rehab  Date 10/01/21       Visit Diagnosis: ST elevation myocardial infarction (STEMI), unspecified artery [facility])  Status post coronary artery stent placement  Patient's Home Medications on Admission:  Current Outpatient Medications:    acetaminophen (TYLENOL) 325 MG tablet, Take 1-2 tablets (325-650 mg total) by mouth every 6 (six) hours as needed for mild pain (pain score 1-3 or temp > 100.5)., Disp: , Rfl:    aspirin 81 MG chewable tablet, Chew 1 tablet (81 mg total) by mouth daily., Disp: 30 tablet, Rfl: 11   carvedilol (COREG) 6.25 MG tablet, Take 1 tablet (6.25 mg total) by mouth 2 (two) times daily with a meal., Disp: 60 tablet, Rfl: 3   clonazePAM (KLONOPIN) 0.5 MG tablet, Take 0.5 mg by mouth at bedtime., Disp: , Rfl:    mycophenolate (CELLCEPT) 250 MG capsule, Take 750 mg by mouth 2 (two) times daily., Disp: , Rfl:    nitroGLYCERIN (NITROSTAT) 0.4 MG SL tablet, Place 1 tablet (0.4 mg total) under the tongue every 5 (five) minutes as needed for chest pain., Disp: 30 tablet, Rfl: 12   prasugrel (EFFIENT) 10 MG TABS tablet, Take 1 tablet (10 mg total) by mouth daily., Disp: 30 tablet, Rfl: 10   rosuvastatin (CRESTOR) 40 MG tablet, Take 1 tablet (40 mg total) by mouth daily., Disp: 30 tablet, Rfl: 3   tacrolimus (PROGRAF) 1 MG capsule, Take 2 mg by mouth 2 (two) times daily., Disp: , Rfl:    tadalafil (CIALIS) 20 MG tablet, Take 1 tablet (20 mg total) by mouth daily as needed for erectile dysfunction., Disp: 6 tablet, Rfl: 11   telmisartan (MICARDIS) 20 MG tablet, Take 20 mg by mouth daily., Disp: , Rfl:    testosterone cypionate  (DEPOTESTOSTERONE CYPIONATE) 200 MG/ML injection, Inject 0.75  mL into the muscle every 14 days, Disp: 10 mL, Rfl: 0  Past Medical History: Past Medical History:  Diagnosis Date   Benign prostatic hypertrophy    Bladder spasm    Erectile dysfunction    Frequency    GERD (gastroesophageal reflux disease)    Hypertension    Hypogonadism in male    Kidney 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    Kidney, malrotation    MGUS (monoclonal gammopathy of unknown significance)    Monoclonal paraproteinemia    Obesity, unspecified    Other specified congenital anomaly of kidney    Other specified disorders of bladder    Other testicular hypofunction    Premature ejaculation    Sleep apnea    Ventral hernia     Tobacco Use: Social History   Tobacco Use  Smoking Status Former   Types: Cigars   Quit date: 08/27/2021   Years since quitting: 0.1  Smokeless Tobacco Never    Labs: Recent Review Flowsheet Data     Labs for ITP Cardiac and Pulmonary Rehab Latest Ref Rng & Units 11/28/2015 12/01/2020 08/27/2021 10/09/2021   Cholestrol 100 - 199 mg/dL 130 142 143 89(L)   LDLCALC 0 - 99 mg/dL 74 90 82 41   HDL >39 mg/dL 25(L) 27(L) 28(L) 27(L)   Trlycerides 0 - 149 mg/dL 157(H) 136 166(H) 111  Hemoglobin A1c 4.8 - 5.6 % - - 5.5 -        Exercise Target Goals: Exercise Program Goal: Individual exercise prescription set using results from initial 6 min walk test and THRR while considering  patient's activity barriers and safety.   Exercise Prescription Goal: Initial exercise prescription builds to 30-45 minutes a day of aerobic activity, 2-3 days per week.  Home exercise guidelines will be given to patient during program as part of exercise prescription that the participant will acknowledge.   Education: Aerobic Exercise: - Group verbal and visual presentation on the components of exercise prescription. Introduces F.I.T.T principle from ACSM for exercise prescriptions.  Reviews F.I.T.T. principles of  aerobic exercise including progression. Written material given at graduation.   Education: Resistance Exercise: - Group verbal and visual presentation on the components of exercise prescription. Introduces F.I.T.T principle from ACSM for exercise prescriptions  Reviews F.I.T.T. principles of resistance exercise including progression. Written material given at graduation.    Education: Exercise & Equipment Safety: - Individual verbal instruction and demonstration of equipment use and safety with use of the equipment. Flowsheet Row Cardiac Rehab from 10/01/2021 in [facility] Cardiac and Pulmonary Rehab  Date 10/01/21  Educator [facility]  Instruction Review Code 1- Verbalizes Understanding       Education: Exercise Physiology & General Exercise Guidelines: - Group verbal and written instruction with models to review the exercise physiology of the cardiovascular system and associated critical values. Provides general exercise guidelines with specific guidelines to those with heart or lung disease.    Education: Flexibility, Balance, Mind/Body Relaxation: - Group verbal and visual presentation with interactive activity on the components of exercise prescription. Introduces F.I.T.T principle from ACSM for exercise prescriptions. Reviews F.I.T.T. principles of flexibility and balance exercise training including progression. Also discusses the mind body connection.  Reviews various relaxation techniques to help reduce and manage stress (i.e. Deep breathing, progressive muscle relaxation, and visualization). Balance handout provided to take home. Written material given at graduation.   Activity Barriers & Risk Stratification:  Activity Barriers & Cardiac Risk Stratification - 09/21/21 1404       Activity Barriers & Cardiac Risk Stratification   Activity Barriers Left Hip Replacement;Back Problems;Neck/Spine Problems    Cardiac Risk Stratification Moderate             6 Minute Walk:  6 Minute Walk      Row Name 10/01/21 1625         6 Minute Walk   Phase Initial     Distance 1555 feet     Walk Time 6 minutes     # of Rest Breaks 0     MPH 2.95     METS 3.84     RPE 9     VO2 Peak 13.44     Symptoms No     Resting HR 61 bpm     Resting BP 138/82     Resting Oxygen Saturation  96 %     Exercise Oxygen Saturation  during 6 min walk 98 %     Max Ex. HR 92 bpm     Max Ex. BP 150/80     2 Minute Post BP 126/72              Oxygen Initial Assessment:   Oxygen Re-Evaluation:   Oxygen Discharge (Final Oxygen Re-Evaluation):   Initial Exercise Prescription:  Initial Exercise Prescription - 10/01/21 1600       Date of Initial Exercise RX and  Referring Provider   Date 10/01/21    Referring Provider Arida      Oxygen   Maintain Oxygen Saturation 88% or higher      Treadmill   MPH 2.7    Grade 0.5    Minutes 15    METs 3.25      Recumbant Bike   Level 2    RPM 60    Watts 30    Minutes 15    METs 3.8      Elliptical   Level 1    Speed 3    Minutes 15      REL-XR   Level 3    Speed 50    Minutes 15    METs 3.8      Prescription Details   Frequency (times per week) 2    Duration Progress to 30 minutes of continuous aerobic without signs/symptoms of physical distress      Intensity   THRR 40-80% of Max Heartrate 102-143    Ratings of Perceived Exertion 11-13    Perceived Dyspnea 0-4      Progression   Progression Continue to progress workloads to maintain intensity without signs/symptoms of physical distress.      Resistance Training   Training Prescription Yes    Weight 4    Reps 10-15             Perform Capillary Blood Glucose checks as needed.  Exercise Prescription Changes:   Exercise Prescription Changes     Row Name 10/01/21 1600 10/15/21 1500           Response to Exercise   Blood Pressure (Admit) 138/82 102/64      Blood Pressure (Exercise) 150/80 122/60      Blood Pressure (Exit) 126/72 122/62      Heart  Rate (Admit) 61 bpm 85 bpm      Heart Rate (Exercise) 92 bpm 94 bpm      Heart Rate (Exit) 69 bpm 68 bpm      Oxygen Saturation (Admit) 96 % --      Oxygen Saturation (Exercise) 98 % --      Rating of Perceived Exertion (Exercise) 9 11      Symptoms none none      Comments walk test results first day      Duration -- Progress to 30 minutes of  aerobic without signs/symptoms of physical distress      Intensity -- THRR unchanged        Progression   Progression -- Continue to progress workloads to maintain intensity without signs/symptoms of physical distress.      Average METs -- 2.5        Resistance Training   Training Prescription -- Yes      Weight -- 4 lb      Reps -- 10-15        Recumbant Bike   Level -- 2      Watts -- 29      Minutes -- 15      METs -- 2.8        T5 Nustep   Level -- 3      Minutes -- 15      METs -- 2.1               Exercise Comments:   Exercise Comments     Row Name 10/08/21 1619           Exercise Comments First full day  of exercise!  Patient was oriented to gym and equipment including functions, settings, policies, and procedures.  Patient's individual exercise prescription and treatment plan were reviewed.  All starting workloads were established based on the results of the 6 minute walk test done at initial orientation visit.  The plan for exercise progression was also introduced and progression will be customized based on patient's performance and goals.                Exercise Goals and Review:   Exercise Goals     Row Name 10/01/21 1633             Exercise Goals   Increase Physical Activity Yes       Intervention Provide advice, education, support and counseling about physical activity/exercise needs.;Develop an individualized exercise prescription for aerobic and resistive training based on initial evaluation findings, risk stratification, comorbidities and participant's personal goals.       Expected Outcomes  Short Term: Attend rehab on a regular basis to increase amount of physical activity.;Long Term: Add in home exercise to make exercise part of routine and to increase amount of physical activity.;Long Term: Exercising regularly at least 3-5 days a week.       Increase Strength and Stamina Yes       Intervention Provide advice, education, support and counseling about physical activity/exercise needs.;Develop an individualized exercise prescription for aerobic and resistive training based on initial evaluation findings, risk stratification, comorbidities and participant's personal goals.       Expected Outcomes Short Term: Increase workloads from initial exercise prescription for resistance, speed, and METs.;Short Term: Perform resistance training exercises routinely during rehab and add in resistance training at home;Long Term: Improve cardiorespiratory fitness, muscular endurance and strength as measured by increased METs and functional capacity (6MWT)       Able to understand and use rate of perceived exertion (RPE) scale Yes       Intervention Provide education and explanation on how to use RPE scale       Expected Outcomes Short Term: Able to use RPE daily in rehab to express subjective intensity level;Long Term:  Able to use RPE to guide intensity level when exercising independently       Able to understand and use Dyspnea scale Yes       Intervention Provide education and explanation on how to use Dyspnea scale       Expected Outcomes Short Term: Able to use Dyspnea scale daily in rehab to express subjective sense of shortness of breath during exertion;Long Term: Able to use Dyspnea scale to guide intensity level when exercising independently       Knowledge and understanding of Target Heart Rate Range (THRR) Yes       Intervention Provide education and explanation of THRR including how the numbers were predicted and where they are located for reference       Expected Outcomes Short Term: Able to  state/look up THRR;Long Term: Able to use THRR to govern intensity when exercising independently;Short Term: Able to use daily as guideline for intensity in rehab       Able to check pulse independently Yes       Intervention Provide education and demonstration on how to check pulse in carotid and radial arteries.;Review the importance of being able to check your own pulse for safety during independent exercise       Expected Outcomes Short Term: Able to explain why pulse checking is important during independent exercise;Long Term:  Able to check pulse independently and accurately       Understanding of Exercise Prescription Yes       Intervention Provide education, explanation, and written materials on patient's individual exercise prescription       Expected Outcomes Short Term: Able to explain program exercise prescription;Long Term: Able to explain home exercise prescription to exercise independently                Exercise Goals Re-Evaluation :  Exercise Goals Re-Evaluation     [place] Name 10/08/21 1619             Exercise Goal Re-Evaluation   Exercise Goals Review Increase Physical Activity;Able to understand and use rate of perceived exertion (RPE) scale;Knowledge and understanding of Target Heart Rate Range (THRR);Understanding of Exercise Prescription;Increase Strength and Stamina;Able to understand and use Dyspnea scale;Able to check pulse independently       Comments Reviewed RPE and dyspnea scales, THR and program prescription with pt today.  Pt voiced understanding and was given a copy of goals to take home.       Expected Outcomes Short: Use RPE daily to regulate intensity. Long: Follow program prescription in THR.                Discharge Exercise Prescription (Final Exercise Prescription Changes):  Exercise Prescription Changes - 10/15/21 1500       Response to Exercise   Blood Pressure (Admit) 102/64    Blood Pressure (Exercise) 122/60    Blood Pressure (Exit)  122/62    Heart Rate (Admit) 85 bpm    Heart Rate (Exercise) 94 bpm    Heart Rate (Exit) 68 bpm    Rating of Perceived Exertion (Exercise) 11    Symptoms none    Comments first day    Duration Progress to 30 minutes of  aerobic without signs/symptoms of physical distress    Intensity THRR unchanged      Progression   Progression Continue to progress workloads to maintain intensity without signs/symptoms of physical distress.    Average METs 2.5      Resistance Training   Training Prescription Yes    Weight 4 lb    Reps 10-15      Recumbant Bike   Level 2    Watts 29    Minutes 15    METs 2.8      T5 Nustep   Level 3    Minutes 15    METs 2.1             Nutrition:  Target Goals: Understanding of nutrition guidelines, daily intake of sodium 1500mg , cholesterol 200mg , calories 30% from fat and 7% or less from saturated fats, daily to have 5 or more servings of fruits and vegetables.  Education: All About Nutrition: -Group instruction provided by verbal, written material, interactive activities, discussions, models, and posters to present general guidelines for heart healthy nutrition including fat, fiber, MyPlate, the role of sodium in heart healthy nutrition, utilization of the nutrition label, and utilization of this knowledge for meal planning. Follow up email sent as well. Written material given at graduation. Flowsheet Row Cardiac Rehab from 10/01/2021 in [facility] Cardiac and Pulmonary Rehab  Education need identified 10/01/21       Biometrics:  Pre Biometrics - 10/01/21 1634       Pre Biometrics   Height 5' 10.75" (1.797 m)    Weight 237 lb 8 oz (107.7 kg)    BMI (Calculated) 33.36  Single Leg Stand 30 seconds              Nutrition Therapy Plan and Nutrition Goals:  Nutrition Therapy & Goals - 10/01/21 1641       Intervention Plan   Intervention Prescribe, educate and counsel regarding individualized specific dietary modifications aiming  towards targeted core components such as weight, hypertension, lipid management, diabetes, heart failure and other comorbidities.    Expected Outcomes Short Term Goal: Understand basic principles of dietary content, such as calories, fat, sodium, cholesterol and nutrients.;Short Term Goal: A plan has been developed with personal nutrition goals set during dietitian appointment.;Long Term Goal: Adherence to prescribed nutrition plan.             Nutrition Assessments:  MEDIFICTS Score Key: ?70 Need to make dietary changes  40-70 Heart Healthy Diet ? 40 Therapeutic Level Cholesterol Diet  Flowsheet Row Cardiac Rehab from 10/01/2021 in [facility] Cardiac and Pulmonary Rehab  Picture Your Plate Total Score on Admission 62      Picture Your Plate Scores: <03 Unhealthy dietary pattern with much room for improvement. 41-50 Dietary pattern unlikely to meet recommendations for good health and room for improvement. 51-60 More healthful dietary pattern, with some room for improvement.  >60 Healthy dietary pattern, although there may be some specific behaviors that could be improved.    Nutrition Goals Re-Evaluation:   Nutrition Goals Discharge (Final Nutrition Goals Re-Evaluation):   Psychosocial: Target Goals: Acknowledge presence or absence of significant depression and/or stress, maximize coping skills, provide positive support system. Participant is able to verbalize types and ability to use techniques and skills needed for reducing stress and depression.   Education: Stress, Anxiety, and Depression - Group verbal and visual presentation to define topics covered.  Reviews how body is impacted by stress, anxiety, and depression.  Also discusses healthy ways to reduce stress and to treat/manage anxiety and depression.  Written material given at graduation.   Education: Sleep Hygiene -Provides group verbal and written instruction about how sleep can affect your health.  Define sleep  hygiene, discuss sleep cycles and impact of sleep habits. Review good sleep hygiene tips.    Initial Review & Psychosocial Screening:  Initial Psych Review & Screening - 09/21/21 1406       Initial Review   Current issues with Current Anxiety/Panic      Family Dynamics   Good Support System? Yes   wife     Barriers   Psychosocial barriers to participate in program There are no identifiable barriers or psychosocial needs.;The patient should benefit from training in stress management and relaxation.      Screening Interventions   Interventions Encouraged to exercise;Provide feedback about the scores to participant;To provide support and resources with identified psychosocial needs    Expected Outcomes Short Term goal: Utilizing psychosocial counselor, staff and physician to assist with identification of specific Stressors or current issues interfering with healing process. Setting desired goal for each stressor or current issue identified.;Long Term Goal: Stressors or current issues are controlled or eliminated.;Short Term goal: Identification and review with participant of any Quality of Life or Depression concerns found by scoring the questionnaire.;Long Term goal: The participant improves quality of Life and PHQ9 Scores as seen by post scores and/or verbalization of changes             Quality of Life Scores:   Quality of Life - 10/01/21 1639       Quality of Life   Select Quality of  Life      Quality of Life Scores   Health/Function Pre 18.17 %    Socioeconomic Pre 18.13 %    Psych/Spiritual Pre 20.36 %    Family Pre 21 %    GLOBAL Pre 19 %            Scores of 19 and below usually indicate a poorer quality of life in these areas.  A difference of  2-3 points is a clinically meaningful difference.  A difference of 2-3 points in the total score of the Quality of Life Index has been associated with significant improvement in overall quality of life, self-image, physical  symptoms, and general health in studies assessing change in quality of life.  PHQ-9: Recent Review Flowsheet Data     Depression screen [facility] 2/9 10/01/2021   Decreased Interest 0   Down, Depressed, Hopeless 1   PHQ - 2 Score 1   Altered sleeping 2   Tired, decreased energy 1   Change in appetite 2   Feeling bad or failure about yourself  2   Trouble concentrating 0   Moving slowly or fidgety/restless 0   Suicidal thoughts 0   PHQ-9 Score 8   Difficult doing work/chores Not difficult at all      Interpretation of Total Score  Total Score Depression Severity:  1-4 = Minimal depression, 5-9 = Mild depression, 10-14 = Moderate depression, 15-19 = Moderately severe depression, 20-27 = Severe depression   Psychosocial Evaluation and Intervention:  Psychosocial Evaluation - 09/21/21 1417       Psychosocial Evaluation & Interventions   Interventions Encouraged to exercise with the program and follow exercise prescription    Comments Doyle reports feeling well after his STEMI. He is easing back into work and not overdoing it. He has had a kidney transplant in the past which causes no issues. He does report some anxiety related to his cpap and claustrophobia, so he takes medicine at night prior to placing mask. He is trying to get fitted for a new cpap so he is scheduling a sleep study. His wife is his main support system and they both are looking forward to the education aspect of the program    Expected Outcomes Short: attend cardiac rehab for education and exercise. Long: develop and maintain positive self care habits.    Continue Psychosocial Services  Follow up required by staff             Psychosocial Re-Evaluation:   Psychosocial Discharge (Final Psychosocial Re-Evaluation):   Vocational Rehabilitation: Provide vocational rehab assistance to qualifying candidates.   Vocational Rehab Evaluation & Intervention:  Vocational Rehab - 09/21/21 1406       Initial  Vocational Rehab Evaluation & Intervention   Assessment shows need for Vocational Rehabilitation No             Education: Education Goals: Education classes will be provided on a variety of topics geared toward better understanding of heart health and risk factor modification. Participant will state understanding/return demonstration of topics presented as noted by education test scores.  Learning Barriers/Preferences:  Learning Barriers/Preferences - 09/21/21 1406       Learning Barriers/Preferences   Learning Barriers None    Learning Preferences None             General Cardiac Education Topics:  AED/CPR: - Group verbal and written instruction with the use of models to demonstrate the basic use of the AED with the basic ABC's of resuscitation.  Anatomy and Cardiac Procedures: - Group verbal and visual presentation and models provide information about basic cardiac anatomy and function. Reviews the testing methods done to diagnose heart disease and the outcomes of the test results. Describes the treatment choices: Medical Management, Angioplasty, or Coronary Bypass Surgery for treating various heart conditions including Myocardial Infarction, Angina, Valve Disease, and Cardiac Arrhythmias.  Written material given at graduation. Flowsheet Row Cardiac Rehab from 10/01/2021 in [facility] Cardiac and Pulmonary Rehab  Education need identified 10/01/21       Medication Safety: - Group verbal and visual instruction to review commonly prescribed medications for heart and lung disease. Reviews the medication, class of the drug, and side effects. Includes the steps to properly store meds and maintain the prescription regimen.  Written material given at graduation.   Intimacy: - Group verbal instruction through game format to discuss how heart and lung disease can affect sexual intimacy. Written material given at graduation..   Know Your Numbers and Heart Failure: - Group verbal  and visual instruction to discuss disease risk factors for cardiac and pulmonary disease and treatment options.  Reviews associated critical values for Overweight/Obesity, Hypertension, Cholesterol, and Diabetes.  Discusses basics of heart failure: signs/symptoms and treatments.  Introduces Heart Failure Zone chart for action plan for heart failure.  Written material given at graduation.   Infection Prevention: - Provides verbal and written material to individual with discussion of infection control including proper hand washing and proper equipment cleaning during exercise session. Flowsheet Row Cardiac Rehab from 10/01/2021 in [facility] Cardiac and Pulmonary Rehab  Date 10/01/21  Educator [facility]  Instruction Review Code 1- Verbalizes Understanding       Falls Prevention: - Provides verbal and written material to individual with discussion of falls prevention and safety. Flowsheet Row Cardiac Rehab from 10/01/2021 in [facility]  Date 10/01/21  Educator [facility]  Instruction Review Code 1- Verbalizes Understanding       Other: -Provides group and verbal instruction on various topics (see comments)   Knowledge Questionnaire Score:  Knowledge Questionnaire Score - 10/01/21 1639       Knowledge Questionnaire Score   Pre Score 24/26             Core Components/Risk Factors/Patient Goals at Admission:  Personal Goals and Risk Factors at Admission - 10/01/21 1636       Core Components/Risk Factors/Patient Goals on Admission    Weight Management Yes    Intervention Weight Management: Develop a combined nutrition and exercise program designed to reach desired caloric intake, while maintaining appropriate intake of nutrient and fiber, sodium and fats, and appropriate energy expenditure required for the weight goal.;Weight Management: Provide education and appropriate resources to help participant work on and attain dietary goals.;Obesity: Provide education and appropriate  resources to help participant work on and attain dietary goals.    Admit Weight 237 lb 8 oz (107.7 kg)    Goal Weight: Short Term 232 lb (105.2 kg)    Goal Weight: Long Term 220 lb (99.8 kg)    Expected Outcomes Weight Loss: Understanding of general recommendations for a balanced deficit meal plan, which promotes 1-2 lb weight loss per week and includes a negative energy balance of [id number] kcal/d;Long Term: Adherence to nutrition and physical activity/exercise program aimed toward attainment of established weight goal;Short Term: Continue to assess and modify interventions until short term weight is achieved;Understanding recommendations for meals to include 15-35% energy as protein, 25-35% energy from fat, 35-60% energy from carbohydrates,  less than 200mg  of dietary cholesterol, 20-35 gm of total fiber daily;Understanding of distribution of calorie intake throughout the day with the consumption of 4-5 meals/snacks    Tobacco Cessation Yes   recently quit within that last 6 months   Number of packs per day 0    Expected Outcomes Long Term: Complete abstinence from all tobacco products for at least 12 months from quit date.    Hypertension Yes    Intervention Provide education on lifestyle modifcations including regular physical activity/exercise, weight management, moderate sodium restriction and increased consumption of fresh fruit, vegetables, and low fat dairy, alcohol moderation, and smoking cessation.;Monitor prescription use compliance.    Expected Outcomes Short Term: Continued assessment and intervention until BP is < 140/73mm HG in hypertensive participants. < 130/70mm HG in hypertensive participants with diabetes, heart failure or chronic kidney disease.;Long Term: Maintenance of blood pressure at goal levels.    Lipids Yes    Intervention Provide education and support for participant on nutrition & aerobic/resistive exercise along with prescribed medications to achieve LDL 70mg , HDL >40mg .     Expected Outcomes Short Term: Participant states understanding of desired cholesterol values and is compliant with medications prescribed. Participant is following exercise prescription and nutrition guidelines.;Long Term: Cholesterol controlled with medications as prescribed, with individualized exercise RX and with personalized nutrition plan. Value goals: LDL < 70mg , HDL > 40 mg.             Education:Diabetes - Individual verbal and written instruction to review signs/symptoms of diabetes, desired ranges of glucose level fasting, after meals and with exercise. Acknowledge that pre and post exercise glucose checks will be done for 3 sessions at entry of program.   Core Components/Risk Factors/Patient Goals Review:    Core Components/Risk Factors/Patient Goals at Discharge (Final Review):    ITP Comments:  ITP Comments     Row Name 09/21/21 1416 10/01/21 1624 10/08/21 1619 10/24/21 0713     ITP Comments Initial telephone orientation completed. Diagnosis can be found in [facility] 9/5. EP orientation scheduled for Monday 10/10 at 3pm. Completed 6MWT and gym orientation. Initial ITP created and sent for review to Dr. Emily Filbert, Medical Director. First full day of exercise!  Patient was oriented to gym and equipment including functions, settings, policies, and procedures.  Patient's individual exercise prescription and treatment plan were reviewed.  All starting workloads were established based on the results of the 6 minute walk test done at initial orientation visit.  The plan for exercise progression was also introduced and progression will be customized based on patient's performance and goals. 30 Day review completed. Medical Director ITP review done, changes made as directed, and signed approval by Medical Director.   New to program             Comments:  30 Day review completed. Medical Director ITP review done, changes made as directed, and signed approval by Medical Director.

## 2021-10-29 ENCOUNTER — Telehealth: Payer: Self-pay

## 2021-10-29 DIAGNOSIS — Z955 Presence of coronary angioplasty implant and graft: Secondary | ICD-10-CM

## 2021-10-29 DIAGNOSIS — I213 ST elevation (STEMI) myocardial infarction of unspecified site: Secondary | ICD-10-CM

## 2021-10-29 NOTE — Telephone Encounter (Signed)
Attempted to call patient to follow up on [facility] and getting clearance to come back. Left message asking for call back.

## 2021-10-30 ENCOUNTER — Other Ambulatory Visit: Payer: Self-pay

## 2021-10-30 ENCOUNTER — Ambulatory Visit (INDEPENDENT_AMBULATORY_CARE_PROVIDER_SITE_OTHER): Payer: BC Managed Care – PPO | Admitting: Internal Medicine

## 2021-10-30 ENCOUNTER — Encounter: Payer: Self-pay | Admitting: *Deleted

## 2021-10-30 ENCOUNTER — Encounter: Payer: Self-pay | Admitting: Internal Medicine

## 2021-10-30 VITALS — BP 161/100 | HR 79 | Temp 98.6°F | Ht 70.0 in | Wt 234.3 lb

## 2021-10-30 DIAGNOSIS — I213 ST elevation (STEMI) myocardial infarction of unspecified site: Secondary | ICD-10-CM

## 2021-10-30 DIAGNOSIS — J069 Acute upper respiratory infection, unspecified: Secondary | ICD-10-CM | POA: Diagnosis not present

## 2021-10-30 DIAGNOSIS — G4733 Obstructive sleep apnea (adult) (pediatric): Secondary | ICD-10-CM | POA: Diagnosis not present

## 2021-10-30 DIAGNOSIS — N138 Other obstructive and reflux uropathy: Secondary | ICD-10-CM

## 2021-10-30 DIAGNOSIS — Z6833 Body mass index (BMI) 33.0-33.9, adult: Secondary | ICD-10-CM

## 2021-10-30 DIAGNOSIS — N401 Enlarged prostate with lower urinary tract symptoms: Secondary | ICD-10-CM

## 2021-10-30 DIAGNOSIS — E662 Morbid (severe) obesity with alveolar hypoventilation: Secondary | ICD-10-CM

## 2021-10-30 DIAGNOSIS — Z955 Presence of coronary angioplasty implant and graft: Secondary | ICD-10-CM

## 2021-10-30 LAB — POC COVID19 BINAXNOW: SARS Coronavirus 2 Ag: NEGATIVE

## 2021-10-30 LAB — POCT INFLUENZA A/B
Influenza A, POC: NEGATIVE
Influenza B, POC: NEGATIVE

## 2021-10-30 NOTE — Assessment & Plan Note (Signed)
Chronic problem. 

## 2021-10-30 NOTE — Progress Notes (Signed)
Established Patient Office Visit  Subjective:  Patient ID: Chad Mccann, male    DOB: March 30, 1966  Age: 55 y.o. MRN: [id number]  CC:  Chief Complaint  Patient presents with   URI    Patient complains of having cough, chills,congestion, and fever     URI    Chad Mccann presents for respiratory infection.,  COVID test is negative.  Past Medical History:  Diagnosis Date   Benign prostatic hypertrophy    Bladder spasm    Erectile dysfunction    Frequency    GERD (gastroesophageal reflux disease)    Hypertension    Hypogonadism in male    Kidney 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    Kidney, malrotation    MGUS (monoclonal gammopathy of unknown significance)    Monoclonal paraproteinemia    Obesity, unspecified    Other specified congenital anomaly of kidney    Other specified disorders of bladder    Other testicular hypofunction    Premature ejaculation    Sleep apnea    Ventral hernia     Past Surgical History:  Procedure Laterality Date   BACK SURGERY     c5/6 fusion after discectomy 3 weeks prior   COLONOSCOPY     polyp removal   CORONARY/GRAFT ACUTE MI REVASCULARIZATION N/A 08/27/2021   Procedure: Coronary/Graft Acute MI Revascularization;  Surgeon: Wellington Hampshire, MD;  Location: [facility];  Service: Cardiovascular;  Laterality: N/A;   dialysis port placement     and removal   HERNIA REPAIR  12/29/13   ventral   KIDNEY TRANSPLANT  01/2012   LEFT HEART CATH AND CORONARY ANGIOGRAPHY N/A 08/27/2021   Procedure: LEFT HEART CATH AND CORONARY ANGIOGRAPHY;  Surgeon: Wellington Hampshire, MD;  Location: [facility];  Service: Cardiovascular;  Laterality: N/A;   SPINE SURGERY     cervical fusion   TOTAL HIP ARTHROPLASTY Left 03/02/2019   Procedure: TOTAL HIP ARTHROPLASTY ANTERIOR APPROACH-LEFT;  Surgeon: Hessie Knows, MD;  Location: [facility];  Service: Orthopedics;  Laterality: Left;    Family History  Problem Relation Age of Onset   Colon cancer Father  85   Heart attack Father    Stroke Brother    Colon cancer Maternal Grandfather    Kidney disease Neg Hx    Prostate cancer Neg Hx    Kidney cancer Neg Hx    Bladder Cancer Neg Hx     Social History   Socioeconomic History   Marital status: Married    Spouse name: Not on file   Number of children: Not on file   Years of education: Not on file   Highest education level: Not on file  Occupational History   Occupation: Hotel manager  Tobacco Use   Smoking status: Former    Types: Cigars    Quit date: 08/27/2021    Years since quitting: 0.1   Smokeless tobacco: Never  Substance and Sexual Activity   Alcohol use: Yes    Alcohol/week: 0.0 standard drinks    Comment: occ   Drug use: No   Sexual activity: Yes  Other Topics Concern   Not on file  Social History Narrative   Not on file   Social Determinants of Health   Financial Resource Strain: Not on file  Food Insecurity: Not on file  Transportation Needs: Not on file  Physical Activity: Not on file  Stress: Not on file  Social Connections: Not on file  Intimate Partner Violence: Not on  file     Current Outpatient Medications:    acetaminophen (TYLENOL) 325 MG tablet, Take 1-2 tablets (325-650 mg total) by mouth every 6 (six) hours as needed for mild pain (pain score 1-3 or temp > 100.5)., Disp: , Rfl:    aspirin 81 MG chewable tablet, Chew 1 tablet (81 mg total) by mouth daily., Disp: 30 tablet, Rfl: 11   carvedilol (COREG) 6.25 MG tablet, Take 1 tablet (6.25 mg total) by mouth 2 (two) times daily with a meal., Disp: 60 tablet, Rfl: 3   clonazePAM (KLONOPIN) 0.5 MG tablet, Take 0.5 mg by mouth at bedtime., Disp: , Rfl:    mycophenolate (CELLCEPT) 250 MG capsule, Take 750 mg by mouth 2 (two) times daily., Disp: , Rfl:    nitroGLYCERIN (NITROSTAT) 0.4 MG SL tablet, Place 1 tablet (0.4 mg total) under the tongue every 5 (five) minutes as needed for chest pain., Disp: 30 tablet, Rfl: 12   prasugrel (EFFIENT) 10  MG TABS tablet, Take 1 tablet (10 mg total) by mouth daily., Disp: 30 tablet, Rfl: 10   rosuvastatin (CRESTOR) 40 MG tablet, Take 1 tablet (40 mg total) by mouth daily., Disp: 30 tablet, Rfl: 3   tacrolimus (PROGRAF) 1 MG capsule, Take 2 mg by mouth 2 (two) times daily., Disp: , Rfl:    tadalafil (CIALIS) 20 MG tablet, Take 1 tablet (20 mg total) by mouth daily as needed for erectile dysfunction., Disp: 6 tablet, Rfl: 11   telmisartan (MICARDIS) 20 MG tablet, Take 20 mg by mouth daily., Disp: , Rfl:    testosterone cypionate (DEPOTESTOSTERONE CYPIONATE) 200 MG/ML injection, Inject 0.75  mL into the muscle every 14 days, Disp: 10 mL, Rfl: 0   Allergies  Allergen Reactions   Hydralazine Other (See Comments)    Nose bleeds/light headed   Morphine And Related Other (See Comments)    "difficulty waking patient"   Prednisone     hallucinations   Shellfish Allergy Nausea And Vomiting    ROS Review of Systems  Constitutional: Negative.   HENT: Negative.    Eyes: Negative.   Respiratory: Negative.    Cardiovascular: Negative.   Gastrointestinal: Negative.   Endocrine: Negative.   Genitourinary: Negative.   Musculoskeletal: Negative.   Skin: Negative.   Allergic/Immunologic: Negative.   Neurological: Negative.   Hematological: Negative.   Psychiatric/Behavioral: Negative.    All other systems reviewed and are negative.    Objective:    Physical Exam Vitals reviewed.  Constitutional:      Appearance: Normal appearance.  HENT:     Mouth/Throat:     Mouth: Mucous membranes are moist.  Eyes:     Pupils: Pupils are equal, round, and reactive to light.  Neck:     Vascular: No carotid bruit.  Cardiovascular:     Rate and Rhythm: Normal rate and regular rhythm.     Pulses: Normal pulses.     Heart sounds: Normal heart sounds.  Pulmonary:     Effort: Pulmonary effort is normal.     Breath sounds: Normal breath sounds.  Abdominal:     General: Bowel sounds are normal.      Palpations: Abdomen is soft. There is no hepatomegaly, splenomegaly or mass.     Tenderness: There is no abdominal tenderness.     Hernia: No hernia is present.  Musculoskeletal:     Cervical back: Neck supple.     Right lower leg: No edema.     Left lower leg: No edema.  Skin:    Findings: No rash.  Neurological:     Mental Status: He is alert and oriented to person, place, and time.     Motor: No weakness.  Psychiatric:        Mood and Affect: Mood normal.        Behavior: Behavior normal.    BP (!) 161/100   Pulse 79   Temp 98.6 F (37 C)   Ht '5\' 10"'  (1.778 m)   Wt 234 lb 4.8 oz (106.3 kg)   SpO2 96%   BMI 33.62 kg/m  Wt Readings from Last 3 Encounters:  10/30/21 234 lb 4.8 oz (106.3 kg)  10/09/21 239 lb (108.4 kg)  10/01/21 237 lb 8 oz (107.7 kg)     Health Maintenance Due  Topic Date Due   COVID-19 Vaccine (1) Never done   HIV Screening  Never done   Hepatitis C Screening  Never done   Zoster Vaccines- Shingrix (1 of 2) Never done   COLONOSCOPY (Pts 45-59yr Insurance coverage will need to be confirmed)  Never done   Pneumococcal Vaccine [id number] old (2 - PPSV23 if available, else PCV20) 03/13/2017   INFLUENZA VACCINE  Never done    There are no preventive care reminders to display for this patient.  Lab Results  Component Value Date   TSH 2.290 11/28/2015   Lab Results  Component Value Date   WBC 4.9 10/16/2021   HGB 18.0 (H) 10/16/2021   HCT 49.5 10/16/2021   MCV 91.7 10/16/2021   PLT 213 10/16/2021   Lab Results  Component Value Date   NA 134 (L) 10/16/2021   K 4.2 10/16/2021   CO2 26 10/16/2021   GLUCOSE 183 (H) 10/16/2021   BUN 23 (H) 10/16/2021   CREATININE 1.40 (H) 10/16/2021   BILITOT 1.4 (H) 10/16/2021   ALKPHOS 60 10/16/2021   AST 17 10/16/2021   ALT 18 10/16/2021   PROT 8.0 10/16/2021   ALBUMIN 4.8 10/16/2021   CALCIUM 9.2 10/16/2021   ANIONGAP 7 10/16/2021   EGFR 70 09/10/2021   Lab Results  Component Value Date   CHOL  89 (L) 10/09/2021   Lab Results  Component Value Date   HDL 27 (L) 10/09/2021   Lab Results  Component Value Date   LDLCALC 41 10/09/2021   Lab Results  Component Value Date   TRIG 111 10/09/2021   Lab Results  Component Value Date   CHOLHDL 3.3 10/09/2021   Lab Results  Component Value Date   HGBA1C 5.5 08/27/2021      Assessment & Plan:   Problem List Items Addressed This Visit       Respiratory   Obstructive apnea    Chronic problem      Upper respiratory tract infection - Primary    Patient complains of chest cold and upper respiratory infection.  He feels bad denies any chest pain but has been coughing a lot.  He is negative for COVID negative for flu.  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  - I educated them on making healthy dietary choices including eating more fruits and vegetables and less fried foods. - I encouraged the patient to exercise more, and educated on the benefits of exercise including weight loss, diabetes prevention, and hypertension prevention.   Dietary counseling with a registered dietician  Referral to a weight management support group (e.g. Weight Watchers, Overeaters Anonymous)  If your BMI is greater than 29 or you have gained more than 15 pounds you should work on weight loss.  Attend a healthy cooking class        No orders of the defined types were placed in this encounter.   Follow-up: No follow-ups on file.    [name], MD

## 2021-10-30 NOTE — Assessment & Plan Note (Signed)
Patient complains of chest cold and upper respiratory infection.  He feels bad denies any chest pain but has been coughing a lot.  He is negative for COVID negative for flu.  He was started on Tussionex cough syrup and prednisone taper 4 mg was also given Z-Pak as directed

## 2021-10-30 NOTE — Assessment & Plan Note (Signed)
Stable at the present time. 

## 2021-10-30 NOTE — Assessment & Plan Note (Signed)

## 2021-11-05 ENCOUNTER — Telehealth: Payer: Self-pay

## 2021-11-05 NOTE — Telephone Encounter (Signed)
Left another message for patient for Cardiac Rehab. Asking for callback.

## 2021-11-08 NOTE — Telephone Encounter (Signed)
No response back. Sent letter to patient.

## 2021-11-14 DIAGNOSIS — Z955 Presence of coronary angioplasty implant and graft: Secondary | ICD-10-CM

## 2021-11-14 DIAGNOSIS — I213 ST elevation (STEMI) myocardial infarction of unspecified site: Secondary | ICD-10-CM

## 2021-11-14 NOTE — Progress Notes (Signed)
Pt has not attended since last review.  Staff sent discharge letter

## 2021-11-21 ENCOUNTER — Encounter: Payer: Self-pay | Admitting: *Deleted

## 2021-11-21 DIAGNOSIS — I213 ST elevation (STEMI) myocardial infarction of unspecified site: Secondary | ICD-10-CM

## 2021-11-21 DIAGNOSIS — Z955 Presence of coronary angioplasty implant and graft: Secondary | ICD-10-CM

## 2021-11-21 NOTE — Progress Notes (Signed)
Cardiac Individual Treatment Plan  Patient Details  Name: Chad Mccann MRN: [id number] Date of Birth: June 15, 1966 Referring Provider:   Flowsheet Row Cardiac Rehab from 10/01/2021 in [facility] Cardiac and Pulmonary Rehab  Referring Provider Arida       Initial Encounter Date:  Flowsheet Row Cardiac Rehab from 10/01/2021 in [facility] Cardiac and Pulmonary Rehab  Date 10/01/21       Visit Diagnosis: ST elevation myocardial infarction (STEMI), unspecified artery [facility])  Status post coronary artery stent placement  Patient's Home Medications on Admission:  Current Outpatient Medications:    acetaminophen (TYLENOL) 325 MG tablet, Take 1-2 tablets (325-650 mg total) by mouth every 6 (six) hours as needed for mild pain (pain score 1-3 or temp > 100.5)., Disp: , Rfl:    aspirin 81 MG chewable tablet, Chew 1 tablet (81 mg total) by mouth daily., Disp: 30 tablet, Rfl: 11   carvedilol (COREG) 6.25 MG tablet, Take 1 tablet (6.25 mg total) by mouth 2 (two) times daily with a meal., Disp: 60 tablet, Rfl: 3   clonazePAM (KLONOPIN) 0.5 MG tablet, Take 0.5 mg by mouth at bedtime., Disp: , Rfl:    mycophenolate (CELLCEPT) 250 MG capsule, Take 750 mg by mouth 2 (two) times daily., Disp: , Rfl:    nitroGLYCERIN (NITROSTAT) 0.4 MG SL tablet, Place 1 tablet (0.4 mg total) under the tongue every 5 (five) minutes as needed for chest pain., Disp: 30 tablet, Rfl: 12   prasugrel (EFFIENT) 10 MG TABS tablet, Take 1 tablet (10 mg total) by mouth daily., Disp: 30 tablet, Rfl: 10   rosuvastatin (CRESTOR) 40 MG tablet, Take 1 tablet (40 mg total) by mouth daily., Disp: 30 tablet, Rfl: 3   tacrolimus (PROGRAF) 1 MG capsule, Take 2 mg by mouth 2 (two) times daily., Disp: , Rfl:    tadalafil (CIALIS) 20 MG tablet, Take 1 tablet (20 mg total) by mouth daily as needed for erectile dysfunction., Disp: 6 tablet, Rfl: 11   telmisartan (MICARDIS) 20 MG tablet, Take 20 mg by mouth daily., Disp: , Rfl:    testosterone cypionate  (DEPOTESTOSTERONE CYPIONATE) 200 MG/ML injection, Inject 0.75  mL into the muscle every 14 days, Disp: 10 mL, Rfl: 0  Past Medical History: Past Medical History:  Diagnosis Date   Benign prostatic hypertrophy    Bladder spasm    Erectile dysfunction    Frequency    GERD (gastroesophageal reflux disease)    Hypertension    Hypogonadism in male    Kidney 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    Kidney, malrotation    MGUS (monoclonal gammopathy of unknown significance)    Monoclonal paraproteinemia    Obesity, unspecified    Other specified congenital anomaly of kidney    Other specified disorders of bladder    Other testicular hypofunction    Premature ejaculation    Sleep apnea    Ventral hernia     Tobacco Use: Social History   Tobacco Use  Smoking Status Former   Types: Cigars   Quit date: 08/27/2021   Years since quitting: 0.2  Smokeless Tobacco Never    Labs: Recent Review Flowsheet Data     Labs for ITP Cardiac and Pulmonary Rehab Latest Ref Rng & Units 11/28/2015 12/01/2020 08/27/2021 10/09/2021   Cholestrol 100 - 199 mg/dL 130 142 143 89(L)   LDLCALC 0 - 99 mg/dL 74 90 82 41   HDL >39 mg/dL 25(L) 27(L) 28(L) 27(L)   Trlycerides 0 - 149 mg/dL 157(H) 136 166(H) 111  Hemoglobin A1c 4.8 - 5.6 % - - 5.5 -        Exercise Target Goals: Exercise Program Goal: Individual exercise prescription set using results from initial 6 min walk test and THRR while considering  patient's activity barriers and safety.   Exercise Prescription Goal: Initial exercise prescription builds to 30-45 minutes a day of aerobic activity, 2-3 days per week.  Home exercise guidelines will be given to patient during program as part of exercise prescription that the participant will acknowledge.   Education: Aerobic Exercise: - Group verbal and visual presentation on the components of exercise prescription. Introduces F.I.T.T principle from ACSM for exercise prescriptions.  Reviews F.I.T.T. principles of  aerobic exercise including progression. Written material given at graduation.   Education: Resistance Exercise: - Group verbal and visual presentation on the components of exercise prescription. Introduces F.I.T.T principle from ACSM for exercise prescriptions  Reviews F.I.T.T. principles of resistance exercise including progression. Written material given at graduation.    Education: Exercise & Equipment Safety: - Individual verbal instruction and demonstration of equipment use and safety with use of the equipment. Flowsheet Row Cardiac Rehab from 10/01/2021 in [facility] Cardiac and Pulmonary Rehab  Date 10/01/21  Educator [facility]  Instruction Review Code 1- Verbalizes Understanding       Education: Exercise Physiology & General Exercise Guidelines: - Group verbal and written instruction with models to review the exercise physiology of the cardiovascular system and associated critical values. Provides general exercise guidelines with specific guidelines to those with heart or lung disease.    Education: Flexibility, Balance, Mind/Body Relaxation: - Group verbal and visual presentation with interactive activity on the components of exercise prescription. Introduces F.I.T.T principle from ACSM for exercise prescriptions. Reviews F.I.T.T. principles of flexibility and balance exercise training including progression. Also discusses the mind body connection.  Reviews various relaxation techniques to help reduce and manage stress (i.e. Deep breathing, progressive muscle relaxation, and visualization). Balance handout provided to take home. Written material given at graduation.   Activity Barriers & Risk Stratification:  Activity Barriers & Cardiac Risk Stratification - 09/21/21 1404       Activity Barriers & Cardiac Risk Stratification   Activity Barriers Left Hip Replacement;Back Problems;Neck/Spine Problems    Cardiac Risk Stratification Moderate             6 Minute Walk:  6 Minute Walk      Row Name 10/01/21 1625         6 Minute Walk   Phase Initial     Distance 1555 feet     Walk Time 6 minutes     # of Rest Breaks 0     MPH 2.95     METS 3.84     RPE 9     VO2 Peak 13.44     Symptoms No     Resting HR 61 bpm     Resting BP 138/82     Resting Oxygen Saturation  96 %     Exercise Oxygen Saturation  during 6 min walk 98 %     Max Ex. HR 92 bpm     Max Ex. BP 150/80     2 Minute Post BP 126/72              Oxygen Initial Assessment:   Oxygen Re-Evaluation:   Oxygen Discharge (Final Oxygen Re-Evaluation):   Initial Exercise Prescription:  Initial Exercise Prescription - 10/01/21 1600       Date of Initial Exercise RX and  Referring Provider   Date 10/01/21    Referring Provider Arida      Oxygen   Maintain Oxygen Saturation 88% or higher      Treadmill   MPH 2.7    Grade 0.5    Minutes 15    METs 3.25      Recumbant Bike   Level 2    RPM 60    Watts 30    Minutes 15    METs 3.8      Elliptical   Level 1    Speed 3    Minutes 15      REL-XR   Level 3    Speed 50    Minutes 15    METs 3.8      Prescription Details   Frequency (times per week) 2    Duration Progress to 30 minutes of continuous aerobic without signs/symptoms of physical distress      Intensity   THRR 40-80% of Max Heartrate 102-143    Ratings of Perceived Exertion 11-13    Perceived Dyspnea 0-4      Progression   Progression Continue to progress workloads to maintain intensity without signs/symptoms of physical distress.      Resistance Training   Training Prescription Yes    Weight 4    Reps 10-15             Perform Capillary Blood Glucose checks as needed.  Exercise Prescription Changes:   Exercise Prescription Changes     Row Name 10/01/21 1600 10/15/21 1500 10/30/21 1500         Response to Exercise   Blood Pressure (Admit) 138/82 102/64 124/62     Blood Pressure (Exercise) 150/80 122/60 134/64     Blood Pressure (Exit) 126/72  122/62 122/68     Heart Rate (Admit) 61 bpm 85 bpm 68 bpm     Heart Rate (Exercise) 92 bpm 94 bpm 96 bpm     Heart Rate (Exit) 69 bpm 68 bpm 60 bpm     Oxygen Saturation (Admit) 96 % -- --     Oxygen Saturation (Exercise) 98 % -- --     Rating of Perceived Exertion (Exercise) 9 11 11      Symptoms none none none     Comments walk test results first day second full day of exercise     Duration -- Progress to 30 minutes of  aerobic without signs/symptoms of physical distress Progress to 30 minutes of  aerobic without signs/symptoms of physical distress     Intensity -- THRR unchanged THRR unchanged       Progression   Progression -- Continue to progress workloads to maintain intensity without signs/symptoms of physical distress. Continue to progress workloads to maintain intensity without signs/symptoms of physical distress.     Average METs -- 2.5 3.18       Resistance Training   Training Prescription -- Yes Yes     Weight -- 4 lb 3 lb     Reps -- 10-15 10-15       Interval Training   Interval Training -- -- No       Treadmill   MPH -- -- 2.7     Grade -- -- 0.5     Minutes -- -- 15     METs -- -- 3.25       Recumbant Bike   Level -- 2 --     Watts -- 29 --     Minutes --  15 --     METs -- 2.8 --       NuStep   Level -- -- 4     Minutes -- -- 15     METs -- -- 3.1       T5 Nustep   Level -- 3 --     Minutes -- 15 --     METs -- 2.1 --       Oxygen   Maintain Oxygen Saturation -- -- 88% or higher              Exercise Comments:   Exercise Comments     Row Name 10/08/21 1619           Exercise Comments First full day of exercise!  Patient was oriented to gym and equipment including functions, settings, policies, and procedures.  Patient's individual exercise prescription and treatment plan were reviewed.  All starting workloads were established based on the results of the 6 minute walk test done at initial orientation visit.  The plan for exercise  progression was also introduced and progression will be customized based on patient's performance and goals.                Exercise Goals and Review:   Exercise Goals     Row Name 10/01/21 1633             Exercise Goals   Increase Physical Activity Yes       Intervention Provide advice, education, support and counseling about physical activity/exercise needs.;Develop an individualized exercise prescription for aerobic and resistive training based on initial evaluation findings, risk stratification, comorbidities and participant's personal goals.       Expected Outcomes Short Term: Attend rehab on a regular basis to increase amount of physical activity.;Long Term: Add in home exercise to make exercise part of routine and to increase amount of physical activity.;Long Term: Exercising regularly at least 3-5 days a week.       Increase Strength and Stamina Yes       Intervention Provide advice, education, support and counseling about physical activity/exercise needs.;Develop an individualized exercise prescription for aerobic and resistive training based on initial evaluation findings, risk stratification, comorbidities and participant's personal goals.       Expected Outcomes Short Term: Increase workloads from initial exercise prescription for resistance, speed, and METs.;Short Term: Perform resistance training exercises routinely during rehab and add in resistance training at home;Long Term: Improve cardiorespiratory fitness, muscular endurance and strength as measured by increased METs and functional capacity (6MWT)       Able to understand and use rate of perceived exertion (RPE) scale Yes       Intervention Provide education and explanation on how to use RPE scale       Expected Outcomes Short Term: Able to use RPE daily in rehab to express subjective intensity level;Long Term:  Able to use RPE to guide intensity level when exercising independently       Able to understand and use  Dyspnea scale Yes       Intervention Provide education and explanation on how to use Dyspnea scale       Expected Outcomes Short Term: Able to use Dyspnea scale daily in rehab to express subjective sense of shortness of breath during exertion;Long Term: Able to use Dyspnea scale to guide intensity level when exercising independently       Knowledge and understanding of Target Heart Rate Range (THRR) Yes  Intervention Provide education and explanation of THRR including how the numbers were predicted and where they are located for reference       Expected Outcomes Short Term: Able to state/look up THRR;Long Term: Able to use THRR to govern intensity when exercising independently;Short Term: Able to use daily as guideline for intensity in rehab       Able to check pulse independently Yes       Intervention Provide education and demonstration on how to check pulse in carotid and radial arteries.;Review the importance of being able to check your own pulse for safety during independent exercise       Expected Outcomes Short Term: Able to explain why pulse checking is important during independent exercise;Long Term: Able to check pulse independently and accurately       Understanding of Exercise Prescription Yes       Intervention Provide education, explanation, and written materials on patient's individual exercise prescription       Expected Outcomes Short Term: Able to explain program exercise prescription;Long Term: Able to explain home exercise prescription to exercise independently                Exercise Goals Re-Evaluation :  Exercise Goals Re-Evaluation     Center Name 10/08/21 1619 10/30/21 1505           Exercise Goal Re-Evaluation   Exercise Goals Review Increase Physical Activity;Able to understand and use rate of perceived exertion (RPE) scale;Knowledge and understanding of Target Heart Rate Range (THRR);Understanding of Exercise Prescription;Increase Strength and Stamina;Able to  understand and use Dyspnea scale;Able to check pulse independently Increase Physical Activity;Increase Strength and Stamina;Understanding of Exercise Prescription      Comments Reviewed RPE and dyspnea scales, THR and program prescription with pt today.  Pt voiced understanding and was given a copy of goals to take home. Eulogio has only attended his first two visits.  He has been out sick recently.  We will continue to monitor his progress.      Expected Outcomes Short: Use RPE daily to regulate intensity. Long: Follow program prescription in THR. Short: Return to regular attendance Long: Continue to follow program prescription.               Discharge Exercise Prescription (Final Exercise Prescription Changes):  Exercise Prescription Changes - 10/30/21 1500       Response to Exercise   Blood Pressure (Admit) 124/62    Blood Pressure (Exercise) 134/64    Blood Pressure (Exit) 122/68    Heart Rate (Admit) 68 bpm    Heart Rate (Exercise) 96 bpm    Heart Rate (Exit) 60 bpm    Rating of Perceived Exertion (Exercise) 11    Symptoms none    Comments second full day of exercise    Duration Progress to 30 minutes of  aerobic without signs/symptoms of physical distress    Intensity THRR unchanged      Progression   Progression Continue to progress workloads to maintain intensity without signs/symptoms of physical distress.    Average METs 3.18      Resistance Training   Training Prescription Yes    Weight 3 lb    Reps 10-15      Interval Training   Interval Training No      Treadmill   MPH 2.7    Grade 0.5    Minutes 15    METs 3.25      NuStep   Level 4    Minutes  15    METs 3.1      Oxygen   Maintain Oxygen Saturation 88% or higher             Nutrition:  Target Goals: Understanding of nutrition guidelines, daily intake of sodium 1500mg , cholesterol 200mg , calories 30% from fat and 7% or less from saturated fats, daily to have 5 or more servings of fruits and  vegetables.  Education: All About Nutrition: -Group instruction provided by verbal, written material, interactive activities, discussions, models, and posters to present general guidelines for heart healthy nutrition including fat, fiber, MyPlate, the role of sodium in heart healthy nutrition, utilization of the nutrition label, and utilization of this knowledge for meal planning. Follow up email sent as well. Written material given at graduation. Flowsheet Row Cardiac Rehab from 10/01/2021 in [facility] Cardiac and Pulmonary Rehab  Education need identified 10/01/21       Biometrics:  Pre Biometrics - 10/01/21 1634       Pre Biometrics   Height 5' 10.75" (1.797 m)    Weight 237 lb 8 oz (107.7 kg)    BMI (Calculated) 33.36    Single Leg Stand 30 seconds              Nutrition Therapy Plan and Nutrition Goals:  Nutrition Therapy & Goals - 10/01/21 1641       Intervention Plan   Intervention Prescribe, educate and counsel regarding individualized specific dietary modifications aiming towards targeted core components such as weight, hypertension, lipid management, diabetes, heart failure and other comorbidities.    Expected Outcomes Short Term Goal: Understand basic principles of dietary content, such as calories, fat, sodium, cholesterol and nutrients.;Short Term Goal: A plan has been developed with personal nutrition goals set during dietitian appointment.;Long Term Goal: Adherence to prescribed nutrition plan.             Nutrition Assessments:  MEDIFICTS Score Key: ?70 Need to make dietary changes  40-70 Heart Healthy Diet ? 40 Therapeutic Level Cholesterol Diet  Flowsheet Row Cardiac Rehab from 10/01/2021 in [facility] Cardiac and Pulmonary Rehab  Picture Your Plate Total Score on Admission 62      Picture Your Plate Scores: <48 Unhealthy dietary pattern with much room for improvement. 41-50 Dietary pattern unlikely to meet recommendations for good health and room for  improvement. 51-60 More healthful dietary pattern, with some room for improvement.  >60 Healthy dietary pattern, although there may be some specific behaviors that could be improved.    Nutrition Goals Re-Evaluation:   Nutrition Goals Discharge (Final Nutrition Goals Re-Evaluation):   Psychosocial: Target Goals: Acknowledge presence or absence of significant depression and/or stress, maximize coping skills, provide positive support system. Participant is able to verbalize types and ability to use techniques and skills needed for reducing stress and depression.   Education: Stress, Anxiety, and Depression - Group verbal and visual presentation to define topics covered.  Reviews how body is impacted by stress, anxiety, and depression.  Also discusses healthy ways to reduce stress and to treat/manage anxiety and depression.  Written material given at graduation.   Education: Sleep Hygiene -Provides group verbal and written instruction about how sleep can affect your health.  Define sleep hygiene, discuss sleep cycles and impact of sleep habits. Review good sleep hygiene tips.    Initial Review & Psychosocial Screening:  Initial Psych Review & Screening - 09/21/21 1406       Initial Review   Current issues with Current Anxiety/Panic  Family Dynamics   Good Support System? Yes   wife     Barriers   Psychosocial barriers to participate in program There are no identifiable barriers or psychosocial needs.;The patient should benefit from training in stress management and relaxation.      Screening Interventions   Interventions Encouraged to exercise;Provide feedback about the scores to participant;To provide support and resources with identified psychosocial needs    Expected Outcomes Short Term goal: Utilizing psychosocial counselor, staff and physician to assist with identification of specific Stressors or current issues interfering with healing process. Setting desired goal for  each stressor or current issue identified.;Long Term Goal: Stressors or current issues are controlled or eliminated.;Short Term goal: Identification and review with participant of any Quality of Life or Depression concerns found by scoring the questionnaire.;Long Term goal: The participant improves quality of Life and PHQ9 Scores as seen by post scores and/or verbalization of changes             Quality of Life Scores:   Quality of Life - 10/01/21 1639       Quality of Life   Select Quality of Life      Quality of Life Scores   Health/Function Pre 18.17 %    Socioeconomic Pre 18.13 %    Psych/Spiritual Pre 20.36 %    Family Pre 21 %    GLOBAL Pre 19 %            Scores of 19 and below usually indicate a poorer quality of life in these areas.  A difference of  2-3 points is a clinically meaningful difference.  A difference of 2-3 points in the total score of the Quality of Life Index has been associated with significant improvement in overall quality of life, self-image, physical symptoms, and general health in studies assessing change in quality of life.  PHQ-9: Recent Review Flowsheet Data     Depression screen [facility] 2/9 10/01/2021   Decreased Interest 0   Down, Depressed, Hopeless 1   PHQ - 2 Score 1   Altered sleeping 2   Tired, decreased energy 1   Change in appetite 2   Feeling bad or failure about yourself  2   Trouble concentrating 0   Moving slowly or fidgety/restless 0   Suicidal thoughts 0   PHQ-9 Score 8   Difficult doing work/chores Not difficult at all      Interpretation of Total Score  Total Score Depression Severity:  1-4 = Minimal depression, 5-9 = Mild depression, 10-14 = Moderate depression, 15-19 = Moderately severe depression, 20-27 = Severe depression   Psychosocial Evaluation and Intervention:  Psychosocial Evaluation - 09/21/21 1417       Psychosocial Evaluation & Interventions   Interventions Encouraged to exercise with the program and  follow exercise prescription    Comments Andras reports feeling well after his STEMI. He is easing back into work and not overdoing it. He has had a kidney transplant in the past which causes no issues. He does report some anxiety related to his cpap and claustrophobia, so he takes medicine at night prior to placing mask. He is trying to get fitted for a new cpap so he is scheduling a sleep study. His wife is his main support system and they both are looking forward to the education aspect of the program    Expected Outcomes Short: attend cardiac rehab for education and exercise. Long: develop and maintain positive self care habits.    Continue Psychosocial  Services  Follow up required by staff             Psychosocial Re-Evaluation:   Psychosocial Discharge (Final Psychosocial Re-Evaluation):   Vocational Rehabilitation: Provide vocational rehab assistance to qualifying candidates.   Vocational Rehab Evaluation & Intervention:  Vocational Rehab - 09/21/21 1406       Initial Vocational Rehab Evaluation & Intervention   Assessment shows need for Vocational Rehabilitation No             Education: Education Goals: Education classes will be provided on a variety of topics geared toward better understanding of heart health and risk factor modification. Participant will state understanding/return demonstration of topics presented as noted by education test scores.  Learning Barriers/Preferences:  Learning Barriers/Preferences - 09/21/21 1406       Learning Barriers/Preferences   Learning Barriers None    Learning Preferences None             General Cardiac Education Topics:  AED/CPR: - Group verbal and written instruction with the use of models to demonstrate the basic use of the AED with the basic ABC's of resuscitation.   Anatomy and Cardiac Procedures: - Group verbal and visual presentation and models provide information about basic cardiac anatomy and  function. Reviews the testing methods done to diagnose heart disease and the outcomes of the test results. Describes the treatment choices: Medical Management, Angioplasty, or Coronary Bypass Surgery for treating various heart conditions including Myocardial Infarction, Angina, Valve Disease, and Cardiac Arrhythmias.  Written material given at graduation. Flowsheet Row Cardiac Rehab from 10/01/2021 in [facility] Cardiac and Pulmonary Rehab  Education need identified 10/01/21       Medication Safety: - Group verbal and visual instruction to review commonly prescribed medications for heart and lung disease. Reviews the medication, class of the drug, and side effects. Includes the steps to properly store meds and maintain the prescription regimen.  Written material given at graduation.   Intimacy: - Group verbal instruction through game format to discuss how heart and lung disease can affect sexual intimacy. Written material given at graduation..   Know Your Numbers and Heart Failure: - Group verbal and visual instruction to discuss disease risk factors for cardiac and pulmonary disease and treatment options.  Reviews associated critical values for Overweight/Obesity, Hypertension, Cholesterol, and Diabetes.  Discusses basics of heart failure: signs/symptoms and treatments.  Introduces Heart Failure Zone chart for action plan for heart failure.  Written material given at graduation.   Infection Prevention: - Provides verbal and written material to individual with discussion of infection control including proper hand washing and proper equipment cleaning during exercise session. Flowsheet Row Cardiac Rehab from 10/01/2021 in [facility] Cardiac and Pulmonary Rehab  Date 10/01/21  Educator [facility]  Instruction Review Code 1- Verbalizes Understanding       Falls Prevention: - Provides verbal and written material to individual with discussion of falls prevention and safety. Flowsheet Row Cardiac Rehab from  10/01/2021 in [facility] Cardiac and Pulmonary Rehab  Date 10/01/21  Educator [facility]  Instruction Review Code 1- Verbalizes Understanding       Other: -Provides group and verbal instruction on various topics (see comments)   Knowledge Questionnaire Score:  Knowledge Questionnaire Score - 10/01/21 1639       Knowledge Questionnaire Score   Pre Score 24/26             Core Components/Risk Factors/Patient Goals at Admission:  Personal Goals and Risk Factors at Admission - 10/01/21 1636  Core Components/Risk Factors/Patient Goals on Admission    Weight Management Yes    Intervention Weight Management: Develop a combined nutrition and exercise program designed to reach desired caloric intake, while maintaining appropriate intake of nutrient and fiber, sodium and fats, and appropriate energy expenditure required for the weight goal.;Weight Management: Provide education and appropriate resources to help participant work on and attain dietary goals.;Obesity: Provide education and appropriate resources to help participant work on and attain dietary goals.    Admit Weight 237 lb 8 oz (107.7 kg)    Goal Weight: Short Term 232 lb (105.2 kg)    Goal Weight: Long Term 220 lb (99.8 kg)    Expected Outcomes Weight Loss: Understanding of general recommendations for a balanced deficit meal plan, which promotes 1-2 lb weight loss per week and includes a negative energy balance of [phone] kcal/d;Long Term: Adherence to nutrition and physical activity/exercise program aimed toward attainment of established weight goal;Short Term: Continue to assess and modify interventions until short term weight is achieved;Understanding recommendations for meals to include 15-35% energy as protein, 25-35% energy from fat, 35-60% energy from carbohydrates, less than 200mg  of dietary cholesterol, 20-35 gm of total fiber daily;Understanding of distribution of calorie intake throughout the day with the consumption of 4-5  meals/snacks    Tobacco Cessation Yes   recently quit within that last 6 months   Number of packs per day 0    Expected Outcomes Long Term: Complete abstinence from all tobacco products for at least 12 months from quit date.    Hypertension Yes    Intervention Provide education on lifestyle modifcations including regular physical activity/exercise, weight management, moderate sodium restriction and increased consumption of fresh fruit, vegetables, and low fat dairy, alcohol moderation, and smoking cessation.;Monitor prescription use compliance.    Expected Outcomes Short Term: Continued assessment and intervention until BP is < 140/50mm HG in hypertensive participants. < 130/65mm HG in hypertensive participants with diabetes, heart failure or chronic kidney disease.;Long Term: Maintenance of blood pressure at goal levels.    Lipids Yes    Intervention Provide education and support for participant on nutrition & aerobic/resistive exercise along with prescribed medications to achieve LDL 70mg , HDL >40mg .    Expected Outcomes Short Term: Participant states understanding of desired cholesterol values and is compliant with medications prescribed. Participant is following exercise prescription and nutrition guidelines.;Long Term: Cholesterol controlled with medications as prescribed, with individualized exercise RX and with personalized nutrition plan. Value goals: LDL < 70mg , HDL > 40 mg.             Education:Diabetes - Individual verbal and written instruction to review signs/symptoms of diabetes, desired ranges of glucose level fasting, after meals and with exercise. Acknowledge that pre and post exercise glucose checks will be done for 3 sessions at entry of program.   Core Components/Risk Factors/Patient Goals Review:    Core Components/Risk Factors/Patient Goals at Discharge (Final Review):    ITP Comments:  ITP Comments     Row Name 09/21/21 1416 10/01/21 1624 10/08/21 1619 10/24/21  0713 10/29/21 1549   ITP Comments Initial telephone orientation completed. Diagnosis can be found in [facility] 9/5. EP orientation scheduled for Monday 10/10 at 3pm. Completed 6MWT and gym orientation. Initial ITP created and sent for review to Dr. Emily Filbert, Medical Director. First full day of exercise!  Patient was oriented to gym and equipment including functions, settings, policies, and procedures.  Patient's individual exercise prescription and treatment plan were reviewed.  All starting workloads  were established based on the results of the 6 minute walk test done at initial orientation visit.  The plan for exercise progression was also introduced and progression will be customized based on patient's performance and goals. 30 Day review completed. Medical Director ITP review done, changes made as directed, and signed approval by Medical Director.   New to program Attempted to call patient to follow up on [facility] and getting clearance to come back. Left message asking for call back.    [place] Name 10/30/21 1503 11/14/21 1504 11/21/21 0658       ITP Comments Upon checking pt's chart, he was in office today for URI.  We will continue to follow up with him. Pt has not attended since last review.  Staff sent discharge letter 30 Day review completed. Medical Director ITP review done, changes made as directed, and signed approval by Medical Director.              Comments:

## 2021-11-22 DIAGNOSIS — Z955 Presence of coronary angioplasty implant and graft: Secondary | ICD-10-CM

## 2021-11-22 DIAGNOSIS — I213 ST elevation (STEMI) myocardial infarction of unspecified site: Secondary | ICD-10-CM

## 2021-11-22 NOTE — Progress Notes (Signed)
Cardiac Individual Treatment Plan  Patient Details  Name: Chad Mccann MRN: [id number] Date of Birth: 02/14/1966 Referring Provider:   Flowsheet Row Cardiac Rehab from 10/01/2021 in [facility] Cardiac and Pulmonary Rehab  Referring Provider Arida       Initial Encounter Date:  Flowsheet Row Cardiac Rehab from 10/01/2021 in [facility] Cardiac and Pulmonary Rehab  Date 10/01/21       Visit Diagnosis: ST elevation myocardial infarction (STEMI), unspecified artery [facility])  Status post coronary artery stent placement  Patient's Home Medications on Admission:  Current Outpatient Medications:    acetaminophen (TYLENOL) 325 MG tablet, Take 1-2 tablets (325-650 mg total) by mouth every 6 (six) hours as needed for mild pain (pain score 1-3 or temp > 100.5)., Disp: , Rfl:    aspirin 81 MG chewable tablet, Chew 1 tablet (81 mg total) by mouth daily., Disp: 30 tablet, Rfl: 11   carvedilol (COREG) 6.25 MG tablet, Take 1 tablet (6.25 mg total) by mouth 2 (two) times daily with a meal., Disp: 60 tablet, Rfl: 3   clonazePAM (KLONOPIN) 0.5 MG tablet, Take 0.5 mg by mouth at bedtime., Disp: , Rfl:    mycophenolate (CELLCEPT) 250 MG capsule, Take 750 mg by mouth 2 (two) times daily., Disp: , Rfl:    nitroGLYCERIN (NITROSTAT) 0.4 MG SL tablet, Place 1 tablet (0.4 mg total) under the tongue every 5 (five) minutes as needed for chest pain., Disp: 30 tablet, Rfl: 12   prasugrel (EFFIENT) 10 MG TABS tablet, Take 1 tablet (10 mg total) by mouth daily., Disp: 30 tablet, Rfl: 10   rosuvastatin (CRESTOR) 40 MG tablet, Take 1 tablet (40 mg total) by mouth daily., Disp: 30 tablet, Rfl: 3   tacrolimus (PROGRAF) 1 MG capsule, Take 2 mg by mouth 2 (two) times daily., Disp: , Rfl:    tadalafil (CIALIS) 20 MG tablet, Take 1 tablet (20 mg total) by mouth daily as needed for erectile dysfunction., Disp: 6 tablet, Rfl: 11   telmisartan (MICARDIS) 20 MG tablet, Take 20 mg by mouth daily., Disp: , Rfl:    testosterone cypionate  (DEPOTESTOSTERONE CYPIONATE) 200 MG/ML injection, Inject 0.75  mL into the muscle every 14 days, Disp: 10 mL, Rfl: 0  Past Medical History: Past Medical History:  Diagnosis Date   Benign prostatic hypertrophy    Bladder spasm    Erectile dysfunction    Frequency    GERD (gastroesophageal reflux disease)    Hypertension    Hypogonadism in male    Kidney 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    Kidney, malrotation    MGUS (monoclonal gammopathy of unknown significance)    Monoclonal paraproteinemia    Obesity, unspecified    Other specified congenital anomaly of kidney    Other specified disorders of bladder    Other testicular hypofunction    Premature ejaculation    Sleep apnea    Ventral hernia     Tobacco Use: Social History   Tobacco Use  Smoking Status Former   Types: Cigars   Quit date: 08/27/2021   Years since quitting: 0.2  Smokeless Tobacco Never    Labs: Recent Review Flowsheet Data     Labs for ITP Cardiac and Pulmonary Rehab Latest Ref Rng & Units 11/28/2015 12/01/2020 08/27/2021 10/09/2021   Cholestrol 100 - 199 mg/dL 130 142 143 89(L)   LDLCALC 0 - 99 mg/dL 74 90 82 41   HDL >39 mg/dL 25(L) 27(L) 28(L) 27(L)   Trlycerides 0 - 149 mg/dL 157(H) 136 166(H) 111  Hemoglobin A1c 4.8 - 5.6 % - - 5.5 -        Exercise Target Goals: Exercise Program Goal: Individual exercise prescription set using results from initial 6 min walk test and THRR while considering  patient's activity barriers and safety.   Exercise Prescription Goal: Initial exercise prescription builds to 30-45 minutes a day of aerobic activity, 2-3 days per week.  Home exercise guidelines will be given to patient during program as part of exercise prescription that the participant will acknowledge.   Education: Aerobic Exercise: - Group verbal and visual presentation on the components of exercise prescription. Introduces F.I.T.T principle from ACSM for exercise prescriptions.  Reviews F.I.T.T. principles of  aerobic exercise including progression. Written material given at graduation.   Education: Resistance Exercise: - Group verbal and visual presentation on the components of exercise prescription. Introduces F.I.T.T principle from ACSM for exercise prescriptions  Reviews F.I.T.T. principles of resistance exercise including progression. Written material given at graduation.    Education: Exercise & Equipment Safety: - Individual verbal instruction and demonstration of equipment use and safety with use of the equipment. Flowsheet Row Cardiac Rehab from 10/01/2021 in [facility] Cardiac and Pulmonary Rehab  Date 10/01/21  Educator [facility]  Instruction Review Code 1- Verbalizes Understanding       Education: Exercise Physiology & General Exercise Guidelines: - Group verbal and written instruction with models to review the exercise physiology of the cardiovascular system and associated critical values. Provides general exercise guidelines with specific guidelines to those with heart or lung disease.    Education: Flexibility, Balance, Mind/Body Relaxation: - Group verbal and visual presentation with interactive activity on the components of exercise prescription. Introduces F.I.T.T principle from ACSM for exercise prescriptions. Reviews F.I.T.T. principles of flexibility and balance exercise training including progression. Also discusses the mind body connection.  Reviews various relaxation techniques to help reduce and manage stress (i.e. Deep breathing, progressive muscle relaxation, and visualization). Balance handout provided to take home. Written material given at graduation.   Activity Barriers & Risk Stratification:  Activity Barriers & Cardiac Risk Stratification - 09/21/21 1404       Activity Barriers & Cardiac Risk Stratification   Activity Barriers Left Hip Replacement;Back Problems;Neck/Spine Problems    Cardiac Risk Stratification Moderate             6 Minute Walk:  6 Minute Walk      Row Name 10/01/21 1625         6 Minute Walk   Phase Initial     Distance 1555 feet     Walk Time 6 minutes     # of Rest Breaks 0     MPH 2.95     METS 3.84     RPE 9     VO2 Peak 13.44     Symptoms No     Resting HR 61 bpm     Resting BP 138/82     Resting Oxygen Saturation  96 %     Exercise Oxygen Saturation  during 6 min walk 98 %     Max Ex. HR 92 bpm     Max Ex. BP 150/80     2 Minute Post BP 126/72              Oxygen Initial Assessment:   Oxygen Re-Evaluation:   Oxygen Discharge (Final Oxygen Re-Evaluation):   Initial Exercise Prescription:  Initial Exercise Prescription - 10/01/21 1600       Date of Initial Exercise RX and  Referring Provider   Date 10/01/21    Referring Provider Arida      Oxygen   Maintain Oxygen Saturation 88% or higher      Treadmill   MPH 2.7    Grade 0.5    Minutes 15    METs 3.25      Recumbant Bike   Level 2    RPM 60    Watts 30    Minutes 15    METs 3.8      Elliptical   Level 1    Speed 3    Minutes 15      REL-XR   Level 3    Speed 50    Minutes 15    METs 3.8      Prescription Details   Frequency (times per week) 2    Duration Progress to 30 minutes of continuous aerobic without signs/symptoms of physical distress      Intensity   THRR 40-80% of Max Heartrate 102-143    Ratings of Perceived Exertion 11-13    Perceived Dyspnea 0-4      Progression   Progression Continue to progress workloads to maintain intensity without signs/symptoms of physical distress.      Resistance Training   Training Prescription Yes    Weight 4    Reps 10-15             Perform Capillary Blood Glucose checks as needed.  Exercise Prescription Changes:   Exercise Prescription Changes     Row Name 10/01/21 1600 10/15/21 1500 10/30/21 1500         Response to Exercise   Blood Pressure (Admit) 138/82 102/64 124/62     Blood Pressure (Exercise) 150/80 122/60 134/64     Blood Pressure (Exit) 126/72  122/62 122/68     Heart Rate (Admit) 61 bpm 85 bpm 68 bpm     Heart Rate (Exercise) 92 bpm 94 bpm 96 bpm     Heart Rate (Exit) 69 bpm 68 bpm 60 bpm     Oxygen Saturation (Admit) 96 % -- --     Oxygen Saturation (Exercise) 98 % -- --     Rating of Perceived Exertion (Exercise) 9 11 11      Symptoms none none none     Comments walk test results first day second full day of exercise     Duration -- Progress to 30 minutes of  aerobic without signs/symptoms of physical distress Progress to 30 minutes of  aerobic without signs/symptoms of physical distress     Intensity -- THRR unchanged THRR unchanged       Progression   Progression -- Continue to progress workloads to maintain intensity without signs/symptoms of physical distress. Continue to progress workloads to maintain intensity without signs/symptoms of physical distress.     Average METs -- 2.5 3.18       Resistance Training   Training Prescription -- Yes Yes     Weight -- 4 lb 3 lb     Reps -- 10-15 10-15       Interval Training   Interval Training -- -- No       Treadmill   MPH -- -- 2.7     Grade -- -- 0.5     Minutes -- -- 15     METs -- -- 3.25       Recumbant Bike   Level -- 2 --     Watts -- 29 --     Minutes --  15 --     METs -- 2.8 --       NuStep   Level -- -- 4     Minutes -- -- 15     METs -- -- 3.1       T5 Nustep   Level -- 3 --     Minutes -- 15 --     METs -- 2.1 --       Oxygen   Maintain Oxygen Saturation -- -- 88% or higher              Exercise Comments:   Exercise Comments     Row Name 10/08/21 1619           Exercise Comments First full day of exercise!  Patient was oriented to gym and equipment including functions, settings, policies, and procedures.  Patient's individual exercise prescription and treatment plan were reviewed.  All starting workloads were established based on the results of the 6 minute walk test done at initial orientation visit.  The plan for exercise  progression was also introduced and progression will be customized based on patient's performance and goals.                Exercise Goals and Review:   Exercise Goals     Row Name 10/01/21 1633             Exercise Goals   Increase Physical Activity Yes       Intervention Provide advice, education, support and counseling about physical activity/exercise needs.;Develop an individualized exercise prescription for aerobic and resistive training based on initial evaluation findings, risk stratification, comorbidities and participant's personal goals.       Expected Outcomes Short Term: Attend rehab on a regular basis to increase amount of physical activity.;Long Term: Add in home exercise to make exercise part of routine and to increase amount of physical activity.;Long Term: Exercising regularly at least 3-5 days a week.       Increase Strength and Stamina Yes       Intervention Provide advice, education, support and counseling about physical activity/exercise needs.;Develop an individualized exercise prescription for aerobic and resistive training based on initial evaluation findings, risk stratification, comorbidities and participant's personal goals.       Expected Outcomes Short Term: Increase workloads from initial exercise prescription for resistance, speed, and METs.;Short Term: Perform resistance training exercises routinely during rehab and add in resistance training at home;Long Term: Improve cardiorespiratory fitness, muscular endurance and strength as measured by increased METs and functional capacity (6MWT)       Able to understand and use rate of perceived exertion (RPE) scale Yes       Intervention Provide education and explanation on how to use RPE scale       Expected Outcomes Short Term: Able to use RPE daily in rehab to express subjective intensity level;Long Term:  Able to use RPE to guide intensity level when exercising independently       Able to understand and use  Dyspnea scale Yes       Intervention Provide education and explanation on how to use Dyspnea scale       Expected Outcomes Short Term: Able to use Dyspnea scale daily in rehab to express subjective sense of shortness of breath during exertion;Long Term: Able to use Dyspnea scale to guide intensity level when exercising independently       Knowledge and understanding of Target Heart Rate Range (THRR) Yes  Intervention Provide education and explanation of THRR including how the numbers were predicted and where they are located for reference       Expected Outcomes Short Term: Able to state/look up THRR;Long Term: Able to use THRR to govern intensity when exercising independently;Short Term: Able to use daily as guideline for intensity in rehab       Able to check pulse independently Yes       Intervention Provide education and demonstration on how to check pulse in carotid and radial arteries.;Review the importance of being able to check your own pulse for safety during independent exercise       Expected Outcomes Short Term: Able to explain why pulse checking is important during independent exercise;Long Term: Able to check pulse independently and accurately       Understanding of Exercise Prescription Yes       Intervention Provide education, explanation, and written materials on patient's individual exercise prescription       Expected Outcomes Short Term: Able to explain program exercise prescription;Long Term: Able to explain home exercise prescription to exercise independently                Exercise Goals Re-Evaluation :  Exercise Goals Re-Evaluation     [place] Name 10/08/21 1619 10/30/21 1505           Exercise Goal Re-Evaluation   Exercise Goals Review Increase Physical Activity;Able to understand and use rate of perceived exertion (RPE) scale;Knowledge and understanding of Target Heart Rate Range (THRR);Understanding of Exercise Prescription;Increase Strength and Stamina;Able to  understand and use Dyspnea scale;Able to check pulse independently Increase Physical Activity;Increase Strength and Stamina;Understanding of Exercise Prescription      Comments Reviewed RPE and dyspnea scales, THR and program prescription with pt today.  Pt voiced understanding and was given a copy of goals to take home. Mizraim has only attended his first two visits.  He has been out sick recently.  We will continue to monitor his progress.      Expected Outcomes Short: Use RPE daily to regulate intensity. Long: Follow program prescription in THR. Short: Return to regular attendance Long: Continue to follow program prescription.               Discharge Exercise Prescription (Final Exercise Prescription Changes):  Exercise Prescription Changes - 10/30/21 1500       Response to Exercise   Blood Pressure (Admit) 124/62    Blood Pressure (Exercise) 134/64    Blood Pressure (Exit) 122/68    Heart Rate (Admit) 68 bpm    Heart Rate (Exercise) 96 bpm    Heart Rate (Exit) 60 bpm    Rating of Perceived Exertion (Exercise) 11    Symptoms none    Comments second full day of exercise    Duration Progress to 30 minutes of  aerobic without signs/symptoms of physical distress    Intensity THRR unchanged      Progression   Progression Continue to progress workloads to maintain intensity without signs/symptoms of physical distress.    Average METs 3.18      Resistance Training   Training Prescription Yes    Weight 3 lb    Reps 10-15      Interval Training   Interval Training No      Treadmill   MPH 2.7    Grade 0.5    Minutes 15    METs 3.25      NuStep   Level 4    Minutes  15    METs 3.1      Oxygen   Maintain Oxygen Saturation 88% or higher             Nutrition:  Target Goals: Understanding of nutrition guidelines, daily intake of sodium 1500mg , cholesterol 200mg , calories 30% from fat and 7% or less from saturated fats, daily to have 5 or more servings of fruits and  vegetables.  Education: All About Nutrition: -Group instruction provided by verbal, written material, interactive activities, discussions, models, and posters to present general guidelines for heart healthy nutrition including fat, fiber, MyPlate, the role of sodium in heart healthy nutrition, utilization of the nutrition label, and utilization of this knowledge for meal planning. Follow up email sent as well. Written material given at graduation. Flowsheet Row Cardiac Rehab from 10/01/2021 in [facility] Cardiac and Pulmonary Rehab  Education need identified 10/01/21       Biometrics:  Pre Biometrics - 10/01/21 1634       Pre Biometrics   Height 5' 10.75" (1.797 m)    Weight 237 lb 8 oz (107.7 kg)    BMI (Calculated) 33.36    Single Leg Stand 30 seconds              Nutrition Therapy Plan and Nutrition Goals:  Nutrition Therapy & Goals - 10/01/21 1641       Intervention Plan   Intervention Prescribe, educate and counsel regarding individualized specific dietary modifications aiming towards targeted core components such as weight, hypertension, lipid management, diabetes, heart failure and other comorbidities.    Expected Outcomes Short Term Goal: Understand basic principles of dietary content, such as calories, fat, sodium, cholesterol and nutrients.;Short Term Goal: A plan has been developed with personal nutrition goals set during dietitian appointment.;Long Term Goal: Adherence to prescribed nutrition plan.             Nutrition Assessments:  MEDIFICTS Score Key: ?70 Need to make dietary changes  40-70 Heart Healthy Diet ? 40 Therapeutic Level Cholesterol Diet  Flowsheet Row Cardiac Rehab from 10/01/2021 in [facility] Cardiac and Pulmonary Rehab  Picture Your Plate Total Score on Admission 62      Picture Your Plate Scores: <40 Unhealthy dietary pattern with much room for improvement. 41-50 Dietary pattern unlikely to meet recommendations for good health and room for  improvement. 51-60 More healthful dietary pattern, with some room for improvement.  >60 Healthy dietary pattern, although there may be some specific behaviors that could be improved.    Nutrition Goals Re-Evaluation:   Nutrition Goals Discharge (Final Nutrition Goals Re-Evaluation):   Psychosocial: Target Goals: Acknowledge presence or absence of significant depression and/or stress, maximize coping skills, provide positive support system. Participant is able to verbalize types and ability to use techniques and skills needed for reducing stress and depression.   Education: Stress, Anxiety, and Depression - Group verbal and visual presentation to define topics covered.  Reviews how body is impacted by stress, anxiety, and depression.  Also discusses healthy ways to reduce stress and to treat/manage anxiety and depression.  Written material given at graduation.   Education: Sleep Hygiene -Provides group verbal and written instruction about how sleep can affect your health.  Define sleep hygiene, discuss sleep cycles and impact of sleep habits. Review good sleep hygiene tips.    Initial Review & Psychosocial Screening:  Initial Psych Review & Screening - 09/21/21 1406       Initial Review   Current issues with Current Anxiety/Panic  Family Dynamics   Good Support System? Yes   wife     Barriers   Psychosocial barriers to participate in program There are no identifiable barriers or psychosocial needs.;The patient should benefit from training in stress management and relaxation.      Screening Interventions   Interventions Encouraged to exercise;Provide feedback about the scores to participant;To provide support and resources with identified psychosocial needs    Expected Outcomes Short Term goal: Utilizing psychosocial counselor, staff and physician to assist with identification of specific Stressors or current issues interfering with healing process. Setting desired goal for  each stressor or current issue identified.;Long Term Goal: Stressors or current issues are controlled or eliminated.;Short Term goal: Identification and review with participant of any Quality of Life or Depression concerns found by scoring the questionnaire.;Long Term goal: The participant improves quality of Life and PHQ9 Scores as seen by post scores and/or verbalization of changes             Quality of Life Scores:   Quality of Life - 10/01/21 1639       Quality of Life   Select Quality of Life      Quality of Life Scores   Health/Function Pre 18.17 %    Socioeconomic Pre 18.13 %    Psych/Spiritual Pre 20.36 %    Family Pre 21 %    GLOBAL Pre 19 %            Scores of 19 and below usually indicate a poorer quality of life in these areas.  A difference of  2-3 points is a clinically meaningful difference.  A difference of 2-3 points in the total score of the Quality of Life Index has been associated with significant improvement in overall quality of life, self-image, physical symptoms, and general health in studies assessing change in quality of life.  PHQ-9: Recent Review Flowsheet Data     Depression screen [facility] 2/9 10/01/2021   Decreased Interest 0   Down, Depressed, Hopeless 1   PHQ - 2 Score 1   Altered sleeping 2   Tired, decreased energy 1   Change in appetite 2   Feeling bad or failure about yourself  2   Trouble concentrating 0   Moving slowly or fidgety/restless 0   Suicidal thoughts 0   PHQ-9 Score 8   Difficult doing work/chores Not difficult at all      Interpretation of Total Score  Total Score Depression Severity:  1-4 = Minimal depression, 5-9 = Mild depression, 10-14 = Moderate depression, 15-19 = Moderately severe depression, 20-27 = Severe depression   Psychosocial Evaluation and Intervention:  Psychosocial Evaluation - 09/21/21 1417       Psychosocial Evaluation & Interventions   Interventions Encouraged to exercise with the program and  follow exercise prescription    Comments Tupac reports feeling well after his STEMI. He is easing back into work and not overdoing it. He has had a kidney transplant in the past which causes no issues. He does report some anxiety related to his cpap and claustrophobia, so he takes medicine at night prior to placing mask. He is trying to get fitted for a new cpap so he is scheduling a sleep study. His wife is his main support system and they both are looking forward to the education aspect of the program    Expected Outcomes Short: attend cardiac rehab for education and exercise. Long: develop and maintain positive self care habits.    Continue Psychosocial  Services  Follow up required by staff             Psychosocial Re-Evaluation:   Psychosocial Discharge (Final Psychosocial Re-Evaluation):   Vocational Rehabilitation: Provide vocational rehab assistance to qualifying candidates.   Vocational Rehab Evaluation & Intervention:  Vocational Rehab - 09/21/21 1406       Initial Vocational Rehab Evaluation & Intervention   Assessment shows need for Vocational Rehabilitation No             Education: Education Goals: Education classes will be provided on a variety of topics geared toward better understanding of heart health and risk factor modification. Participant will state understanding/return demonstration of topics presented as noted by education test scores.  Learning Barriers/Preferences:  Learning Barriers/Preferences - 09/21/21 1406       Learning Barriers/Preferences   Learning Barriers None    Learning Preferences None             General Cardiac Education Topics:  AED/CPR: - Group verbal and written instruction with the use of models to demonstrate the basic use of the AED with the basic ABC's of resuscitation.   Anatomy and Cardiac Procedures: - Group verbal and visual presentation and models provide information about basic cardiac anatomy and  function. Reviews the testing methods done to diagnose heart disease and the outcomes of the test results. Describes the treatment choices: Medical Management, Angioplasty, or Coronary Bypass Surgery for treating various heart conditions including Myocardial Infarction, Angina, Valve Disease, and Cardiac Arrhythmias.  Written material given at graduation. Flowsheet Row Cardiac Rehab from 10/01/2021 in [facility] Cardiac and Pulmonary Rehab  Education need identified 10/01/21       Medication Safety: - Group verbal and visual instruction to review commonly prescribed medications for heart and lung disease. Reviews the medication, class of the drug, and side effects. Includes the steps to properly store meds and maintain the prescription regimen.  Written material given at graduation.   Intimacy: - Group verbal instruction through game format to discuss how heart and lung disease can affect sexual intimacy. Written material given at graduation..   Know Your Numbers and Heart Failure: - Group verbal and visual instruction to discuss disease risk factors for cardiac and pulmonary disease and treatment options.  Reviews associated critical values for Overweight/Obesity, Hypertension, Cholesterol, and Diabetes.  Discusses basics of heart failure: signs/symptoms and treatments.  Introduces Heart Failure Zone chart for action plan for heart failure.  Written material given at graduation.   Infection Prevention: - Provides verbal and written material to individual with discussion of infection control including proper hand washing and proper equipment cleaning during exercise session. Flowsheet Row Cardiac Rehab from 10/01/2021 in [facility] Cardiac and Pulmonary Rehab  Date 10/01/21  Educator [facility]  Instruction Review Code 1- Verbalizes Understanding       Falls Prevention: - Provides verbal and written material to individual with discussion of falls prevention and safety. Flowsheet Row Cardiac Rehab from  10/01/2021 in [facility] Cardiac and Pulmonary Rehab  Date 10/01/21  Educator [facility]  Instruction Review Code 1- Verbalizes Understanding       Other: -Provides group and verbal instruction on various topics (see comments)   Knowledge Questionnaire Score:  Knowledge Questionnaire Score - 10/01/21 1639       Knowledge Questionnaire Score   Pre Score 24/26             Core Components/Risk Factors/Patient Goals at Admission:  Personal Goals and Risk Factors at Admission - 10/01/21 1636  Core Components/Risk Factors/Patient Goals on Admission    Weight Management Yes    Intervention Weight Management: Develop a combined nutrition and exercise program designed to reach desired caloric intake, while maintaining appropriate intake of nutrient and fiber, sodium and fats, and appropriate energy expenditure required for the weight goal.;Weight Management: Provide education and appropriate resources to help participant work on and attain dietary goals.;Obesity: Provide education and appropriate resources to help participant work on and attain dietary goals.    Admit Weight 237 lb 8 oz (107.7 kg)    Goal Weight: Short Term 232 lb (105.2 kg)    Goal Weight: Long Term 220 lb (99.8 kg)    Expected Outcomes Weight Loss: Understanding of general recommendations for a balanced deficit meal plan, which promotes 1-2 lb weight loss per week and includes a negative energy balance of [phone] kcal/d;Long Term: Adherence to nutrition and physical activity/exercise program aimed toward attainment of established weight goal;Short Term: Continue to assess and modify interventions until short term weight is achieved;Understanding recommendations for meals to include 15-35% energy as protein, 25-35% energy from fat, 35-60% energy from carbohydrates, less than 200mg  of dietary cholesterol, 20-35 gm of total fiber daily;Understanding of distribution of calorie intake throughout the day with the consumption of 4-5  meals/snacks    Tobacco Cessation Yes   recently quit within that last 6 months   Number of packs per day 0    Expected Outcomes Long Term: Complete abstinence from all tobacco products for at least 12 months from quit date.    Hypertension Yes    Intervention Provide education on lifestyle modifcations including regular physical activity/exercise, weight management, moderate sodium restriction and increased consumption of fresh fruit, vegetables, and low fat dairy, alcohol moderation, and smoking cessation.;Monitor prescription use compliance.    Expected Outcomes Short Term: Continued assessment and intervention until BP is < 140/80mm HG in hypertensive participants. < 130/3mm HG in hypertensive participants with diabetes, heart failure or chronic kidney disease.;Long Term: Maintenance of blood pressure at goal levels.    Lipids Yes    Intervention Provide education and support for participant on nutrition & aerobic/resistive exercise along with prescribed medications to achieve LDL 70mg , HDL >40mg .    Expected Outcomes Short Term: Participant states understanding of desired cholesterol values and is compliant with medications prescribed. Participant is following exercise prescription and nutrition guidelines.;Long Term: Cholesterol controlled with medications as prescribed, with individualized exercise RX and with personalized nutrition plan. Value goals: LDL < 70mg , HDL > 40 mg.             Education:Diabetes - Individual verbal and written instruction to review signs/symptoms of diabetes, desired ranges of glucose level fasting, after meals and with exercise. Acknowledge that pre and post exercise glucose checks will be done for 3 sessions at entry of program.   Core Components/Risk Factors/Patient Goals Review:    Core Components/Risk Factors/Patient Goals at Discharge (Final Review):    ITP Comments:  ITP Comments     Row Name 09/21/21 1416 10/01/21 1624 10/08/21 1619 10/24/21  0713 10/29/21 1549   ITP Comments Initial telephone orientation completed. Diagnosis can be found in [facility] 9/5. EP orientation scheduled for Monday 10/10 at 3pm. Completed 6MWT and gym orientation. Initial ITP created and sent for review to Dr. Emily Filbert, Medical Director. First full day of exercise!  Patient was oriented to gym and equipment including functions, settings, policies, and procedures.  Patient's individual exercise prescription and treatment plan were reviewed.  All starting workloads  were established based on the results of the 6 minute walk test done at initial orientation visit.  The plan for exercise progression was also introduced and progression will be customized based on patient's performance and goals. 30 Day review completed. Medical Director ITP review done, changes made as directed, and signed approval by Medical Director.   New to program Attempted to call patient to follow up on [facility] and getting clearance to come back. Left message asking for call back.    [place] Name 10/30/21 1503 11/14/21 1504 11/21/21 0658       ITP Comments Upon checking pt's chart, he was in office today for URI.  We will continue to follow up with him. Pt has not attended since last review.  Staff sent discharge letter 30 Day review completed. Medical Director ITP review done, changes made as directed, and signed approval by Medical Director.              Comments: Discharge ITP

## 2021-11-22 NOTE — Progress Notes (Signed)
Discharge Progress Report  Patient Details  Name: Chad Mccann MRN: [id number] Date of Birth: December 19, 1966 Referring Provider:   Flowsheet Row Cardiac Rehab from 10/01/2021 in [facility] Cardiac and Pulmonary Rehab  Referring Provider [place]        Number of Visits: 3  Reason for Discharge:  Early Exit:  Lack of attendance  Smoking History:  Social History   Tobacco Use  Smoking Status Former   Types: Cigars   Quit date: 08/27/2021   Years since quitting: 0.2  Smokeless Tobacco Never    Diagnosis:  ST elevation myocardial infarction (STEMI), unspecified artery ([place])  Status post coronary artery stent placement  ADL UCSD:   Initial Exercise Prescription:  Initial Exercise Prescription - 10/01/21 1600       Date of Initial Exercise RX and Referring Provider   Date 10/01/21    Referring Provider Arida      Oxygen   Maintain Oxygen Saturation 88% or higher      Treadmill   MPH 2.7    Grade 0.5    Minutes 15    METs 3.25      Recumbant Bike   Level 2    RPM 60    Watts 30    Minutes 15    METs 3.8      Elliptical   Level 1    Speed 3    Minutes 15      REL-XR   Level 3    Speed 50    Minutes 15    METs 3.8      Prescription Details   Frequency (times per week) 2    Duration Progress to 30 minutes of continuous aerobic without signs/symptoms of physical distress      Intensity   THRR 40-80% of Max Heartrate 102-143    Ratings of Perceived Exertion 11-13    Perceived Dyspnea 0-4      Progression   Progression Continue to progress workloads to maintain intensity without signs/symptoms of physical distress.      Resistance Training   Training Prescription Yes    Weight 4    Reps 10-15             Discharge Exercise Prescription (Final Exercise Prescription Changes):  Exercise Prescription Changes - 10/30/21 1500       Response to Exercise   Blood Pressure (Admit) 124/62    Blood Pressure (Exercise) 134/64    Blood Pressure (Exit)  122/68    Heart Rate (Admit) 68 bpm    Heart Rate (Exercise) 96 bpm    Heart Rate (Exit) 60 bpm    Rating of Perceived Exertion (Exercise) 11    Symptoms none    Comments second full day of exercise    Duration Progress to 30 minutes of  aerobic without signs/symptoms of physical distress    Intensity THRR unchanged      Progression   Progression Continue to progress workloads to maintain intensity without signs/symptoms of physical distress.    Average METs 3.18      Resistance Training   Training Prescription Yes    Weight 3 lb    Reps 10-15      Interval Training   Interval Training No      Treadmill   MPH 2.7    Grade 0.5    Minutes 15    METs 3.25      NuStep   Level 4    Minutes 15    METs 3.1  Oxygen   Maintain Oxygen Saturation 88% or higher             Functional Capacity:  6 Minute Walk     Row Name 10/01/21 1625         6 Minute Walk   Phase Initial     Distance 1555 feet     Walk Time 6 minutes     # of Rest Breaks 0     MPH 2.95     METS 3.84     RPE 9     VO2 Peak 13.44     Symptoms No     Resting HR 61 bpm     Resting BP 138/82     Resting Oxygen Saturation  96 %     Exercise Oxygen Saturation  during 6 min walk 98 %     Max Ex. HR 92 bpm     Max Ex. BP 150/80     2 Minute Post BP 126/72               Nutrition & Weight - Outcomes:  Pre Biometrics - 10/01/21 1634       Pre Biometrics   Height 5' 10.75" (1.797 m)    Weight 237 lb 8 oz (107.7 kg)    BMI (Calculated) 33.36    Single Leg Stand 30 seconds              Nutrition:  Nutrition Therapy & Goals - 10/01/21 1641       Intervention Plan   Intervention Prescribe, educate and counsel regarding individualized specific dietary modifications aiming towards targeted core components such as weight, hypertension, lipid management, diabetes, heart failure and other comorbidities.    Expected Outcomes Short Term Goal: Understand basic principles of dietary  content, such as calories, fat, sodium, cholesterol and nutrients.;Short Term Goal: A plan has been developed with personal nutrition goals set during dietitian appointment.;Long Term Goal: Adherence to prescribed nutrition plan.              Goals reviewed with patient; copy given to patient.

## 2021-12-18 ENCOUNTER — Encounter: Payer: Self-pay | Admitting: Urology

## 2021-12-18 ENCOUNTER — Other Ambulatory Visit: Payer: BC Managed Care – PPO

## 2021-12-31 NOTE — Telephone Encounter (Signed)
REQUEST FOR CARDIAC CLEARANCE       Date: 12/31/2021  Request Clearance from Dr. Fletcher Anon  Faxed to: Inbasket  Procedure: clearance to take Viagra  Date of Procedure: 12/31/2020  Provider: Zara Council, PA    Cardiac  Clearance :   Reason:      Risk Assessment:    Low   []       Moderate   []     High   []           This patient is optimized for surgery  YES []       NO   []    I recommend further assessment/workup prior to procedure:    YES []      NO  []   Appointment scheduled for: _______________________   Further recommendations: ______________________________    Physician Signature:__________________________________   Printed Name: ________________________________________   Date: _________________

## 2022-01-09 NOTE — Progress Notes (Addendum)
Cardiology Office Note:    Date:  01/11/2022   ID:  Chad Mccann, DOB 10/14/1966, MRN [id number]  PCP:  [name], MD  [facility] Cardiologist:  None  [facility] Electrophysiologist:  None   Referring MD: [name], MD   Chief Complaint: 3 month follow-up  History of Present Illness:    Chad Mccann is a 56 y.o. male with a hx of with a hx of FSGS s/p renal transplant, recent STEMI s/p DES to mLAD, HTN who presents for hospital follow-up.    Patient was admitted 08/2021 for anterior STEMI s/p DES to mLAD. Echo showed LVEF 55-60%, G2DD, trivial MR. Insurance would not cover Brilinta. He was transitioned to Effient. He was started on Aspirin, coreg, Crestor.    Seen 09/10/21 and was dong well from a cardiac perspective. He was referred to cardiac rehab and pulmonology for OSA testing.    Last seen 10/09/21 and was doing well from a cardiac perspective.   Today, the patient has been doing well sine the last visit. No chest pain or shortness of breath. No LLE, orthopnea, pnd, palpitations. Patient goes to the gym 4 times a week. No anginal symptoms with this. Diet is good. Has not used NTG. Labs in October showed some dehydration, he follows closely with nephrology. On ARB. He reports compliance with DAPT with Aspirin and Effient.   Past Medical History:  Diagnosis Date   Benign prostatic hypertrophy    Bladder spasm    Erectile dysfunction    Frequency    GERD (gastroesophageal reflux disease)    Hypertension    Hypogonadism in male    Kidney 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    Kidney, malrotation    MGUS (monoclonal gammopathy of unknown significance)    Monoclonal paraproteinemia    Obesity, unspecified    Other specified congenital anomaly of kidney    Other specified disorders of bladder    Other testicular hypofunction    Premature ejaculation    Sleep apnea    Ventral hernia     Past Surgical History:  Procedure Laterality Date   BACK SURGERY     c5/6  fusion after discectomy 3 weeks prior   COLONOSCOPY     polyp removal   CORONARY/GRAFT ACUTE MI REVASCULARIZATION N/A 08/27/2021   Procedure: Coronary/Graft Acute MI Revascularization;  Surgeon: Wellington Hampshire, MD;  Location: [facility];  Service: Cardiovascular;  Laterality: N/A;   dialysis port placement     and removal   HERNIA REPAIR  12/29/13   ventral   KIDNEY TRANSPLANT  01/2012   LEFT HEART CATH AND CORONARY ANGIOGRAPHY N/A 08/27/2021   Procedure: LEFT HEART CATH AND CORONARY ANGIOGRAPHY;  Surgeon: Wellington Hampshire, MD;  Location: [facility];  Service: Cardiovascular;  Laterality: N/A;   SPINE SURGERY     cervical fusion   TOTAL HIP ARTHROPLASTY Left 03/02/2019   Procedure: TOTAL HIP ARTHROPLASTY ANTERIOR APPROACH-LEFT;  Surgeon: Hessie Knows, MD;  Location: [facility];  Service: Orthopedics;  Laterality: Left;    Current Medications: Current Meds  Medication Sig   acetaminophen (TYLENOL) 325 MG tablet Take 1-2 tablets (325-650 mg total) by mouth every 6 (six) hours as needed for mild pain (pain score 1-3 or temp > 100.5).   aspirin 81 MG chewable tablet Chew 1 tablet (81 mg total) by mouth daily.   clonazePAM (KLONOPIN) 0.5 MG tablet Take 0.5 mg by mouth at bedtime.   mycophenolate (CELLCEPT) 250 MG capsule Take  750 mg by mouth 2 (two) times daily.   nitroGLYCERIN (NITROSTAT) 0.4 MG SL tablet Place 1 tablet (0.4 mg total) under the tongue every 5 (five) minutes as needed for chest pain.   prasugrel (EFFIENT) 10 MG TABS tablet Take 1 tablet (10 mg total) by mouth daily.   rosuvastatin (CRESTOR) 40 MG tablet Take 1 tablet (40 mg total) by mouth daily.   tacrolimus (PROGRAF) 1 MG capsule Take 2 mg by mouth 2 (two) times daily.   tadalafil (CIALIS) 20 MG tablet Take 1 tablet (20 mg total) by mouth daily as needed for erectile dysfunction.   telmisartan (MICARDIS) 20 MG tablet Take 20 mg by mouth daily.   testosterone cypionate (DEPOTESTOSTERONE CYPIONATE) 200 MG/ML  injection Inject 0.75  mL into the muscle every 14 days   [DISCONTINUED] carvedilol (COREG) 6.25 MG tablet Take 1 tablet (6.25 mg total) by mouth 2 (two) times daily with a meal.     Allergies:   Hydralazine, Morphine and related, Prednisone, and Shellfish allergy   Social History   Socioeconomic History   Marital status: Married    Spouse name: Not on file   Number of children: Not on file   Years of education: Not on file   Highest education level: Not on file  Occupational History   Occupation: Hotel manager  Tobacco Use   Smoking status: Former    Types: Cigars    Quit date: 08/27/2021    Years since quitting: 0.3   Smokeless tobacco: Never  Vaping Use   Vaping Use: Never used  Substance and Sexual Activity   Alcohol use: Yes    Alcohol/week: 0.0 standard drinks    Comment: occ   Drug use: No   Sexual activity: Yes  Other Topics Concern   Not on file  Social History Narrative   Not on file   Social Determinants of Health   Financial Resource Strain: Not on file  Food Insecurity: Not on file  Transportation Needs: Not on file  Physical Activity: Not on file  Stress: Not on file  Social Connections: Not on file     Family History: The patient's family history includes Colon cancer in his maternal grandfather; Colon cancer (age of onset: 53) in his father; Heart attack in his father; Stroke in his brother. There is no history of Kidney disease, Prostate cancer, Kidney cancer, or Bladder Cancer.  ROS:   Please see the history of present illness.     All other systems reviewed and are negative.  EKGs/Labs/Other Studies Reviewed:    The following studies were reviewed today:  Echo 08/2021  1. Left ventricular ejection fraction, by estimation, is 55 to 60%. The  left ventricle has normal function. The left ventricle has no regional  wall motion abnormalities. Left ventricular diastolic parameters are  consistent with Grade II diastolic  dysfunction  (pseudonormalization).   2. Right ventricular systolic function is normal. The right ventricular  size is normal.   3. The mitral valve is normal in structure. Trivial mitral valve  regurgitation. No evidence of mitral stenosis.   4. The aortic valve is tricuspid. Aortic valve regurgitation is not  visualized. No aortic stenosis is present.   5. The inferior vena cava is normal in size with greater than 50%  respiratory variability, suggesting right atrial pressure of 3 mmHg.    Cardiac cath 08/2021   Mid LM to Dist LM lesion is 30% stenosed.   Mid LAD lesion is 99% stenosed.  Ost LAD lesion is 30% stenosed.   Prox RCA lesion is 30% stenosed.   Prox RCA to Mid RCA lesion is 40% stenosed.   RPDA lesion is 30% stenosed.   A drug-eluting stent was successfully placed using a STENT ONYX FRONTIER 3.0X22.   Post intervention, there is a 0% residual stenosis.   1.  Severe one-vessel coronary artery disease with 99% thrombotic stenosis in the mid LAD which is the culprit for anterior STEMI.  Mild to moderate left main and RCA disease. 2.  Left ventricular angiography was not performed due to chronic kidney disease.  LVEDP was significantly elevated at 32 mmHg. 3.  Successful angioplasty and drug-eluting stent placement to the mid LAD.   Recommendations: Dual antiplatelet therapy for at least 12 months. Aggressive treatment of risk factors..  I switched atorvastatin to high-dose rosuvastatin. I switched metoprolol to carvedilol.  An ACE inhibitor or ARB can be resumed once renal function is stable. I requested an echocardiogram. Gentle hydration for renal protection.   Coronary Diagrams   Diagnostic Dominance: Right Intervention        EKG:  EKG is  ordered today.  The ekg ordered today demonstrates NSR,  82, TWI II aVL, V5 and V6  Recent Labs: 10/16/2021: ALT 18; BUN 23; Creatinine, Ser 1.40; Hemoglobin 18.0; Platelets 213; Potassium 4.2; Sodium 134  Recent Lipid Panel     Component Value Date/Time   CHOL 89 (L) 10/09/2021 1046   TRIG 111 10/09/2021 1046   HDL 27 (L) 10/09/2021 1046   CHOLHDL 3.3 10/09/2021 1046   CHOLHDL 5.1 08/27/2021 1243   VLDL 33 08/27/2021 1243   LDLCALC 41 10/09/2021 1046    Physical Exam:    VS:  BP 130/80 (BP Location: Left Arm, Patient Position: Sitting, Cuff Size: Normal)    Pulse 82    Ht 5\' 10"  (1.778 m)    Wt 238 lb 6 oz (108.1 kg)    SpO2 98%    BMI 34.20 kg/m     Wt Readings from Last 3 Encounters:  01/11/22 238 lb 6 oz (108.1 kg)  10/30/21 234 lb 4.8 oz (106.3 kg)  10/09/21 239 lb (108.4 kg)     GEN:  Well nourished, well developed in no acute distress HEENT: Normal NECK: No JVD; No carotid bruits LYMPHATICS: No lymphadenopathy CARDIAC: RRR, no murmurs, rubs, gallops RESPIRATORY:  Clear to auscultation without rales, wheezing or rhonchi  ABDOMEN: Soft, non-tender, non-distended MUSCULOSKELETAL:  No edema; No deformity  SKIN: Warm and dry NEUROLOGIC:  Alert and oriented x 3 PSYCHIATRIC:  Normal affect   ASSESSMENT:    1. Coronary artery disease involving native coronary artery of native heart with angina pectoris ([place])   2. Obstructive sleep apnea syndrome   3. History of renal transplant   4. Hyperlipidemia, mixed   5. Essential hypertension    PLAN:    In order of problems listed above:  CAD with STEMI s/p DES to mLAD 08/2021 Patient denies anginal symptoms. He finished cardiac rehab and is now working out at his gym 4 times weekly. Diet is good. No further ischemic work-up at this time. Continue DAPT with Aspirin and Effient x 1 year. Continue Coreg and statin. He has SL NTG. OK to take Viagra, however not within 24 hours of SL NTG. He is not on long acting nitroglycerin.    HLD LDL 41. Continue Statin  HTN BP 130/80. I will increase Coreg to 12.5mg  BID. Continue Telmisartan 20mg  daily.  H/o renal transplant Follows closely with nephrology who check labs every 3 months.    OSA Reports  compliance with CPAP  Disposition: Follow up in 3 month(s) with MD/APP     Signed, Doc Mandala Ninfa Meeker, PA-C  01/11/2022 3:38 PM    [facility]

## 2022-01-11 ENCOUNTER — Ambulatory Visit (INDEPENDENT_AMBULATORY_CARE_PROVIDER_SITE_OTHER): Payer: BC Managed Care – PPO | Admitting: Medical

## 2022-01-11 ENCOUNTER — Encounter: Payer: Self-pay | Admitting: Medical

## 2022-01-11 ENCOUNTER — Other Ambulatory Visit: Payer: Self-pay

## 2022-01-11 VITALS — BP 130/80 | HR 82 | Ht 70.0 in | Wt 238.4 lb

## 2022-01-11 DIAGNOSIS — G4733 Obstructive sleep apnea (adult) (pediatric): Secondary | ICD-10-CM

## 2022-01-11 DIAGNOSIS — Z940 Kidney transplant status: Secondary | ICD-10-CM | POA: Diagnosis not present

## 2022-01-11 DIAGNOSIS — I25119 Atherosclerotic heart disease of native coronary artery with unspecified angina pectoris: Secondary | ICD-10-CM | POA: Diagnosis not present

## 2022-01-11 DIAGNOSIS — E782 Mixed hyperlipidemia: Secondary | ICD-10-CM | POA: Diagnosis not present

## 2022-01-11 DIAGNOSIS — I10 Essential (primary) hypertension: Secondary | ICD-10-CM

## 2022-01-11 MED ORDER — CARVEDILOL 12.5 MG PO TABS: 12.5000 mg | ORAL_TABLET | Freq: Two times a day (BID) | ORAL | 3 refills | Status: DC

## 2022-01-11 NOTE — Patient Instructions (Signed)
Medication Instructions:  Your physician has recommended you make the following change in your medication:   INCREASE carvedilol (Coreg) to 12.5 mg twice daily   *If you need a refill on your cardiac medications before your next appointment, please call your pharmacy*   Lab Work: None ordered  If you have labs (blood work) drawn today and your tests are completely normal, you will receive your results only by: [place] (if you have MyChart) OR A paper copy in the mail If you have any lab test that is abnormal or we need to change your treatment, we will call you to review the results.   Testing/Procedures: None ordered   Follow-Up: At [facility], you and your health needs are our priority.  As part of our continuing mission to provide you with exceptional heart care, we have created designated Provider Care Teams.  These Care Teams include your primary Cardiologist (physician) and Advanced Practice Providers (APPs -  Physician Assistants and Nurse Practitioners) who all work together to provide you with the care you need, when you need it.  We recommend signing up for the patient portal called "MyChart".  Sign up information is provided on this After Visit Summary.  MyChart is used to connect with patients for Virtual Visits (Telemedicine).  Patients are able to view lab/test results, encounter notes, upcoming appointments, etc.  Non-urgent messages can be sent to your provider as well.   To learn more about what you can do with MyChart, go to [email or web address].    Your next appointment:   3 month(s)  The format for your next appointment:   In Person  Provider:   You may see one of the following Advanced Practice Providers on your designated Care Team:   Murray Hodgkins, NP Christell Faith, PA-C Cadence Kathlen Mody, PA-C  :1}    Other Instructions N/A

## 2022-01-22 ENCOUNTER — Other Ambulatory Visit: Payer: Self-pay | Admitting: *Deleted

## 2022-01-22 DIAGNOSIS — Z79899 Other long term (current) drug therapy: Secondary | ICD-10-CM | POA: Diagnosis not present

## 2022-01-22 DIAGNOSIS — Z940 Kidney transplant status: Secondary | ICD-10-CM | POA: Diagnosis not present

## 2022-01-22 DIAGNOSIS — I2510 Atherosclerotic heart disease of native coronary artery without angina pectoris: Secondary | ICD-10-CM | POA: Diagnosis not present

## 2022-01-22 DIAGNOSIS — I10 Essential (primary) hypertension: Secondary | ICD-10-CM | POA: Diagnosis not present

## 2022-01-22 MED ORDER — SILDENAFIL CITRATE 100 MG PO TABS: ORAL_TABLET | ORAL | 2 refills | Status: DC

## 2022-03-29 ENCOUNTER — Encounter: Payer: Self-pay | Admitting: Medical

## 2022-03-29 ENCOUNTER — Ambulatory Visit (INDEPENDENT_AMBULATORY_CARE_PROVIDER_SITE_OTHER): Payer: BC Managed Care – PPO | Admitting: Medical

## 2022-03-29 VITALS — BP 138/84 | HR 62 | Ht 70.0 in | Wt 242.0 lb

## 2022-03-29 DIAGNOSIS — G4733 Obstructive sleep apnea (adult) (pediatric): Secondary | ICD-10-CM | POA: Diagnosis not present

## 2022-03-29 DIAGNOSIS — I25119 Atherosclerotic heart disease of native coronary artery with unspecified angina pectoris: Secondary | ICD-10-CM | POA: Diagnosis not present

## 2022-03-29 DIAGNOSIS — E782 Mixed hyperlipidemia: Secondary | ICD-10-CM

## 2022-03-29 DIAGNOSIS — Z940 Kidney transplant status: Secondary | ICD-10-CM | POA: Diagnosis not present

## 2022-03-29 DIAGNOSIS — I10 Essential (primary) hypertension: Secondary | ICD-10-CM

## 2022-03-29 MED ORDER — CARVEDILOL 12.5 MG PO TABS: 12.5000 mg | ORAL_TABLET | Freq: Two times a day (BID) | ORAL | 3 refills | Status: AC

## 2022-03-29 MED ORDER — ROSUVASTATIN CALCIUM 40 MG PO TABS: 40.0000 mg | ORAL_TABLET | Freq: Every day | ORAL | 3 refills | Status: DC

## 2022-03-29 MED ORDER — TELMISARTAN 20 MG PO TABS: 20.0000 mg | ORAL_TABLET | Freq: Every day | ORAL | 3 refills | Status: DC

## 2022-03-29 NOTE — Progress Notes (Signed)
?Cardiology Office Note:   ? ?Date:  03/29/2022  ? ?ID:  Chad Mccann, DOB [birth year], MRN [id number] ? ?PCP:  [name], MD  ?[facility] Cardiologist: Dr. Fletcher Anon ?[place] Electrophysiologist:  None  ? ?Referring MD: [name], MD  ? ?Chief Complaint: 3 month follow-up ? ?History of Present Illness:   ? ?Chad Mccann is a 56 y.o. 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  ?  ?Patient was admitted earlier this month for anterior STEMI s/p DES to mLAD. Echo showed LVEF 55-60%, G2DD, trivial MR. Insurance would not cover Brilinta. He was transitioned to Effient. He was started on Aspirin, coreg, Crestor.  ?  ?Last seen 10/09/21 and had not started cardiac rehab. Denied anginal symptoms. No changes. Awaiting pulmonology visit for OSA testing.  ? ?Today, the patient reports he has been doing well. He needs refills of his cardiac medications. He denies chest pain or shortness of breath. He has not seen pulmonology bc he had possible COVID. He plans on seeing pulmonology in the near future. No SOB, LLE., orthopnea, pnd. He did a couple episodes of cardiac rehab, now he goes to the local gym three times a week.  ? ? ?Past Medical History:  ?Diagnosis Date  ? Benign prostatic hypertrophy   ? Bladder spasm   ? Erectile dysfunction   ? Frequency   ? GERD (gastroesophageal reflux disease)   ? Hypertension   ? Hypogonadism in male   ? Kidney 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   ? Kidney, malrotation   ? MGUS (monoclonal gammopathy of unknown significance)   ? Monoclonal paraproteinemia   ? Obesity, unspecified   ? Other specified congenital anomaly of kidney   ? Other specified disorders of bladder   ? Other testicular hypofunction   ? Premature ejaculation   ? Sleep apnea   ? Ventral hernia   ? ? ?Past Surgical History:  ?Procedure Laterality Date  ? BACK SURGERY    ? c5/6 fusion after discectomy 3 weeks prior  ? COLONOSCOPY     ? polyp removal  ? CORONARY/GRAFT ACUTE MI REVASCULARIZATION N/A 08/27/2021  ? Procedure: Coronary/Graft Acute MI Revascularization;  Surgeon: Wellington Hampshire, MD;  Location: [place] [facility];  Service: Cardiovascular;  Laterality: N/A;  ? dialysis port placement    ? and removal  ? HERNIA REPAIR  12/29/13  ? ventral  ? KIDNEY TRANSPLANT  01/2012  ? LEFT HEART CATH AND CORONARY ANGIOGRAPHY N/A 08/27/2021  ? Procedure: LEFT HEART CATH AND CORONARY ANGIOGRAPHY;  Surgeon: Wellington Hampshire, MD;  Location: [place] [facility];  Service: Cardiovascular;  Laterality: N/A;  ? SPINE SURGERY    ? cervical fusion  ? TOTAL HIP ARTHROPLASTY Left 03/02/2019  ? Procedure: TOTAL HIP ARTHROPLASTY ANTERIOR APPROACH-LEFT;  Surgeon: Hessie Knows, MD;  Location: [facility];  Service: Orthopedics;  Laterality: Left;  ? ? ?Current Medications: ?Current Meds  ?Medication Sig  ? acetaminophen (TYLENOL) 325 MG tablet Take 1-2 tablets (325-650 mg total) by mouth every 6 (six) hours as needed for mild pain (pain score 1-3 or temp > 100.5).  ? aspirin 81 MG chewable tablet Chew 1 tablet (81 mg total) by mouth daily.  ? carvedilol (COREG) 12.5 MG tablet Take 1 tablet (12.5 mg total) by mouth 2 (two) times daily with a meal.  ? clonazePAM (KLONOPIN) 0.5 MG tablet Take 0.5 mg by  mouth at bedtime.  ? mycophenolate (CELLCEPT) 250 MG capsule Take 750 mg by mouth 2 (two) times daily.  ? nitroGLYCERIN (NITROSTAT) 0.4 MG SL tablet Place 1 tablet (0.4 mg total) under the tongue every 5 (five) minutes as needed for chest pain.  ? prasugrel (EFFIENT) 10 MG TABS tablet Take 1 tablet (10 mg total) by mouth daily.  ? rosuvastatin (CRESTOR) 40 MG tablet Take 1 tablet (40 mg total) by mouth daily.  ? sildenafil (VIAGRA) 100 MG tablet take 1 tablet 30 minutes to 2 hours prior to intercourse on an empty stomach.  Do not use within 24 hours of nitroglycerin tablets.  ? tacrolimus (PROGRAF) 1 MG capsule Take 2 mg by mouth 2 (two) times daily.  ? telmisartan  (MICARDIS) 20 MG tablet Take 20 mg by mouth daily.  ? testosterone cypionate (DEPOTESTOSTERONE CYPIONATE) 200 MG/ML injection Inject 0.75  mL into the muscle every 14 days  ?  ? ?Allergies:   Hydralazine, Morphine and related, Prednisone, and Shellfish allergy  ? ?Social History  ? ?Socioeconomic History  ? Marital status: Married  ?  Spouse name: Not on file  ? Number of children: Not on file  ? Years of education: Not on file  ? Highest education level: Not on file  ?Occupational History  ? Occupation: Hotel manager  ?Tobacco Use  ? Smoking status: Former  ?  Types: Cigars  ?  Quit date: 08/27/2021  ?  Years since quitting: 0.5  ? Smokeless tobacco: Never  ?Vaping Use  ? Vaping Use: Never used  ?Substance and Sexual Activity  ? Alcohol use: Yes  ?  Alcohol/week: 0.0 standard drinks  ?  Comment: occ  ? Drug use: No  ? Sexual activity: Yes  ?Other Topics Concern  ? Not on file  ?Social History Narrative  ? Not on file  ? ?Social Determinants of Health  ? ?Financial Resource Strain: Not on file  ?Food Insecurity: Not on file  ?Transportation Needs: Not on file  ?Physical Activity: Not on file  ?Stress: Not on file  ?Social Connections: Not on file  ?  ? ?Family History: ?The patient's family history includes Colon cancer in his maternal grandfather; Colon cancer (age of onset: 26) in his father; Heart attack in his father; Stroke in his brother. There is no history of Kidney disease, Prostate cancer, Kidney cancer, or Bladder Cancer. ? ?ROS:   ?Please see the history of present illness.    ? All other systems reviewed and are negative. ? ?EKGs/Labs/Other Studies Reviewed:   ? ?The following studies were reviewed today: ? ?  ?Echo 08/2021 ? 1. Left ventricular ejection fraction, by estimation, is 55 to 60%. The  ?left ventricle has normal function. The left ventricle has no regional  ?wall motion abnormalities. Left ventricular diastolic parameters are  ?consistent with Grade II diastolic  ?dysfunction  (pseudonormalization).  ? 2. Right ventricular systolic function is normal. The right ventricular  ?size is normal.  ? 3. The mitral valve is normal in structure. Trivial mitral valve  ?regurgitation. No evidence of mitral stenosis.  ? 4. The aortic valve is tricuspid. Aortic valve regurgitation is not  ?visualized. No aortic stenosis is present.  ? 5. The inferior vena cava is normal in size with greater than 50%  ?respiratory variability, suggesting right atrial pressure of 3 mmHg.  ?  ?Cardiac cath 08/2021 ?  Mid LM to Dist LM lesion is 30% stenosed. ?  Mid LAD lesion is 99% stenosed. ?  Ost LAD lesion is 30% stenosed. ?  Prox RCA lesion is 30% stenosed. ?  Prox RCA to Mid RCA lesion is 40% stenosed. ?  RPDA lesion is 30% stenosed. ?  A drug-eluting stent was successfully placed using a STENT ONYX FRONTIER 3.0X22. ?  Post intervention, there is a 0% residual stenosis. ?  ?1.  Severe one-vessel coronary artery disease with 99% thrombotic stenosis in the mid LAD which is the culprit for anterior STEMI.  Mild to moderate left main and RCA disease. ?2.  Left ventricular angiography was not performed due to chronic kidney disease.  LVEDP was significantly elevated at 32 mmHg. ?3.  Successful angioplasty and drug-eluting stent placement to the mid LAD. ?  ?Recommendations: ?Dual antiplatelet therapy for at least 12 months. ?Aggressive treatment of risk factors..  I switched atorvastatin to high-dose rosuvastatin. ?I switched metoprolol to carvedilol.  An ACE inhibitor or ARB can be resumed once renal function is stable. ?I requested an echocardiogram. ?Gentle hydration for renal protection. ?  ?Coronary Diagrams ?  ?Diagnostic ?Dominance: Right ?Intervention ?  ?  ?  ? ?EKG:  EKG is not ordered today ? ?Recent Labs: ?10/16/2021: ALT 18; BUN 23; Creatinine, Ser 1.40; Hemoglobin 18.0; Platelets 213; Potassium 4.2; Sodium 134  ?Recent Lipid Panel ?   ?Component Value Date/Time  ? CHOL 89 (L) 10/09/2021 1046  ? TRIG 111  10/09/2021 1046  ? HDL 27 (L) 10/09/2021 1046  ? CHOLHDL 3.3 10/09/2021 1046  ? CHOLHDL 5.1 08/27/2021 1243  ? VLDL 33 08/27/2021 1243  ? [place] 41 10/09/2021 1046  ? ? ? ?Physical Exam:   ? ?VS:  BP 138/84 (BP Locat

## 2022-03-29 NOTE — Patient Instructions (Signed)
Medication Instructions:  ?No changes at this time.  ? ?*If you need a refill on your cardiac medications before your next appointment, please call your pharmacy* ? ? ?Lab Work: ?Lipid panel today ? ?If you have labs (blood work) drawn today and your tests are completely normal, you will receive your results only by: ?MyChart Message (if you have MyChart) OR ?A paper copy in the mail ?If you have any lab test that is abnormal or we need to change your treatment, we will call you to review the results. ? ? ?Testing/Procedures: ?None ? ? ?Follow-Up: ?At [facility], you and your health needs are our priority.  As part of our continuing mission to provide you with exceptional heart care, we have created designated Provider Care Teams.  These Care Teams include your primary Cardiologist (physician) and Advanced Practice Providers (APPs -  Physician Assistants and Nurse Practitioners) who all work together to provide you with the care you need, when you need it. ? ? ?Your next appointment:   ?6 month(s) ? ?The format for your next appointment:   ?In Person ? ?Provider:   ?Kathlyn Sacramento, MD or Cadence Kathlen Mody, [place] ?

## 2022-03-30 LAB — LIPID PANEL
Chol/HDL Ratio: 6.3 ratio — ABNORMAL HIGH (ref 0.0–5.0)
Cholesterol, Total: 182 mg/dL (ref 100–199)
HDL: 29 mg/dL — ABNORMAL LOW (ref >39)
LDL Chol Calc (NIH): 107 mg/dL — ABNORMAL HIGH (ref 0–99)
Triglycerides: 265 mg/dL — ABNORMAL HIGH (ref 0–149)
VLDL Cholesterol Cal: 46 mg/dL — ABNORMAL HIGH (ref 5–40)

## 2022-04-02 ENCOUNTER — Telehealth: Payer: Self-pay

## 2022-04-02 NOTE — Telephone Encounter (Addendum)
Patient made aware of results and Cadence Furth, [place] recommendation. ?Patient sts that he ran out of his rosuvastatin and had taken it in over 10 days. ?He has since resumed taking it daily. Adv the patient that I will fwd the update Cadence and call back if she has further recommendation. ? ?Patient voiced appreciation for the call. ?

## 2022-04-02 NOTE — Telephone Encounter (Signed)
-----   Message from [place], PA-C sent at 04/01/2022  7:59 PM EDT ----- ?Lipid panel worse than prior. Lets confirm he is taking Crestor '40mg'$  daily. IF yes, lest add on Zetia. Also I think he would benefit fom PCSK9i, if he is OK with this ?

## 2022-04-03 NOTE — Telephone Encounter (Signed)
Called to make the patient aware of Cadence's recommendation. Lmtcb. ?

## 2022-04-03 NOTE — Telephone Encounter (Signed)
Furth, Cadence H, PA-C  You 7 minutes ago (8:55 AM)  ? ?Lets add Zetia daily. Recommend lifestyle changes  ? ?

## 2022-04-11 ENCOUNTER — Ambulatory Visit: Payer: BC Managed Care – PPO | Admitting: Medical

## 2022-04-29 NOTE — Telephone Encounter (Signed)
2nd attempt to contact the patient. Lmtcb. ?

## 2022-05-02 DIAGNOSIS — B079 Viral wart, unspecified: Secondary | ICD-10-CM | POA: Diagnosis not present

## 2022-05-02 DIAGNOSIS — B070 Plantar wart: Secondary | ICD-10-CM | POA: Diagnosis not present

## 2022-05-02 DIAGNOSIS — L821 Other seborrheic keratosis: Secondary | ICD-10-CM | POA: Diagnosis not present

## 2022-05-02 DIAGNOSIS — L578 Other skin changes due to chronic exposure to nonionizing radiation: Secondary | ICD-10-CM | POA: Diagnosis not present

## 2022-05-02 DIAGNOSIS — L820 Inflamed seborrheic keratosis: Secondary | ICD-10-CM | POA: Diagnosis not present

## 2022-05-16 ENCOUNTER — Telehealth: Payer: Self-pay | Admitting: Urology

## 2022-05-16 NOTE — Telephone Encounter (Signed)
Sent pt mychart msg. See separate encounter.

## 2022-05-16 NOTE — Telephone Encounter (Signed)
Chad Mccann is requesting a refill on his testosterone, but I will need cardiac clearance in order to restart TRT.  If his cardiologist approves the restarting of his TRT, we will need to see him for PSA, testosterone, H & H,  I PSS, SHIM and exam.

## 2022-05-29 ENCOUNTER — Telehealth: Payer: Self-pay

## 2022-05-29 ENCOUNTER — Telehealth: Payer: Self-pay | Admitting: Cardiovascular Disease

## 2022-05-29 DIAGNOSIS — M25522 Pain in left elbow: Secondary | ICD-10-CM | POA: Diagnosis not present

## 2022-05-29 DIAGNOSIS — G5622 Lesion of ulnar nerve, left upper limb: Secondary | ICD-10-CM | POA: Diagnosis not present

## 2022-05-29 NOTE — Telephone Encounter (Signed)
Pt c/o medication issue:  1. Name of Medication: Testosterone replacement  2. How are you currently taking this medication (dosage and times per day)?   3. Are you having a reaction (difficulty breathing--STAT)?   4. What is your medication issue? Fax sent from [facility] requesting a restart of testosterone replacement therapy for patient, please assist  [phone]

## 2022-05-29 NOTE — Telephone Encounter (Signed)
Cardiac clearance for TRT submitted to cardiology 05/29/22.

## 2022-06-04 NOTE — Telephone Encounter (Signed)
Msg fwd to Dr. Fletcher Anon to advise.

## 2022-06-05 NOTE — Telephone Encounter (Signed)
It is fine to resume testosterone therapy from a cardiac standpoint.

## 2022-06-06 ENCOUNTER — Other Ambulatory Visit: Payer: Self-pay

## 2022-06-06 ENCOUNTER — Telehealth: Payer: Self-pay | Admitting: Urology

## 2022-06-06 ENCOUNTER — Other Ambulatory Visit: Payer: Self-pay | Admitting: Urology

## 2022-06-06 DIAGNOSIS — N138 Other obstructive and reflux uropathy: Secondary | ICD-10-CM

## 2022-06-06 DIAGNOSIS — E349 Endocrine disorder, unspecified: Secondary | ICD-10-CM

## 2022-06-06 DIAGNOSIS — E291 Testicular hypofunction: Secondary | ICD-10-CM

## 2022-06-06 NOTE — Telephone Encounter (Signed)
We have cardiac clearance for him to restart your testosterone, but we will need baseline labs prior to starting (PSA, H&H and testosterone)

## 2022-06-07 ENCOUNTER — Other Ambulatory Visit: Payer: BC Managed Care – PPO

## 2022-06-11 ENCOUNTER — Other Ambulatory Visit: Payer: BC Managed Care – PPO

## 2022-06-11 DIAGNOSIS — E349 Endocrine disorder, unspecified: Secondary | ICD-10-CM

## 2022-06-12 ENCOUNTER — Telehealth: Payer: Self-pay

## 2022-06-12 LAB — HEMOGLOBIN AND HEMATOCRIT, BLOOD
Hematocrit: 47.7 % (ref 37.5–51.0)
Hemoglobin: 16.8 g/dL (ref 13.0–17.7)

## 2022-06-12 LAB — TESTOSTERONE: Testosterone: 239 ng/dL — ABNORMAL LOW (ref 264–916)

## 2022-06-12 LAB — PSA: Prostate Specific Ag, Serum: 1.2 ng/mL (ref 0.0–4.0)

## 2022-06-12 MED ORDER — TESTOSTERONE CYPIONATE 200 MG/ML IM SOLN
INTRAMUSCULAR | 0 refills | Status: DC
Start: 2022-06-12 — End: 2022-10-01

## 2022-06-12 NOTE — Telephone Encounter (Signed)
-----   Message from Nori Riis, PA-C sent at 06/12/2022  7:15 AM EDT ----- Please let Chad Mccann know that his labs were within acceptable limits and I went ahead and refilled his testosterone cypionate.  We will need to schedule repeat labs 1 week after his fourth injection, testosterone hemoglobin and hematocrit.

## 2022-06-12 NOTE — Telephone Encounter (Signed)
Pt notified via Mychart

## 2022-08-01 ENCOUNTER — Other Ambulatory Visit: Payer: BC Managed Care – PPO

## 2022-08-02 ENCOUNTER — Encounter: Payer: Self-pay | Admitting: Urology

## 2022-08-16 ENCOUNTER — Other Ambulatory Visit: Payer: BC Managed Care – PPO

## 2022-08-16 ENCOUNTER — Other Ambulatory Visit: Payer: Self-pay

## 2022-08-16 DIAGNOSIS — E349 Endocrine disorder, unspecified: Secondary | ICD-10-CM

## 2022-08-19 ENCOUNTER — Other Ambulatory Visit: Payer: BC Managed Care – PPO

## 2022-08-19 ENCOUNTER — Encounter: Payer: Self-pay | Admitting: Urology

## 2022-08-28 ENCOUNTER — Other Ambulatory Visit: Payer: BC Managed Care – PPO

## 2022-09-11 ENCOUNTER — Other Ambulatory Visit: Payer: BC Managed Care – PPO

## 2022-09-11 ENCOUNTER — Encounter: Payer: Self-pay | Admitting: Urology

## 2022-09-24 ENCOUNTER — Other Ambulatory Visit: Payer: BC Managed Care – PPO

## 2022-09-24 DIAGNOSIS — E349 Endocrine disorder, unspecified: Secondary | ICD-10-CM

## 2022-09-25 LAB — HEMOGLOBIN AND HEMATOCRIT, BLOOD
Hematocrit: 47.9 % (ref 37.5–51.0)
Hemoglobin: 16.4 g/dL (ref 13.0–17.7)

## 2022-09-25 LAB — TESTOSTERONE: Testosterone: 270 ng/dL (ref 264–916)

## 2022-09-26 ENCOUNTER — Telehealth: Payer: Self-pay

## 2022-09-26 NOTE — Telephone Encounter (Signed)
-----   Message from Nori Riis, PA-C sent at 09/25/2022  4:40 PM EDT ----- Chad Mccann has not been seen in over one year.  He needs an office visit with me for I PSS, SHIM and exam.

## 2022-09-26 NOTE — Telephone Encounter (Signed)
Pt scheduled, pt confirmed.

## 2022-09-30 NOTE — Progress Notes (Unsigned)
3:30 PM   Chad Mccann 06/22/1966 [id number]  Referring provider: [name], MD [address] [place],  [place] [address]  Urological history: 1. 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 Testosterone deficiency -Contributing factors of age, obesity and sleep apnea -testosterone (09/24/2022) 270 -hemoglobin and HCT (09/24/2022) 16.4/47.9 -managed with testosterone cypionate 200 cc/mg, 0.75 cc every 14 days   3. ED -contributing factors of hyperlipidemia, HTN, BPH, testosterone deficiency and age -SHIM 41 -managed with tadalafil 20 mg, on-demand-dosing  4. BPH with LU TS -PSA (05/2022) 1.2 -I PSS 6/1  5. Sleep apnea -Risk factors for nocturia: obstructive sleep apnea, hypertension, diabetes, heart disease, anxiety and BPH.    -sleeps with CPAP   6. Premature ejaculation   CC:  6 months follow up   HPI: Chad Mccann is a 56 y.o. male who presents today for 6 months follow up.       He is still having issues w/ premature ejaculation.  The lidocaine numbing cream was not effective.  Patient still having spontaneous erections.  He denies any pain or curvature with erections.     SHIM     Row Name 10/01/22 1441         SHIM: Over the last 6 months:   How do you rate your confidence that you could get and keep an erection? Moderate     When you had erections with sexual stimulation, how often were your erections hard enough for penetration (entering your partner)? Sometimes (about half the time)     During sexual intercourse, how often were you able to maintain your erection after you had penetrated (entered) your partner? Most Times (much more than half the time)     During sexual intercourse, how difficult was it to maintain your erection to completion of intercourse? Difficult     When you attempted sexual intercourse, how often was  it satisfactory for you? Most Times (much more than half the time)       SHIM Total Score   SHIM 17               Score: 1-7 Severe ED 8-11 Moderate ED 12-16 Mild-Moderate ED 17-21 Mild ED 22-25 No ED  He has no urinary complaints at this time.  Patient denies any modifying or aggravating factors.  Patient denies any gross hematuria, dysuria or suprapubic/flank pain.  Patient denies any fevers, chills, nausea or vomiting.     IPSS     Row Name 10/01/22 1400         International Prostate Symptom Score   How often have you had the sensation of not emptying your bladder? Not at All     How often have you had to urinate less than every two hours? Less than half the time     How often have you found you stopped and started again several times when you urinated? Less than 1 in 5 times     How often have you found it difficult to postpone urination? Less than half the time     How often have you had a weak urinary stream? Not at All     How often have you had to strain to start urination? Not at All     How many times did you typically get up at night to urinate? 1 Time  Total IPSS Score 6       Quality of Life due to urinary symptoms   If you were to spend the rest of your life with your urinary condition just the way it is now how would you feel about that? Pleased               Score:  1-7 Mild 8-19 Moderate 20-35 Severe   PMH: Past Medical History:  Diagnosis Date   Benign prostatic hypertrophy    Bladder spasm    Erectile dysfunction    Frequency    GERD (gastroesophageal reflux disease)    Hypertension    Hypogonadism in male    Kidney 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    Kidney, malrotation    MGUS (monoclonal gammopathy of unknown significance)    Monoclonal paraproteinemia    Obesity, unspecified    Other specified congenital anomaly of kidney    Other specified disorders of bladder    Other testicular hypofunction    Premature ejaculation    Sleep  apnea    Ventral hernia     Surgical History: Past Surgical History:  Procedure Laterality Date   BACK SURGERY     c5/6 fusion after discectomy 3 weeks prior   COLONOSCOPY     polyp removal   CORONARY/GRAFT ACUTE MI REVASCULARIZATION N/A 08/27/2021   Procedure: Coronary/Graft Acute MI Revascularization;  Surgeon: Wellington Hampshire, MD;  Location: [place] [facility];  Service: Cardiovascular;  Laterality: N/A;   dialysis port placement     and removal   HERNIA REPAIR  12/29/13   ventral   KIDNEY TRANSPLANT  01/2012   LEFT HEART CATH AND CORONARY ANGIOGRAPHY N/A 08/27/2021   Procedure: LEFT HEART CATH AND CORONARY ANGIOGRAPHY;  Surgeon: Wellington Hampshire, MD;  Location: [place] [facility];  Service: Cardiovascular;  Laterality: N/A;   SPINE SURGERY     cervical fusion   TOTAL HIP ARTHROPLASTY Left 03/02/2019   Procedure: TOTAL HIP ARTHROPLASTY ANTERIOR APPROACH-LEFT;  Surgeon: Hessie Knows, MD;  Location: [facility];  Service: Orthopedics;  Laterality: Left;    Home Medications:  Allergies as of 10/01/2022       Reactions   Hydralazine Other (See Comments)   Nose bleeds/light headed   Morphine And Related Other (See Comments)   "difficulty waking patient"   Prednisone    hallucinations   Shellfish Allergy Nausea And Vomiting        Medication List        Accurate as of October 01, 2022  3:30 PM. If you have any questions, ask your nurse or doctor.          acetaminophen 325 MG tablet Commonly known as: TYLENOL Take 1-2 tablets (325-650 mg total) by mouth every 6 (six) hours as needed for mild pain (pain score 1-3 or temp > 100.5).   aspirin 81 MG chewable tablet Chew 1 tablet (81 mg total) by mouth daily.   carvedilol 12.5 MG tablet Commonly known as: COREG Take 1 tablet (12.5 mg total) by mouth 2 (two) times daily with a meal.   clonazePAM 0.5 MG tablet Commonly known as: KLONOPIN Take 0.5 mg by mouth at bedtime.   mycophenolate 250 MG capsule Commonly  known as: CELLCEPT Take 750 mg by mouth 2 (two) times daily.   nitroGLYCERIN 0.4 MG SL tablet Commonly known as: NITROSTAT Place 1 tablet (0.4 mg total) under the tongue every 5 (five) minutes as needed for chest pain.   PARoxetine 10 MG tablet  Commonly known as: PAXIL Take 1 tablet (10 mg total) by mouth daily. Started by: Zara Council, PA-C   prasugrel 10 MG Tabs tablet Commonly known as: EFFIENT Take 1 tablet (10 mg total) by mouth daily.   rosuvastatin 40 MG tablet Commonly known as: CRESTOR Take 1 tablet (40 mg total) by mouth daily.   sildenafil 100 MG tablet Commonly known as: VIAGRA take 1 tablet 30 minutes to 2 hours prior to intercourse on an empty stomach.  Do not use within 24 hours of nitroglycerin tablets.   tacrolimus 1 MG capsule Commonly known as: PROGRAF Take 2 mg by mouth 2 (two) times daily.   telmisartan 20 MG tablet Commonly known as: MICARDIS Take 1 tablet (20 mg total) by mouth daily.   testosterone cypionate 200 MG/ML injection Commonly known as: DEPOTESTOSTERONE CYPIONATE Inject 0.75  mL into the muscle every 14 days        Allergies:  Allergies  Allergen Reactions   Hydralazine Other (See Comments)    Nose bleeds/light headed   Morphine And Related Other (See Comments)    "difficulty waking patient"   Prednisone     hallucinations   Shellfish Allergy Nausea And Vomiting    Family History: Family History  Problem Relation Age of Onset   Colon cancer Father 68   Heart attack Father    Stroke Brother    Colon cancer Maternal Grandfather    Kidney disease Neg Hx    Prostate cancer Neg Hx    Kidney cancer Neg Hx    Bladder Cancer Neg Hx     Social History:  reports that he quit smoking about 13 months ago. His smoking use included cigars. He has never used smokeless tobacco. He reports current alcohol use. He reports that he does not use drugs.  ROS: For pertinent review of systems please refer to history of present  illness  Physical Exam: BP (!) 148/85   Pulse 73   Ht '5\' 10"'$  (1.778 m)   Wt 238 lb (108 kg)   BMI 34.15 kg/m   Constitutional:  Well nourished. Alert and oriented, No acute distress. HEENT: [place] AT, moist mucus membranes.  Trachea midline Cardiovascular: No clubbing, cyanosis, or edema. Respiratory: Normal respiratory effort, no increased work of breathing. GU: No CVA tenderness.  No bladder fullness or masses.  Patient with uncircumcised phallus. Foreskin easily retracted  Urethral meatus is patent.  No penile discharge. No penile lesions or rashes. Scrotum without lesions, cysts, rashes and/or edema.  Testicles are located scrotally bilaterally. No masses are appreciated in the testicles. Left and right epididymis are normal. Rectal: Patient with  normal sphincter tone. Anus and perineum without scarring or rashes. No rectal masses are appreciated. Prostate is approximately 50 + grams, could only palpate the apex and the midportion of the gland, no nodules are appreciated. Seminal vesicles could not be palpated.  Neurologic: Grossly intact, no focal deficits, moving all 4 extremities. Psychiatric: Normal mood and affect.    Laboratory Data: Component     Latest Ref Rng 09/24/2022  HCT     37.5 - 51.0 % 47.9   Hemoglobin     13.0 - 17.7 g/dL 16.4    Lab Results  Component Value Date   TESTOSTERONE 270 09/24/2022  I have reviewed the labs.  Assessment & Plan:    1. Renal transplant -Followed by nephrology - last seen in 12/2021  2. Testosterone deficiency   -testosterone level is sub therapeutic-but it is at his  trough -Hemoglobin/hematocrit are within normal levels -continue 0.75 cc every 14 days -refill sent to pharmacy  3. BPH with LU TS -continue conservative management  4. Erectile dysfunction:    -continue sildenafil 100 mg, on-demand-dosing   5.  Premature ejaculation -Failed lidocaine cream -Prescription sent for Paxil 10 mg nightly, he will take it for up to 10  days and if he feels that it is not effective and he is not experiencing any side effects, he will increase it to 20 mg nightly  Return in about 6 months (around 04/02/2023) for PSA, testosterone (one week after injection) H & H.  These notes generated with voice recognition software. I apologize for typographical errors.  [place], [place] [address] [place], [place] [address] [phone]

## 2022-10-01 ENCOUNTER — Ambulatory Visit (INDEPENDENT_AMBULATORY_CARE_PROVIDER_SITE_OTHER): Payer: BC Managed Care – PPO | Admitting: Urology

## 2022-10-01 ENCOUNTER — Encounter: Payer: Self-pay | Admitting: Urology

## 2022-10-01 VITALS — BP 148/85 | HR 73 | Ht 70.0 in | Wt 238.0 lb

## 2022-10-01 DIAGNOSIS — E291 Testicular hypofunction: Secondary | ICD-10-CM

## 2022-10-01 DIAGNOSIS — N529 Male erectile dysfunction, unspecified: Secondary | ICD-10-CM | POA: Diagnosis not present

## 2022-10-01 DIAGNOSIS — F524 Premature ejaculation: Secondary | ICD-10-CM

## 2022-10-01 DIAGNOSIS — E349 Endocrine disorder, unspecified: Secondary | ICD-10-CM

## 2022-10-01 DIAGNOSIS — Z940 Kidney transplant status: Secondary | ICD-10-CM | POA: Diagnosis not present

## 2022-10-01 MED ORDER — PAROXETINE HCL 10 MG PO TABS: 10.0000 mg | ORAL_TABLET | Freq: Every day | ORAL | 2 refills | Status: DC

## 2022-10-01 MED ORDER — TESTOSTERONE CYPIONATE 200 MG/ML IM SOLN: INTRAMUSCULAR | 0 refills | Status: DC

## 2022-11-07 ENCOUNTER — Ambulatory Visit: Payer: BC Managed Care – PPO | Admitting: Medical

## 2022-11-27 ENCOUNTER — Ambulatory Visit: Payer: BC Managed Care – PPO | Admitting: Medical

## 2022-11-29 ENCOUNTER — Ambulatory Visit: Payer: BC Managed Care – PPO | Attending: Medical | Admitting: Medical

## 2022-11-29 ENCOUNTER — Encounter: Payer: Self-pay | Admitting: Medical

## 2022-11-29 VITALS — BP 158/90 | HR 65 | Ht 70.0 in | Wt 247.5 lb

## 2022-11-29 DIAGNOSIS — I10 Essential (primary) hypertension: Secondary | ICD-10-CM | POA: Diagnosis not present

## 2022-11-29 DIAGNOSIS — Z940 Kidney transplant status: Secondary | ICD-10-CM

## 2022-11-29 DIAGNOSIS — I25119 Atherosclerotic heart disease of native coronary artery with unspecified angina pectoris: Secondary | ICD-10-CM

## 2022-11-29 DIAGNOSIS — E782 Mixed hyperlipidemia: Secondary | ICD-10-CM

## 2022-11-29 MED ORDER — TELMISARTAN 40 MG PO TABS: 40.0000 mg | ORAL_TABLET | Freq: Every day | ORAL | 3 refills | Status: DC

## 2022-11-29 NOTE — Patient Instructions (Addendum)
Medication Instructions:  - Your physician has recommended you make the following change in your medication:   1) STOP Effient  2) INCREASE Telmisartan to 40 mg: - take 1 tablet by mouth once daily   *If you need a refill on your cardiac medications before your next appointment, please call your pharmacy*   Lab Work: - none ordered  If you have labs (blood work) drawn today and your tests are completely normal, you will receive your results only by: [place] (if you have MyChart) OR A paper copy in the mail If you have any lab test that is abnormal or we need to change your treatment, we will call you to review the results.   Testing/Procedures: - You have been referred to : [place] should receive a call from the [facility] scheduler to arrange for this appointment in our [place] office. If it has been more than 3 weeks and you have not heard from them, please call us back at this office at [phone] or send a MyChart message so we can follow up     Follow-Up: At [facility], you and your health needs are our priority.  As part of our continuing mission to provide you with exceptional heart care, we have created designated Provider Care Teams.  These Care Teams include your primary Cardiologist (physician) and Advanced Practice Providers (APPs -  Physician Assistants and Nurse Practitioners) who all work together to provide you with the care you need, when you need it.  We recommend signing up for the patient portal called "MyChart".  Sign up information is provided on this After Visit Summary.  MyChart is used to connect with patients for Virtual Visits (Telemedicine).  Patients are able to view lab/test results, encounter notes, upcoming appointments, etc.  Non-urgent messages can be sent to your provider as well.   To learn more about what you can do with MyChart, go to [email or web address].    Your next appointment:   6 month(s)  The  format for your next appointment:   In Person  Provider:   You may see Kathlyn Sacramento, MD or one of the following Advanced Practice Providers on your designated Care Team:    Cadence Kathlen Mody, [place]   Other Instructions N/a  Important Information About Sugar

## 2022-11-29 NOTE — Progress Notes (Signed)
Cardiology Office Note:    Date:  11/29/2022   ID:  Chad Mccann, DOB December 27, 1965, MRN [id number]  PCP:  No primary care provider on file.  [facility] Cardiologist:  Kathlyn Sacramento, MD  [facility] Electrophysiologist:  None   Referring MD: Chad Athens, MD   Chief Complaint: 6 month follow-up  History of Present Illness:    Chad Mccann is a 56 y.o. 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    Patient was admitted earlier this month for anterior STEMI s/p DES to mLAD. Echo showed LVEF 55-60%, G2DD, trivial MR. Insurance would not cover Brilinta. He was transitioned to Effient. He was started on Aspirin, coreg, Crestor.   The patient was last seen 03/29/22 and was overall doing well. He needed refill of cardiac meds. He was awaiting to see pulmonology for OSA.  Today, the patient reports he has been doing well from a cardiac perspective. At home 140/90s. He denies chest pain or shortness of breath. NO lower leg edema, orthopnea, pnd, lightheadedness or dizziness. The patient stays active. Cough is gone.   Past Medical History:  Diagnosis Date   Benign prostatic hypertrophy    Bladder spasm    Erectile dysfunction    Frequency    GERD (gastroesophageal reflux disease)    Hypertension    Hypogonadism in male    Kidney 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    Kidney, malrotation    MGUS (monoclonal gammopathy of unknown significance)    Monoclonal paraproteinemia    Obesity, unspecified    Other specified congenital anomaly of kidney    Other specified disorders of bladder    Other testicular hypofunction    Premature ejaculation    Sleep apnea    Ventral hernia     Past Surgical History:  Procedure Laterality Date   BACK SURGERY     c5/6 fusion after discectomy 3 weeks prior   COLONOSCOPY     polyp removal   CORONARY/GRAFT ACUTE MI REVASCULARIZATION N/A 08/27/2021   Procedure: Coronary/Graft  Acute MI Revascularization;  Surgeon: Wellington Hampshire, MD;  Location: [place] [facility];  Service: Cardiovascular;  Laterality: N/A;   dialysis port placement     and removal   HERNIA REPAIR  12/29/13   ventral   KIDNEY TRANSPLANT  01/2012   LEFT HEART CATH AND CORONARY ANGIOGRAPHY N/A 08/27/2021   Procedure: LEFT HEART CATH AND CORONARY ANGIOGRAPHY;  Surgeon: Wellington Hampshire, MD;  Location: [place] [facility];  Service: Cardiovascular;  Laterality: N/A;   SPINE SURGERY     cervical fusion   TOTAL HIP ARTHROPLASTY Left 03/02/2019   Procedure: TOTAL HIP ARTHROPLASTY ANTERIOR APPROACH-LEFT;  Surgeon: Hessie Knows, MD;  Location: [facility];  Service: Orthopedics;  Laterality: Left;    Current Medications: Current Meds  Medication Sig   acetaminophen (TYLENOL) 325 MG tablet Take 1-2 tablets (325-650 mg total) by mouth every 6 (six) hours as needed for mild pain (pain score 1-3 or temp > 100.5).   aspirin 81 MG chewable tablet Chew 1 tablet (81 mg total) by mouth daily.   carvedilol (COREG) 12.5 MG tablet Take 1 tablet (12.5 mg total) by mouth 2 (two) times daily with a meal.   clonazePAM (KLONOPIN) 0.5 MG tablet Take 0.5 mg by mouth at bedtime.   famotidine (PEPCID) 20 MG tablet Take 20 mg by mouth at bedtime.   mycophenolate (CELLCEPT) 250  MG capsule Take 750 mg by mouth 2 (two) times daily.   nitroGLYCERIN (NITROSTAT) 0.4 MG SL tablet Place 1 tablet (0.4 mg total) under the tongue every 5 (five) minutes as needed for chest pain.   PARoxetine (PAXIL) 10 MG tablet Take 1 tablet (10 mg total) by mouth daily.   rosuvastatin (CRESTOR) 40 MG tablet Take 1 tablet (40 mg total) by mouth daily.   sildenafil (VIAGRA) 100 MG tablet take 1 tablet 30 minutes to 2 hours prior to intercourse on an empty stomach.  Do not use within 24 hours of nitroglycerin tablets.   tacrolimus (PROGRAF) 1 MG capsule Take 2 mg by mouth 2 (two) times daily.   telmisartan (MICARDIS) 40 MG tablet Take 1 tablet (40 mg  total) by mouth daily.   testosterone cypionate (DEPOTESTOSTERONE CYPIONATE) 200 MG/ML injection Inject 0.75  mL into the muscle every 14 days   [DISCONTINUED] prasugrel (EFFIENT) 10 MG TABS tablet Take 1 tablet (10 mg total) by mouth daily.   [DISCONTINUED] telmisartan (MICARDIS) 20 MG tablet Take 1 tablet (20 mg total) by mouth daily.     Allergies:   Hydralazine, Morphine and related, Prednisone, and Shellfish allergy   Social History   Socioeconomic History   Marital status: Married    Spouse name: Not on file   Number of children: Not on file   Years of education: Not on file   Highest education level: Not on file  Occupational History   Occupation: Hotel manager  Tobacco Use   Smoking status: Former    Types: Cigars    Quit date: 08/27/2021    Years since quitting: 1.2   Smokeless tobacco: Never  Vaping Use   Vaping Use: Never used  Substance and Sexual Activity   Alcohol use: Yes    Alcohol/week: 0.0 standard drinks of alcohol    Comment: occ   Drug use: No   Sexual activity: Yes  Other Topics Concern   Not on file  Social History Narrative   Not on file   Social Determinants of Health   Financial Resource Strain: Not on file  Food Insecurity: Not on file  Transportation Needs: Not on file  Physical Activity: Not on file  Stress: Not on file  Social Connections: Not on file     Family History: The patient's family history includes Colon cancer in his maternal grandfather; Colon cancer (age of onset: 17) in his father; Heart attack in his father; Hypertension in his mother; Stroke in his brother. There is no history of Kidney disease, Prostate cancer, Kidney cancer, or Bladder Cancer.  ROS:   Please see the history of present illness.     All other systems reviewed and are negative.  EKGs/Labs/Other Studies Reviewed:    The following studies were reviewed today:   Echo 08/2021  1. Left ventricular ejection fraction, by estimation, is 55 to 60%.  The  left ventricle has normal function. The left ventricle has no regional  wall motion abnormalities. Left ventricular diastolic parameters are  consistent with Grade II diastolic  dysfunction (pseudonormalization).   2. Right ventricular systolic function is normal. The right ventricular  size is normal.   3. The mitral valve is normal in structure. Trivial mitral valve  regurgitation. No evidence of mitral stenosis.   4. The aortic valve is tricuspid. Aortic valve regurgitation is not  visualized. No aortic stenosis is present.   5. The inferior vena cava is normal in size with greater than 50%  respiratory variability, suggesting right atrial pressure of 3 mmHg.    Cardiac cath 08/2021   Mid LM to Dist LM lesion is 30% stenosed.   Mid LAD lesion is 99% stenosed.   Ost LAD lesion is 30% stenosed.   Prox RCA lesion is 30% stenosed.   Prox RCA to Mid RCA lesion is 40% stenosed.   RPDA lesion is 30% stenosed.   A drug-eluting stent was successfully placed using a STENT ONYX FRONTIER 3.0X22.   Post intervention, there is a 0% residual stenosis.   1.  Severe one-vessel coronary artery disease with 99% thrombotic stenosis in the mid LAD which is the culprit for anterior STEMI.  Mild to moderate left main and RCA disease. 2.  Left ventricular angiography was not performed due to chronic kidney disease.  LVEDP was significantly elevated at 32 mmHg. 3.  Successful angioplasty and drug-eluting stent placement to the mid LAD.   Recommendations: Dual antiplatelet therapy for at least 12 months. Aggressive treatment of risk factors..  I switched atorvastatin to high-dose rosuvastatin. I switched metoprolol to carvedilol.  An ACE inhibitor or ARB can be resumed once renal function is stable. I requested an echocardiogram. Gentle hydration for renal protection.   Coronary Diagrams   Diagnostic Dominance: Right Intervention          EKG:  EKG is ordered today.  The ekg ordered today  demonstrates NSR, 65bpm, TWI V4-V6  Recent Labs: 09/24/2022: Hemoglobin 16.4  Recent Lipid Panel    Component Value Date/Time   CHOL 182 03/29/2022 1200   TRIG 265 (H) 03/29/2022 1200   HDL 29 (L) 03/29/2022 1200   CHOLHDL 6.3 (H) 03/29/2022 1200   CHOLHDL 5.1 08/27/2021 1243   VLDL 33 08/27/2021 1243   LDLCALC 107 (H) 03/29/2022 1200    Physical Exam:    VS:  BP (!) 158/90 (BP Location: Left Arm, Patient Position: Sitting, Cuff Size: Normal)   Pulse 65   Ht '5\' 10"'$  (1.778 m)   Wt 247 lb 8 oz (112.3 kg)   SpO2 98%   BMI 35.51 kg/m     Wt Readings from Last 3 Encounters:  11/29/22 247 lb 8 oz (112.3 kg)  10/01/22 238 lb (108 kg)  03/29/22 242 lb (109.8 kg)     GEN:  Well nourished, well developed in no acute distress HEENT: Normal NECK: No JVD; No carotid bruits LYMPHATICS: No lymphadenopathy CARDIAC: RRR, no murmurs, rubs, gallops RESPIRATORY:  Clear to auscultation without rales, wheezing or rhonchi  ABDOMEN: Soft, non-tender, non-distended MUSCULOSKELETAL:  No edema; No deformity  SKIN: Warm and dry NEUROLOGIC:  Alert and oriented x 3 PSYCHIATRIC:  Normal affect   ASSESSMENT:    1. Coronary artery disease involving native coronary artery of native heart with angina pectoris ([place])   2. Hyperlipidemia, mixed   3. Primary hypertension   4. History of renal transplant    PLAN:    In order of problems listed above:  CAD with STEMI s/p DES to mLAD 08/2021 Patient denies anginal symptoms. He reports he stays active at home. He has completed a year of DAPT with ASA and Effient. Ok to stop Effient, continue Aspirin. Continue BB and statin therapy. No further ischemic work-up at this time.  HLD LDL still above goal at 107, TG 265 and HDL 29. Patient reports compliance with Crestor. I will refer to the lipid clinic to consider injectable cholesterol medications  HTN BP elevated today. I will increase Telmisartan to '40mg'$  daily. He  will continue to check BP at home.    H/o renal transplant He follows closely with nephrology.   Disposition: Follow up in 6 month(s) with MD/APP     Signed, Trinda Harlacher Ninfa Meeker, PA-C  11/29/2022 10:34 AM    [place]

## 2022-12-03 DIAGNOSIS — J101 Influenza due to other identified influenza virus with other respiratory manifestations: Secondary | ICD-10-CM | POA: Diagnosis not present

## 2022-12-03 DIAGNOSIS — R509 Fever, unspecified: Secondary | ICD-10-CM | POA: Diagnosis not present

## 2022-12-03 DIAGNOSIS — Z20822 Contact with and (suspected) exposure to covid-19: Secondary | ICD-10-CM | POA: Diagnosis not present

## 2023-01-08 ENCOUNTER — Other Ambulatory Visit: Payer: Self-pay | Admitting: Urology

## 2023-01-08 DIAGNOSIS — F524 Premature ejaculation: Secondary | ICD-10-CM

## 2023-04-04 ENCOUNTER — Other Ambulatory Visit: Payer: BC Managed Care – PPO

## 2023-04-04 DIAGNOSIS — N138 Other obstructive and reflux uropathy: Secondary | ICD-10-CM

## 2023-04-04 DIAGNOSIS — N401 Enlarged prostate with lower urinary tract symptoms: Secondary | ICD-10-CM | POA: Diagnosis not present

## 2023-04-04 DIAGNOSIS — E349 Endocrine disorder, unspecified: Secondary | ICD-10-CM

## 2023-04-05 LAB — TESTOSTERONE: Testosterone: 313 ng/dL (ref 264–916)

## 2023-04-05 LAB — PSA: Prostate Specific Ag, Serum: 1.1 ng/mL (ref 0.0–4.0)

## 2023-04-05 LAB — HEMOGLOBIN AND HEMATOCRIT, BLOOD
Hematocrit: 46 % (ref 37.5–51.0)
Hemoglobin: 16.1 g/dL (ref 13.0–17.7)

## 2023-04-07 ENCOUNTER — Other Ambulatory Visit: Payer: Self-pay | Admitting: Urology

## 2023-04-07 ENCOUNTER — Telehealth: Payer: Self-pay | Admitting: Family Medicine

## 2023-04-07 DIAGNOSIS — E291 Testicular hypofunction: Secondary | ICD-10-CM

## 2023-04-07 MED ORDER — TESTOSTERONE CYPIONATE 200 MG/ML IM SOLN: INTRAMUSCULAR | 0 refills | Status: DC

## 2023-04-07 NOTE — Telephone Encounter (Signed)
-----   Message from Harle Battiest, PA-C sent at 04/06/2023  9:31 PM EDT ----- Please let [name] know that his blood work looks good.  I need to see him in 6 months for I PSS, SIHM and exam with blood work prior (testosterone level -midway between injection, PSA, hemacrit and hemoglobin)

## 2023-04-07 NOTE — Telephone Encounter (Signed)
Patient notified and appointments have been made. 

## 2023-04-11 ENCOUNTER — Other Ambulatory Visit: Payer: Self-pay | Admitting: Urology

## 2023-04-16 DIAGNOSIS — I10 Essential (primary) hypertension: Secondary | ICD-10-CM | POA: Diagnosis not present

## 2023-04-16 DIAGNOSIS — Z940 Kidney transplant status: Secondary | ICD-10-CM | POA: Diagnosis not present

## 2023-04-16 DIAGNOSIS — F419 Anxiety disorder, unspecified: Secondary | ICD-10-CM | POA: Diagnosis not present

## 2023-05-22 ENCOUNTER — Other Ambulatory Visit: Payer: Self-pay | Admitting: Urology

## 2023-05-22 ENCOUNTER — Other Ambulatory Visit: Payer: Self-pay | Admitting: Medical

## 2023-05-22 DIAGNOSIS — F524 Premature ejaculation: Secondary | ICD-10-CM

## 2023-07-07 DIAGNOSIS — M5442 Lumbago with sciatica, left side: Secondary | ICD-10-CM | POA: Diagnosis not present

## 2023-09-26 ENCOUNTER — Other Ambulatory Visit: Payer: BC Managed Care – PPO

## 2023-09-26 ENCOUNTER — Other Ambulatory Visit: Payer: Self-pay | Admitting: *Deleted

## 2023-09-26 DIAGNOSIS — E291 Testicular hypofunction: Secondary | ICD-10-CM

## 2023-09-26 DIAGNOSIS — N401 Enlarged prostate with lower urinary tract symptoms: Secondary | ICD-10-CM

## 2023-09-26 DIAGNOSIS — E349 Endocrine disorder, unspecified: Secondary | ICD-10-CM

## 2023-09-27 LAB — PSA: Prostate Specific Ag, Serum: 1.1 ng/mL (ref 0.0–4.0)

## 2023-09-27 LAB — HEMOGLOBIN AND HEMATOCRIT, BLOOD
Hematocrit: 46.9 % (ref 37.5–51.0)
Hemoglobin: 15.3 g/dL (ref 13.0–17.7)

## 2023-09-27 LAB — TESTOSTERONE: Testosterone: 412 ng/dL (ref 264–916)

## 2023-10-06 NOTE — Progress Notes (Unsigned)
3:35 PM   Chad Mccann 1966/07/24 [id number]  Referring provider: Corky Downs, MD [address] [place],  [place] [address]  Urological history: 1. 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 Testosterone deficiency -Contributing factors of age, obesity and sleep apnea -testosterone (09/2023) 412 -hemoglobin and HCT (09/2023) 15.3/46.9 -managed with testosterone cypionate 200 cc/mg, 0.75 cc every 14 days   3. ED -contributing factors of hyperlipidemia, HTN, BPH, testosterone deficiency and age -managed with tadalafil 20 mg, on-demand-dosing  4. BPH with LU TS -PSA (09/2023) 1.1  5. Sleep apnea -Risk factors for nocturia: obstructive sleep apnea, hypertension, diabetes, heart disease, anxiety and BPH.    -sleeps with CPAP   6. Premature ejaculation  CC:  6 months follow up   HPI: Chad Mccann is a 57 y.o. male who presents today for 6 months follow up.       Previous records reviewed.   SHIM 16  Still having issues with premature ejaculation.  Patient still having spontaneous erections.  He denies any pain or curvature with erections.   Still taking Viagra.    SHIM     Row Name 10/07/23 1515         SHIM: Over the last 6 months:   How do you rate your confidence that you could get and keep an erection? Low     When you had erections with sexual stimulation, how often were your erections hard enough for penetration (entering your partner)? Sometimes (about half the time)     During sexual intercourse, how often were you able to maintain your erection after you had penetrated (entered) your partner? Sometimes (about half the time)     During sexual intercourse, how difficult was it to maintain your erection to completion of intercourse? Slightly Difficult     When you attempted sexual intercourse, how often was it satisfactory for you?  Most Times (much more than half the time)       SHIM Total Score   SHIM 16             Score: 1-7 Severe ED 8-11 Moderate ED 12-16 Mild-Moderate ED 17-21 Mild ED 22-25 No ED  I PSS 11/2  No urinary complaints.  Patient denies any modifying or aggravating factors.  Patient denies any recent UTI's, gross hematuria, dysuria or suprapubic/flank pain.  Patient denies any fevers, chills, nausea or vomiting.     IPSS     Row Name 10/07/23 1500         International Prostate Symptom Score   How often have you had the sensation of not emptying your bladder? Not at All     How often have you had to urinate less than every two hours? Less than 1 in 5 times     How often have you found you stopped and started again several times when you urinated? About half the time     How often have you found it difficult to postpone urination? About half the time     How often have you had a weak urinary stream? Less than 1 in 5 times     How often have you had to strain to start urination? Less than 1 in 5 times     How many times did you typically get up at night to urinate? 2 Times     Total  IPSS Score 11       Quality of Life due to urinary symptoms   If you were to spend the rest of your life with your urinary condition just the way it is now how would you feel about that? Mostly Satisfied                Score:  1-7 Mild 8-19 Moderate 20-35 Severe   PMH: Past Medical History:  Diagnosis Date   Benign prostatic hypertrophy    Bladder spasm    Erectile dysfunction    Frequency    GERD (gastroesophageal reflux disease)    Hypertension    Hypogonadism in male    Kidney 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    Kidney, malrotation    MGUS (monoclonal gammopathy of unknown significance)    Monoclonal paraproteinemia    Obesity, unspecified    Other specified congenital anomaly of kidney    Other specified disorders of bladder    Other testicular hypofunction    Premature ejaculation     Sleep apnea    Ventral hernia     Surgical History: Past Surgical History:  Procedure Laterality Date   BACK SURGERY     c5/6 fusion after discectomy 3 weeks prior   COLONOSCOPY     polyp removal   CORONARY/GRAFT ACUTE MI REVASCULARIZATION N/A 08/27/2021   Procedure: Coronary/Graft Acute MI Revascularization;  Surgeon: Iran Ouch, MD;  Location: [facility];  Service: Cardiovascular;  Laterality: N/A;   dialysis port placement     and removal   HERNIA REPAIR  12/29/13   ventral   KIDNEY TRANSPLANT  01/2012   LEFT HEART CATH AND CORONARY ANGIOGRAPHY N/A 08/27/2021   Procedure: LEFT HEART CATH AND CORONARY ANGIOGRAPHY;  Surgeon: Iran Ouch, MD;  Location: [facility];  Service: Cardiovascular;  Laterality: N/A;   SPINE SURGERY     cervical fusion   TOTAL HIP ARTHROPLASTY Left 03/02/2019   Procedure: TOTAL HIP ARTHROPLASTY ANTERIOR APPROACH-LEFT;  Surgeon: Kennedy Bucker, MD;  Location: [facility];  Service: Orthopedics;  Laterality: Left;    Home Medications:  Allergies as of 10/07/2023       Reactions   Hydralazine Other (See Comments)   Nose bleeds/light headed   Morphine And Codeine Other (See Comments)   "difficulty waking patient"   Prednisone    hallucinations   Shellfish Allergy Nausea And Vomiting        Medication List        Accurate as of October 07, 2023  3:35 PM. If you have any questions, ask your nurse or doctor.          acetaminophen 325 MG tablet Commonly known as: TYLENOL Take 1-2 tablets (325-650 mg total) by mouth every 6 (six) hours as needed for mild pain (pain score 1-3 or temp > 100.5).   aspirin 81 MG chewable tablet Chew 1 tablet (81 mg total) by mouth daily.   carvedilol 12.5 MG tablet Commonly known as: COREG Take 1 tablet (12.5 mg total) by mouth 2 (two) times daily with a meal.   clonazePAM 0.5 MG tablet Commonly known as: KLONOPIN Take 0.5 mg by mouth at bedtime.   famotidine 20 MG tablet Commonly  known as: PEPCID Take 20 mg by mouth at bedtime.   mycophenolate 250 MG capsule Commonly known as: CELLCEPT Take 750 mg by mouth 2 (two) times daily.   nitroGLYCERIN 0.4 MG SL tablet Commonly known as: NITROSTAT Place 1 tablet (0.4 mg total)  under the tongue every 5 (five) minutes as needed for chest pain.   PARoxetine 20 MG tablet Commonly known as: Paxil Take 1 tablet (20 mg total) by mouth daily. What changed:  medication strength how much to take Changed by: Ariea Rochin   rosuvastatin 40 MG tablet Commonly known as: CRESTOR TAKE 1 TABLET BY MOUTH DAILY   sildenafil 100 MG tablet Commonly known as: VIAGRA TAKE 1 TABLET BY MOUTH 30 MINUTES TO 2 HOURS PRIOR TO INTERCOURSE ON AN EMPTY STOMACH. DO NOT USE WITHIN 24 HOURS OF NITROGLYCERIN TABLETS.   tacrolimus 1 MG capsule Commonly known as: PROGRAF Take 2 mg by mouth 2 (two) times daily.   telmisartan 40 MG tablet Commonly known as: MICARDIS Take 1 tablet (40 mg total) by mouth daily.   testosterone cypionate 200 MG/ML injection Commonly known as: DEPOTESTOSTERONE CYPIONATE Inject 0.75  mL into the muscle every 14 days        Allergies:  Allergies  Allergen Reactions   Hydralazine Other (See Comments)    Nose bleeds/light headed   Morphine And Codeine Other (See Comments)    "difficulty waking patient"   Prednisone     hallucinations   Shellfish Allergy Nausea And Vomiting    Family History: Family History  Problem Relation Age of Onset   Hypertension Mother    Colon cancer Father 2   Heart attack Father    Stroke Brother    Colon cancer Maternal Grandfather    Kidney disease Neg Hx    Prostate cancer Neg Hx    Kidney cancer Neg Hx    Bladder Cancer Neg Hx     Social History:  reports that he quit smoking about 2 years ago. His smoking use included cigars. He has been exposed to tobacco smoke. He has never used smokeless tobacco. He reports current alcohol use. He reports that he does not use  drugs.  ROS: For pertinent review of systems please refer to history of present illness  Physical Exam: BP (!) 159/78   Pulse 68   Ht 5\' 10"  (1.778 m)   Wt 260 lb (117.9 kg)   BMI 37.31 kg/m   Constitutional:  Well nourished. Alert and oriented, No acute distress. HEENT:  AT, moist mucus membranes.  Trachea midline Cardiovascular: No clubbing, cyanosis, or edema. Respiratory: Normal respiratory effort, no increased work of breathing. GU: No CVA tenderness.  No bladder fullness or masses.  Patient with uncircumcised phallus.  Foreskin easily retracted  Urethral meatus is patent.  No penile discharge. No penile lesions or rashes. Scrotum without lesions, cysts, rashes and/or edema.  Testicles are located scrotally bilaterally. No masses are appreciated in the testicles. Left and right epididymis are normal. Rectal: Patient with  normal sphincter tone. Anus and perineum without scarring or rashes. No rectal masses are appreciated. Prostate is approximately  50 grams, no nodules are appreciated. Seminal vesicles could not be palpated  Neurologic: Grossly intact, no focal deficits, moving all 4 extremities. Psychiatric: Normal mood and affect.   Laboratory Data: Results for orders placed or performed in visit on 09/26/23  Testosterone  Result Value Ref Range   Testosterone 412 264 - 916 ng/dL  PSA  Result Value Ref Range   Prostate Specific Ag, Serum 1.1 0.0 - 4.0 ng/mL  Hemoglobin and hematocrit, blood  Result Value Ref Range   Hemoglobin 15.3 13.0 - 17.7 g/dL   Hematocrit 51.8 84.1 - 51.0 %   Urinalysis  Urinalysis with Microscopy Order: [id number] Component Ref  Range & Units 5 mo ago  Color, UA Yellow  Clarity, UA Clear  Specific Gravity, UA 1.005 - 1.030 1.020  pH, UA 5.0 - 9.0 6.0  Leukocyte Esterase, UA Negative Negative  Nitrite, UA Negative Negative  Protein, UA Negative Negative  Glucose, UA Negative Negative  Ketones, UA Negative Negative  Urobilinogen,  UA 0.2 - 2.0 mg/dL 0.2 mg/dL  Bilirubin, UA Negative Negative  Blood, UA Negative Negative  RBC, UA <3 /HPF 1  WBC, UA <2 /HPF <1  Squam Epithel, UA 0 - 5 /HPF 1  Bacteria, UA None Seen /HPF Occasional Abnormal   Resulting Agency [facility]  Narrative Performed by [facility] manual microscopic performed  Specimen Collected: 04/16/23 08:55   Performed by: [facility] Last Resulted: 04/16/23 10:43  Received From: [facility]  Result Received: 05/22/23 12:24  I have reviewed the labs.  Pertinent Imaging N/A   Assessment & Plan:    1. Renal transplant -Followed by nephrology - last seen in 03/2023  2. Testosterone deficiency   -testosterone level is therapeutic -Hemoglobin/hematocrit are within normal levels -continue 0.75 cc every 14 days -refill sent to pharmacy  3. BPH with LU TS -continue conservative management  4. Erectile dysfunction:    -continue sildenafil 100 mg, on-demand-dosing   5.  Premature ejaculation -Failed lidocaine cream -Prescription sent for Paxil 20 mg nightly  Return in about 6 months (around 04/06/2024) for PSA, testosterone (one week after injection) H & H.  These notes generated with voice recognition software. I apologize for typographical errors.  Cloretta Ned  [facility] [address] [place], [place] [address] [phone]

## 2023-10-07 ENCOUNTER — Ambulatory Visit (INDEPENDENT_AMBULATORY_CARE_PROVIDER_SITE_OTHER): Payer: BC Managed Care – PPO | Admitting: Urology

## 2023-10-07 ENCOUNTER — Encounter: Payer: Self-pay | Admitting: Urology

## 2023-10-07 VITALS — BP 159/78 | HR 68 | Ht 70.0 in | Wt 260.0 lb

## 2023-10-07 DIAGNOSIS — N529 Male erectile dysfunction, unspecified: Secondary | ICD-10-CM

## 2023-10-07 DIAGNOSIS — E291 Testicular hypofunction: Secondary | ICD-10-CM

## 2023-10-07 DIAGNOSIS — F524 Premature ejaculation: Secondary | ICD-10-CM | POA: Diagnosis not present

## 2023-10-07 DIAGNOSIS — N401 Enlarged prostate with lower urinary tract symptoms: Secondary | ICD-10-CM | POA: Diagnosis not present

## 2023-10-07 DIAGNOSIS — N138 Other obstructive and reflux uropathy: Secondary | ICD-10-CM

## 2023-10-07 DIAGNOSIS — Z940 Kidney transplant status: Secondary | ICD-10-CM

## 2023-10-07 MED ORDER — TESTOSTERONE CYPIONATE 200 MG/ML IM SOLN
INTRAMUSCULAR | 0 refills | Status: DC
Start: 2023-10-07 — End: 2024-07-14

## 2023-10-07 MED ORDER — SILDENAFIL CITRATE 100 MG PO TABS
ORAL_TABLET | ORAL | 2 refills | Status: AC
Start: 2023-10-07 — End: ?

## 2023-10-07 MED ORDER — PAROXETINE HCL 20 MG PO TABS
20.0000 mg | ORAL_TABLET | Freq: Every day | ORAL | 3 refills | Status: AC
Start: 2023-10-07 — End: ?

## 2023-12-22 ENCOUNTER — Ambulatory Visit: Payer: BC Managed Care – PPO | Attending: Medical | Admitting: Medical

## 2023-12-22 NOTE — Progress Notes (Deleted)
Cardiology Office Note:    Date:  12/22/2023   ID:  Chad Mccann, DOB 11/22/1966, MRN [id number]  PCP:  Corky Downs, MD  [facility] Cardiologist:  Lorine Bears, MD  [facility] Electrophysiologist:  None   Referring MD: Corky Downs, MD   Chief Complaint: 1 year follow-up  History of Present Illness:    Chad Mccann is a 57 y.o. male with a hx of FSGS s/p renal transplant, STEMI s/p DES to mLAD 08/2021, HTN, HLD who presents for hospital follow-up.    Patient was admitted in 08/2021 for anterior STEMI s/p DES to mLAD. Echo showed LVEF 55-60%, G2DD, trivial MR. Insurance would not cover Brilinta. He was transitioned to Effient. He was started on Aspirin, coreg, Crestor.    The patient was last seen 11/29/22 and was overall doing well. He needed refill of cardiac meds. He was awaiting to see pulmonology for OSA.  Today   Past Medical History:  Diagnosis Date   Benign prostatic hypertrophy    Bladder spasm    Erectile dysfunction    Frequency    GERD (gastroesophageal reflux disease)    Hypertension    Hypogonadism in male    Kidney 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    Kidney, malrotation    MGUS (monoclonal gammopathy of unknown significance)    Monoclonal paraproteinemia    Obesity, unspecified    Other specified congenital anomaly of kidney    Other specified disorders of bladder    Other testicular hypofunction    Premature ejaculation    Sleep apnea    Ventral hernia     Past Surgical History:  Procedure Laterality Date   BACK SURGERY     c5/6 fusion after discectomy 3 weeks prior   COLONOSCOPY     polyp removal   CORONARY/GRAFT ACUTE MI REVASCULARIZATION N/A 08/27/2021   Procedure: Coronary/Graft Acute MI Revascularization;  Surgeon: Iran Ouch, MD;  Location: [facility] INVASIVE CV LAB;  Service: Cardiovascular;  Laterality: N/A;   dialysis port placement     and removal   HERNIA REPAIR  12/29/13   ventral   KIDNEY TRANSPLANT  01/2012   LEFT HEART  CATH AND CORONARY ANGIOGRAPHY N/A 08/27/2021   Procedure: LEFT HEART CATH AND CORONARY ANGIOGRAPHY;  Surgeon: Iran Ouch, MD;  Location: [facility] INVASIVE CV LAB;  Service: Cardiovascular;  Laterality: N/A;   SPINE SURGERY     cervical fusion   TOTAL HIP ARTHROPLASTY Left 03/02/2019   Procedure: TOTAL HIP ARTHROPLASTY ANTERIOR APPROACH-LEFT;  Surgeon: Kennedy Bucker, MD;  Location: [facility] ORS;  Service: Orthopedics;  Laterality: Left;    Current Medications: No outpatient medications have been marked as taking for the 12/22/23 encounter (Appointment) with Fransico Michael, Angelize Ryce H, PA-C.     Allergies:   Hydralazine, Morphine and codeine, Prednisone, and Shellfish allergy   Social History   Socioeconomic History   Marital status: Married    Spouse name: Not on file   Number of children: Not on file   Years of education: Not on file   Highest education level: Not on file  Occupational History   Occupation: Academic librarian  Tobacco Use   Smoking status: Former    Types: Cigars    Quit date: 08/27/2021    Years since quitting: 2.3    Passive exposure: Past   Smokeless tobacco: Never  Vaping Use   Vaping status: Never Used  Substance and Sexual Activity   Alcohol use: Yes    Alcohol/week: 0.0 standard drinks  of alcohol    Comment: occ   Drug use: No   Sexual activity: Yes  Other Topics Concern   Not on file  Social History Narrative   Not on file   Social Drivers of Health   Financial Resource Strain: Not on file  Food Insecurity: Not on file  Transportation Needs: Not on file  Physical Activity: Not on file  Stress: Not on file  Social Connections: Not on file     Family History: The patient's ***family history includes Colon cancer in his maternal grandfather; Colon cancer (age of onset: 53) in his father; Heart attack in his father; Hypertension in his mother; Stroke in his brother. There is no history of Kidney disease, Prostate cancer, Kidney cancer, or Bladder  Cancer.  ROS:   Please see the history of present illness.    *** All other systems reviewed and are negative.  EKGs/Labs/Other Studies Reviewed:    The following studies were reviewed today: ***  EKG:  EKG is *** ordered today.  The ekg ordered today demonstrates ***  Recent Labs: 09/26/2023: Hemoglobin 15.3  Recent Lipid Panel    Component Value Date/Time   CHOL 182 03/29/2022 1200   TRIG 265 (H) 03/29/2022 1200   HDL 29 (L) 03/29/2022 1200   CHOLHDL 6.3 (H) 03/29/2022 1200   CHOLHDL 5.1 08/27/2021 1243   VLDL 33 08/27/2021 1243   LDLCALC 107 (H) 03/29/2022 1200     Risk Assessment/Calculations:   {Does this patient have ATRIAL FIBRILLATION?[phone]   Physical Exam:    VS:  There were no vitals taken for this visit.    Wt Readings from Last 3 Encounters:  10/07/23 260 lb (117.9 kg)  11/29/22 247 lb 8 oz (112.3 kg)  10/01/22 238 lb (108 kg)     GEN: *** Well nourished, well developed in no acute distress HEENT: Normal NECK: No JVD; No carotid bruits LYMPHATICS: No lymphadenopathy CARDIAC: ***RRR, no murmurs, rubs, gallops RESPIRATORY:  Clear to auscultation without rales, wheezing or rhonchi  ABDOMEN: Soft, non-tender, non-distended MUSCULOSKELETAL:  No edema; No deformity  SKIN: Warm and dry NEUROLOGIC:  Alert and oriented x 3 PSYCHIATRIC:  Normal affect   ASSESSMENT:    No diagnosis found. PLAN:    In order of problems listed above:  ***  Disposition: Follow up {follow up:15908} with ***   Shared Decision Making/Informed Consent   {Are you ordering a CV Procedure (e.g. stress test, cath, DCCV, TEE, etc)?   Press F2        :[id number]}    Signed, Anurag Scarfo David Stall, PA-C  12/22/2023 10:18 AM    [facility]

## 2023-12-23 ENCOUNTER — Encounter: Payer: Self-pay | Admitting: Medical

## 2024-01-15 ENCOUNTER — Other Ambulatory Visit: Payer: Self-pay | Admitting: Cardiovascular Disease

## 2024-01-19 NOTE — Telephone Encounter (Signed)
Hi,  Patient is seeing Cadence tomorrow, LOV 11/2022. Will you please address this refill request at time of visit?  Thanks,

## 2024-01-20 ENCOUNTER — Encounter: Payer: Self-pay | Admitting: Medical

## 2024-01-20 ENCOUNTER — Ambulatory Visit: Payer: BC Managed Care – PPO | Attending: Medical | Admitting: Medical

## 2024-01-20 VITALS — BP 130/77 | HR 77 | Ht 70.0 in | Wt 287.0 lb

## 2024-01-20 DIAGNOSIS — Z940 Kidney transplant status: Secondary | ICD-10-CM

## 2024-01-20 DIAGNOSIS — I25119 Atherosclerotic heart disease of native coronary artery with unspecified angina pectoris: Secondary | ICD-10-CM | POA: Diagnosis not present

## 2024-01-20 DIAGNOSIS — E782 Mixed hyperlipidemia: Secondary | ICD-10-CM | POA: Diagnosis not present

## 2024-01-20 DIAGNOSIS — I10 Essential (primary) hypertension: Secondary | ICD-10-CM | POA: Diagnosis not present

## 2024-01-20 NOTE — Progress Notes (Signed)
Cardiology Office Note:    Date:  01/20/2024   ID:  Chad Mccann, DOB 07-Jan-1966, MRN [id number]  PCP:  Corky Downs, MD  [facility] Cardiologist:  Lorine Bears, MD  [facility] Electrophysiologist:  None   Referring MD: Corky Downs, MD   Chief Complaint: 1 year follow-up  History of Present Illness:    Chad Mccann is a 58 y.o. male with a hx of  FSGS s/p renal transplant, CAD with STEMI s/p DES to mLAD 08/2021, HTN, HLD who presents for follow-up.    Patient was admitted 08/2021 for anterior STEMI s/p DES to mLAD. Echo showed LVEF 55-60%, G2DD, trivial MR. Insurance would not cover Brilinta. He was transitioned to Effient. He was started on Aspirin, coreg, Crestor.   Patient was last seen in December 2023 and was doing well from a cardiac perspective.  Today, the patient reports he lost around 60lbs. He kept it off for 6 months and then then the weight came back rapidly, and he is unsure why. He is wondering about weight loss medication. He denies chest pain or shortness of breath. He is not doing any formal activity. He is on 20mg  telmisartan.   Past Medical History:  Diagnosis Date   Benign prostatic hypertrophy    Bladder spasm    Erectile dysfunction    Frequency    GERD (gastroesophageal reflux disease)    Hypertension    Hypogonadism in male    Kidney 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    Kidney, malrotation    MGUS (monoclonal gammopathy of unknown significance)    Monoclonal paraproteinemia    Obesity, unspecified    Other specified congenital anomaly of kidney    Other specified disorders of bladder    Other testicular hypofunction    Premature ejaculation    Sleep apnea    Ventral hernia     Past Surgical History:  Procedure Laterality Date   BACK SURGERY     c5/6 fusion after discectomy 3 weeks prior   COLONOSCOPY     polyp removal   CORONARY/GRAFT ACUTE MI REVASCULARIZATION N/A 08/27/2021   Procedure: Coronary/Graft Acute MI Revascularization;   Surgeon: Iran Ouch, MD;  Location: [facility] INVASIVE CV LAB;  Service: Cardiovascular;  Laterality: N/A;   dialysis port placement     and removal   HERNIA REPAIR  12/29/13   ventral   KIDNEY TRANSPLANT  01/2012   LEFT HEART CATH AND CORONARY ANGIOGRAPHY N/A 08/27/2021   Procedure: LEFT HEART CATH AND CORONARY ANGIOGRAPHY;  Surgeon: Iran Ouch, MD;  Location: [facility] INVASIVE CV LAB;  Service: Cardiovascular;  Laterality: N/A;   SPINE SURGERY     cervical fusion   TOTAL HIP ARTHROPLASTY Left 03/02/2019   Procedure: TOTAL HIP ARTHROPLASTY ANTERIOR APPROACH-LEFT;  Surgeon: Kennedy Bucker, MD;  Location: [facility] ORS;  Service: Orthopedics;  Laterality: Left;    Current Medications: Current Meds  Medication Sig   acetaminophen (TYLENOL) 325 MG tablet Take 1-2 tablets (325-650 mg total) by mouth every 6 (six) hours as needed for mild pain (pain score 1-3 or temp > 100.5).   aspirin 81 MG chewable tablet Chew 1 tablet (81 mg total) by mouth daily.   carvedilol (COREG) 12.5 MG tablet Take 1 tablet (12.5 mg total) by mouth 2 (two) times daily with a meal.   clonazePAM (KLONOPIN) 0.5 MG tablet Take 0.5 mg by mouth at bedtime.   famotidine (PEPCID) 20 MG tablet Take 20 mg by mouth at bedtime.   mycophenolate (CELLCEPT)  250 MG capsule Take 750 mg by mouth 2 (two) times daily.   nitroGLYCERIN (NITROSTAT) 0.4 MG SL tablet Place 1 tablet (0.4 mg total) under the tongue every 5 (five) minutes as needed for chest pain.   PARoxetine (PAXIL) 20 MG tablet Take 1 tablet (20 mg total) by mouth daily.   rosuvastatin (CRESTOR) 40 MG tablet TAKE 1 TABLET BY MOUTH DAILY   sildenafil (VIAGRA) 100 MG tablet TAKE 1 TABLET BY MOUTH 30 MINUTES TO 2 HOURS PRIOR TO INTERCOURSE ON AN EMPTY STOMACH. DO NOT USE WITHIN 24 HOURS OF NITROGLYCERIN TABLETS.   tacrolimus (PROGRAF) 1 MG capsule Take 2 mg by mouth 2 (two) times daily.   telmisartan (MICARDIS) 20 MG tablet Take 1 tablet by mouth daily.   testosterone cypionate  (DEPOTESTOSTERONE CYPIONATE) 200 MG/ML injection Inject 0.75  mL into the muscle every 14 days     Allergies:   Hydralazine, Morphine and codeine, Prednisone, and Shellfish allergy   Social History   Socioeconomic History   Marital status: Married    Spouse name: Not on file   Number of children: Not on file   Years of education: Not on file   Highest education level: Not on file  Occupational History   Occupation: Academic librarian  Tobacco Use   Smoking status: Former    Types: Cigars    Quit date: 08/27/2021    Years since quitting: 2.4    Passive exposure: Past   Smokeless tobacco: Never  Vaping Use   Vaping status: Never Used  Substance and Sexual Activity   Alcohol use: Yes    Alcohol/week: 0.0 standard drinks of alcohol    Comment: occ   Drug use: No   Sexual activity: Yes  Other Topics Concern   Not on file  Social History Narrative   Not on file   Social Drivers of Health   Financial Resource Strain: Not on file  Food Insecurity: Not on file  Transportation Needs: Not on file  Physical Activity: Not on file  Stress: Not on file  Social Connections: Not on file     Family History: The patient's family history includes Colon cancer in his maternal grandfather; Colon cancer (age of onset: 39) in his father; Heart attack in his father; Hypertension in his mother; Stroke in his brother. There is no history of Kidney disease, Prostate cancer, Kidney cancer, or Bladder Cancer.  ROS:   Please see the history of present illness.     All other systems reviewed and are negative.  EKGs/Labs/Other Studies Reviewed:    The following studies were reviewed today:   Echo 08/2021  1. Left ventricular ejection fraction, by estimation, is 55 to 60%. The  left ventricle has normal function. The left ventricle has no regional  wall motion abnormalities. Left ventricular diastolic parameters are  consistent with Grade II diastolic  dysfunction (pseudonormalization).    2. Right ventricular systolic function is normal. The right ventricular  size is normal.   3. The mitral valve is normal in structure. Trivial mitral valve  regurgitation. No evidence of mitral stenosis.   4. The aortic valve is tricuspid. Aortic valve regurgitation is not  visualized. No aortic stenosis is present.   5. The inferior vena cava is normal in size with greater than 50%  respiratory variability, suggesting right atrial pressure of 3 mmHg.    Cardiac cath 08/2021   Mid LM to Dist LM lesion is 30% stenosed.   Mid LAD lesion is 99%  stenosed.   Ost LAD lesion is 30% stenosed.   Prox RCA lesion is 30% stenosed.   Prox RCA to Mid RCA lesion is 40% stenosed.   RPDA lesion is 30% stenosed.   A drug-eluting stent was successfully placed using a STENT ONYX FRONTIER 3.0X22.   Post intervention, there is a 0% residual stenosis.   1.  Severe one-vessel coronary artery disease with 99% thrombotic stenosis in the mid LAD which is the culprit for anterior STEMI.  Mild to moderate left main and RCA disease. 2.  Left ventricular angiography was not performed due to chronic kidney disease.  LVEDP was significantly elevated at 32 mmHg. 3.  Successful angioplasty and drug-eluting stent placement to the mid LAD.   Recommendations: Dual antiplatelet therapy for at least 12 months. Aggressive treatment of risk factors..  I switched atorvastatin to high-dose rosuvastatin. I switched metoprolol to carvedilol.  An ACE inhibitor or ARB can be resumed once renal function is stable. I requested an echocardiogram. Gentle hydration for renal protection.   Coronary Diagrams   Diagnostic Dominance: Right Intervention          EKG:  EKG is ordered today.  The ekg ordered today demonstrates NSR 77bpm, TWI inferolateral leads  Recent Labs: 09/26/2023: Hemoglobin 15.3  Recent Lipid Panel    Component Value Date/Time   CHOL 182 03/29/2022 1200   TRIG 265 (H) 03/29/2022 1200   HDL 29 (L)  03/29/2022 1200   CHOLHDL 6.3 (H) 03/29/2022 1200   CHOLHDL 5.1 08/27/2021 1243   VLDL 33 08/27/2021 1243   LDLCALC 107 (H) 03/29/2022 1200   Physical Exam:    VS:  BP 130/77 (BP Location: Left Arm, Patient Position: Sitting, Cuff Size: Normal)   Pulse 77   Ht 5\' 10"  (1.778 m)   Wt 287 lb (130.2 kg)   SpO2 95%   BMI 41.18 kg/m     Wt Readings from Last 3 Encounters:  01/20/24 287 lb (130.2 kg)  10/07/23 260 lb (117.9 kg)  11/29/22 247 lb 8 oz (112.3 kg)     GEN:  Well nourished, well developed in no acute distress HEENT: Normal NECK: No JVD; No carotid bruits LYMPHATICS: No lymphadenopathy CARDIAC: RRR, no murmurs, rubs, gallops RESPIRATORY:  Clear to auscultation without rales, wheezing or rhonchi  ABDOMEN: Soft, non-tender, non-distended MUSCULOSKELETAL:  No edema; No deformity  SKIN: Warm and dry NEUROLOGIC:  Alert and oriented x 3 PSYCHIATRIC:  Normal affect   ASSESSMENT:    1. Coronary artery disease involving native coronary artery of native heart with angina pectoris (HCC)   2. Hyperlipidemia, mixed   3. Essential hypertension   4. History of renal transplant    PLAN:    In order of problems listed above:  CAD with STEMI s/p DES to mLAD 08/2021 The patient denies anginal symptoms. He initially lost 60lbs, but then it came back rapidly after a year. He is wondering about weight loss drugs, which I said is Ok from out perspective. He will see his PCP regarding this. Continue ASA, Crestor, Coreg and Telmisartan. We discussed lifestyle changes.   HLD LDL 41. Continue Crestor 40mg  daily.   HTN BP is good, continue Micardis 20mg  daily and Coreg 23.5mg  BID.   H/o renal transplant He follows with nephrology.   Disposition: Follow up in 1 year(s) with MD    Signed, Edy Mcbane David Stall, PA-C  01/20/2024 4:23 PM    [facility]

## 2024-01-20 NOTE — Patient Instructions (Signed)
Medication Instructions:   Your physician recommends that you continue on your current medications as directed. Please refer to the Current Medication list given to you today.  *If you need a refill on your cardiac medications before your next appointment, please call your pharmacy*   Lab Work:  No labs ordered today   If you have labs (blood work) drawn today and your tests are completely normal, you will receive your results only by: MyChart Message (if you have MyChart) OR A paper copy in the mail If you have any lab test that is abnormal or we need to change your treatment, we will call you to review the results.   Testing/Procedures:  No test ordered today    Follow-Up: At [facility], you and your health needs are our priority.  As part of our continuing mission to provide you with exceptional heart care, we have created designated Provider Care Teams.  These Care Teams include your primary Cardiologist (physician) and Advanced Practice Providers (APPs -  Physician Assistants and Nurse Practitioners) who all work together to provide you with the care you need, when you need it.  1 year(s)  Provider:   Lorine Bears, MD

## 2024-02-03 DIAGNOSIS — J111 Influenza due to unidentified influenza virus with other respiratory manifestations: Secondary | ICD-10-CM | POA: Diagnosis not present

## 2024-02-03 DIAGNOSIS — Z20822 Contact with and (suspected) exposure to covid-19: Secondary | ICD-10-CM | POA: Diagnosis not present

## 2024-02-03 DIAGNOSIS — R509 Fever, unspecified: Secondary | ICD-10-CM | POA: Diagnosis not present

## 2024-02-12 ENCOUNTER — Other Ambulatory Visit: Payer: Self-pay | Admitting: Medical

## 2024-02-12 DIAGNOSIS — I10 Essential (primary) hypertension: Secondary | ICD-10-CM

## 2024-04-06 ENCOUNTER — Encounter: Payer: Self-pay | Admitting: Urology

## 2024-04-06 ENCOUNTER — Other Ambulatory Visit: Payer: Self-pay

## 2024-07-13 ENCOUNTER — Other Ambulatory Visit

## 2024-07-13 DIAGNOSIS — E291 Testicular hypofunction: Secondary | ICD-10-CM

## 2024-07-13 DIAGNOSIS — F524 Premature ejaculation: Secondary | ICD-10-CM

## 2024-07-13 DIAGNOSIS — N138 Other obstructive and reflux uropathy: Secondary | ICD-10-CM

## 2024-07-13 DIAGNOSIS — N529 Male erectile dysfunction, unspecified: Secondary | ICD-10-CM

## 2024-07-13 DIAGNOSIS — N401 Enlarged prostate with lower urinary tract symptoms: Secondary | ICD-10-CM | POA: Diagnosis not present

## 2024-07-14 ENCOUNTER — Ambulatory Visit: Payer: Self-pay | Admitting: Urology

## 2024-07-14 ENCOUNTER — Other Ambulatory Visit: Payer: Self-pay | Admitting: Urology

## 2024-07-14 DIAGNOSIS — E291 Testicular hypofunction: Secondary | ICD-10-CM

## 2024-07-14 LAB — PSA: Prostate Specific Ag, Serum: 1.4 ng/mL (ref 0.0–4.0)

## 2024-07-14 LAB — HEMOGLOBIN AND HEMATOCRIT, BLOOD
Hematocrit: 49.1 % (ref 37.5–51.0)
Hemoglobin: 16.3 g/dL (ref 13.0–17.7)

## 2024-07-14 LAB — TESTOSTERONE: Testosterone: 400 ng/dL (ref 264–916)

## 2024-07-14 MED ORDER — TESTOSTERONE CYPIONATE 200 MG/ML IM SOLN: INTRAMUSCULAR | 0 refills | Status: AC

## 2024-08-11 DIAGNOSIS — E870 Hyperosmolality and hypernatremia: Secondary | ICD-10-CM | POA: Diagnosis not present

## 2024-08-11 DIAGNOSIS — D849 Immunodeficiency, unspecified: Secondary | ICD-10-CM | POA: Diagnosis not present

## 2024-08-11 DIAGNOSIS — Z940 Kidney transplant status: Secondary | ICD-10-CM | POA: Diagnosis not present

## 2024-08-11 DIAGNOSIS — G4733 Obstructive sleep apnea (adult) (pediatric): Secondary | ICD-10-CM | POA: Diagnosis not present

## 2024-08-11 DIAGNOSIS — Z09 Encounter for follow-up examination after completed treatment for conditions other than malignant neoplasm: Secondary | ICD-10-CM | POA: Diagnosis not present

## 2024-08-26 DIAGNOSIS — Z940 Kidney transplant status: Secondary | ICD-10-CM | POA: Diagnosis not present

## 2024-10-04 ENCOUNTER — Other Ambulatory Visit: Payer: Self-pay

## 2024-10-05 NOTE — Progress Notes (Deleted)
 9:31 PM   BROUGHTON EPPINGER 09/28/1966 [id number]  Referring provider: Britta King, MD [address] [place],  [place] [address]  Urological history: 1. 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 Hypogonadism -Contributing factors of age, obesity and sleep apnea -testosterone  pending -hemoglobin and HCT (07/2024) 15.4/45.5 -managed with testosterone  cypionate 200 cc/mg, 0.75 cc every 14 days   3. ED -contributing factors of hyperlipidemia, HTN, BPH, testosterone  deficiency and age -managed with tadalafil  20 mg, on-demand-dosing  4. BPH with LU TS -PSA (06/2024) 1.4  5. Sleep apnea -Risk factors for nocturia: obstructive sleep apnea, hypertension, diabetes, heart disease, anxiety and BPH.    -sleeps with CPAP   6. Premature ejaculation  CC:  6 months follow up   HPI: Chad Mccann is a 58 y.o. man who presents today for 12 months follow up.       Previous records reviewed.   He reports good adherence to testosterone  cypionate 200 mg/mL, 0.75 cc every 14 days.  Denies new complaints of low libido, erectile dysfunction, fatigue, or mood changes.  No complaints of gynecomastia, visual changes, or thromboembolic symptoms.  Energy level, libido and overall sense of wellbeing being reported as stable/ improved compared to prior visit.    Testosterone  level pending  Hemoglobin/hematocrit (07/2024) 15.4/45.5  Liver enzymes WNL  I PSS ***  He reports sensation of incomplete bladder emptying, urinary frequency, urinary intermittency, urinary urgency, a weak urinary stream, having to strain to void, nocturia x ***, leaking before being able to reach the restroom, leaking with coughing, leaking without awareness, and post void dribbling.     He is wearing *** pads//depends  daily.    Patient denies any modifying or aggravating factors.  Patient  denies any recent UTI's, gross hematuria, dysuria or suprapubic/flank pain.  Patient denies any fevers, chills, nausea or vomiting.  ***  He has a family history of PCa, colon cancer, ovarian cancer and/or breast cancer with ***.   He does not have a family history of PCa, colon cancer, ovarian cancer, and/or breast cancer .***     UA (07/2024) bland  PVR***  PSA (06/2024) 1.4  Serum creatinine (07/2024) 1.45, eGFR 56  Hemoglobin A1c (07/2024) 5.7  Diuretics: chlorthalidone  Fluid consumptiom: ***  SHIM ***  He does not have confidence that he could get and keep an erection, his erections are not firm enough for penetrative intercourse, he has difficulty maintaining his erections,  and he is not finding intercourse satisfactory for him.  ***  Patient still having spontaneous erections.  ***   He denies any pain or curvature with erections.    He is not able to ejaculate, has pain with ejaculation, and has blood in his ejaculate fluid.   ***  Testosterone  level pending   Cholesterol (07/2024) 98  TSH ***   Tried and failed ***  PMH: Past Medical History:  Diagnosis Date   Benign prostatic hypertrophy    Bladder spasm    Erectile dysfunction    Frequency    GERD (gastroesophageal reflux disease)    Hypertension    Hypogonadism in male    Kidney 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    Kidney, malrotation    MGUS (monoclonal gammopathy of unknown significance)    Monoclonal paraproteinemia    Obesity, unspecified    Other specified congenital anomaly of kidney    Other  specified disorders of bladder    Other testicular hypofunction    Premature ejaculation    Sleep apnea    Ventral hernia     Surgical History: Past Surgical History:  Procedure Laterality Date   BACK SURGERY     c5/6 fusion after discectomy 3 weeks prior   COLONOSCOPY     polyp removal   CORONARY/GRAFT ACUTE MI REVASCULARIZATION N/A 08/27/2021   Procedure: Coronary/Graft Acute MI Revascularization;   Surgeon: Darron Deatrice LABOR, MD;  Location: [facility] INVASIVE [facility];  Service: Cardiovascular;  Laterality: N/A;   dialysis port placement     and removal   HERNIA REPAIR  12/29/13   ventral   KIDNEY TRANSPLANT  01/2012   LEFT HEART CATH AND CORONARY ANGIOGRAPHY N/A 08/27/2021   Procedure: LEFT HEART CATH AND CORONARY ANGIOGRAPHY;  Surgeon: Darron Deatrice LABOR, MD;  Location: [facility] INV[facility] CV [facility];  Service: Cardiovascular;  Laterality: N/A;   SPINE SURGERY     cervical fusion   TOTAL HIP ARTHROPLASTY Left 03/02/2019   Procedure: TOTAL HIP ARTHROPLASTY ANTERIOR APPROACH-LEFT;  Surgeon: Kathlynn Sharper, MD;  Location: [facility] ORS;  Service: Orthopedics;  Laterality: Left;    Home Medications:  Allergies as of 10/06/2024       Reactions   Hydralazine Other (See Comments)   Nose bleeds/light headed   Morphine  And Codeine Other (See Comments)   difficulty waking patient   Prednisone    hallucinations   Shellfish Allergy Nausea And Vomiting        Medication List        Accurate as of October 05, 2024  9:31 PM. If you have any questions, ask your nurse or doctor.          acetaminophen  325 MG tablet Commonly known as: TYLENOL  Take 1-2 tablets (325-650 mg total) by mouth every 6 (six) hours as needed for mild pain (pain score 1-3 or temp > 100.5).   aspirin  81 MG chewable tablet Chew 1 tablet (81 mg total) by mouth daily.   carvedilol  12.5 MG tablet Commonly known as: COREG  Take 1 tablet (12.5 mg total) by mouth 2 (two) times daily with a meal.   clonazePAM  0.5 MG tablet Commonly known as: KLONOPIN  Take 0.5 mg by mouth at bedtime.   famotidine  20 MG tablet Commonly known as: PEPCID  Take 20 mg by mouth at bedtime.   mycophenolate  250 MG capsule Commonly known as: CELLCEPT  Take 750 mg by mouth 2 (two) times daily.   nitroGLYCERIN  0.4 MG SL tablet Commonly known as: NITROSTAT  Place 1 tablet (0.4 mg total) under the tongue every 5 (five) minutes as needed for chest pain.    PARoxetine  20 MG tablet Commonly known as: Paxil  Take 1 tablet (20 mg total) by mouth daily.   rosuvastatin  40 MG tablet Commonly known as: CRESTOR  TAKE 1 TABLET BY MOUTH DAILY   sildenafil  100 MG tablet Commonly known as: VIAGRA  TAKE 1 TABLET BY MOUTH 30 MINUTES TO 2 HOURS PRIOR TO INTERCOURSE ON AN EMPTY STOMACH. DO NOT USE WITHIN 24 HOURS OF NITROGLYCERIN  TABLETS.   tacrolimus  1 MG capsule Commonly known as: PROGRAF  Take 2 mg by mouth 2 (two) times daily.   telmisartan  20 MG tablet Commonly known as: MICARDIS  Take 1 tablet by mouth daily.   testosterone  cypionate 200 MG/ML injection Commonly known as: DEPOTESTOSTERONE CYPIONATE Inject 0.75  mL into the muscle every 14 days        Allergies:  Allergies  Allergen Reactions   Hydralazine Other (See  Comments)    Nose bleeds/light headed   Morphine  And Codeine Other (See Comments)    difficulty waking patient   Prednisone     hallucinations   Shellfish Allergy Nausea And Vomiting    Family History: Family History  Problem Relation Age of Onset   Hypertension Mother    Colon cancer Father 66   Heart attack Father    Stroke Brother    Colon cancer Maternal Grandfather    Kidney disease Neg Hx    Prostate cancer Neg Hx    Kidney cancer Neg Hx    Bladder Cancer Neg Hx     Social History:  reports that he quit smoking about 3 years ago. His smoking use included cigars. He has been exposed to tobacco smoke. He has never used smokeless tobacco. He reports current alcohol use. He reports that he does not use drugs.  ROS: For pertinent review of systems please refer to history of present illness  Physical Exam: There were no vitals taken for this visit.  Constitutional:  Well nourished. Alert and oriented, No acute distress. HEENT: [place] AT, moist mucus membranes.  Trachea midline, no masses. Cardiovascular: No clubbing, cyanosis, or edema. Respiratory: Normal respiratory effort, no increased work of  breathing. GI: Abdomen is soft, non tender, non distended, no abdominal masses. Liver and spleen not palpable.  No hernias appreciated.  Stool sample for occult testing is not indicated.   GU: No CVA tenderness.  No bladder fullness or masses.  Patient with circumcised/uncircumcised phallus. ***Foreskin easily retracted***  Urethral meatus is patent.  No penile discharge. No penile lesions or rashes. Scrotum without lesions, cysts, rashes and/or edema.  Testicles are located scrotally bilaterally. No masses are appreciated in the testicles. Left and right epididymis are normal. Rectal: Patient with  normal sphincter tone. Anus and perineum without scarring or rashes. No rectal masses are appreciated. Prostate is approximately *** grams, *** nodules are appreciated. Seminal vesicles are normal. Skin: No rashes, bruises or suspicious lesions. Lymph: No cervical or inguinal adenopathy. Neurologic: Grossly intact, no focal deficits, moving all 4 extremities. Psychiatric: Normal mood and affect.   Laboratory Data: See Epic and HPI   I have reviewed the labs.  Pertinent Imaging N/A   Assessment & Plan:    1. Renal transplant -Followed by nephrology - last seen in 03/2023  2. Hypogonadism -testosterone  level pending  -Hemoglobin/hematocrit are within normal levels -continue 0.75 cc every 14 days -refill sent to pharmacy  3. BPH with LU TS -continue conservative management  4. Erectile dysfunction:    -continue sildenafil  100 mg, on-demand-dosing   5.  Premature ejaculation -Failed lidocaine  cream -Prescription sent for Paxil  20 mg nightly  No follow-ups on file.  These notes generated with voice recognition software. I apologize for typographical errors.  CLOTILDA HELON RIGGERS  [facility] [address] [place], [place] [address] [phone]

## 2024-10-06 ENCOUNTER — Ambulatory Visit: Payer: Self-pay | Admitting: Urology

## 2024-10-06 DIAGNOSIS — F524 Premature ejaculation: Secondary | ICD-10-CM

## 2024-10-06 DIAGNOSIS — E291 Testicular hypofunction: Secondary | ICD-10-CM

## 2024-10-06 DIAGNOSIS — N529 Male erectile dysfunction, unspecified: Secondary | ICD-10-CM

## 2024-10-06 DIAGNOSIS — N138 Other obstructive and reflux uropathy: Secondary | ICD-10-CM

## 2024-12-03 DIAGNOSIS — Z23 Encounter for immunization: Secondary | ICD-10-CM | POA: Diagnosis not present

## 2024-12-03 DIAGNOSIS — Z7985 Long-term (current) use of injectable non-insulin antidiabetic drugs: Secondary | ICD-10-CM | POA: Diagnosis not present

## 2024-12-03 DIAGNOSIS — I2510 Atherosclerotic heart disease of native coronary artery without angina pectoris: Secondary | ICD-10-CM | POA: Diagnosis not present

## 2024-12-03 DIAGNOSIS — D84821 Immunodeficiency due to drugs: Secondary | ICD-10-CM | POA: Diagnosis not present

## 2024-12-03 DIAGNOSIS — Z6838 Body mass index (BMI) 38.0-38.9, adult: Secondary | ICD-10-CM | POA: Diagnosis not present

## 2024-12-03 DIAGNOSIS — Z955 Presence of coronary angioplasty implant and graft: Secondary | ICD-10-CM | POA: Diagnosis not present

## 2024-12-03 DIAGNOSIS — E785 Hyperlipidemia, unspecified: Secondary | ICD-10-CM | POA: Diagnosis not present

## 2024-12-03 DIAGNOSIS — E669 Obesity, unspecified: Secondary | ICD-10-CM | POA: Diagnosis not present

## 2024-12-03 DIAGNOSIS — Z940 Kidney transplant status: Secondary | ICD-10-CM | POA: Diagnosis not present

## 2024-12-03 DIAGNOSIS — I252 Old myocardial infarction: Secondary | ICD-10-CM | POA: Diagnosis not present

## 2024-12-03 DIAGNOSIS — Z79899 Other long term (current) drug therapy: Secondary | ICD-10-CM | POA: Diagnosis not present

## 2024-12-03 DIAGNOSIS — Z4822 Encounter for aftercare following kidney transplant: Secondary | ICD-10-CM | POA: Diagnosis not present

## 2024-12-03 DIAGNOSIS — G4733 Obstructive sleep apnea (adult) (pediatric): Secondary | ICD-10-CM | POA: Diagnosis not present

## 2024-12-06 DIAGNOSIS — Z940 Kidney transplant status: Secondary | ICD-10-CM | POA: Diagnosis not present

## 2025-04-29 ENCOUNTER — Ambulatory Visit: Admitting: Cardiovascular Disease
